# Patient Record
Sex: Male | Born: 1945
Health system: Southern US, Community
[De-identification: ages and names within clinical notes are randomized; demographics above are authoritative.]

## PROBLEM LIST (undated history)

## (undated) DIAGNOSIS — E119 Type 2 diabetes mellitus without complications: Secondary | ICD-10-CM

## (undated) DIAGNOSIS — Z86010 Personal history of colonic polyps: Principal | ICD-10-CM

## (undated) DIAGNOSIS — G8929 Other chronic pain: Secondary | ICD-10-CM

## (undated) DIAGNOSIS — M5441 Lumbago with sciatica, right side: Secondary | ICD-10-CM

## (undated) DIAGNOSIS — C4491 Basal cell carcinoma of skin, unspecified: Secondary | ICD-10-CM

## (undated) DIAGNOSIS — I10 Essential (primary) hypertension: Secondary | ICD-10-CM

## (undated) DIAGNOSIS — G459 Transient cerebral ischemic attack, unspecified: Secondary | ICD-10-CM

## (undated) DIAGNOSIS — K219 Gastro-esophageal reflux disease without esophagitis: Secondary | ICD-10-CM

## (undated) DIAGNOSIS — D049 Carcinoma in situ of skin, unspecified: Secondary | ICD-10-CM

## (undated) DIAGNOSIS — Z9889 Other specified postprocedural states: Secondary | ICD-10-CM

## (undated) DIAGNOSIS — M5442 Lumbago with sciatica, left side: Secondary | ICD-10-CM

## (undated) HISTORY — DX: Type 2 diabetes mellitus without complications: E11.9

## (undated) HISTORY — DX: Lumbago with sciatica, left side: M54.42

## (undated) HISTORY — PX: TOTAL HIP ARTHROPLASTY: SHX124

## (undated) HISTORY — PX: BASAL CELL CARCINOMA EXCISION: SHX1214

## (undated) HISTORY — PX: ROTATOR CUFF REPAIR: SHX139

## (undated) HISTORY — DX: Carcinoma in situ of skin, unspecified: D04.9

## (undated) HISTORY — DX: Other chronic pain: G89.29

## (undated) HISTORY — DX: Lumbago with sciatica, right side: M54.41

## (undated) HISTORY — PX: POLYPECTOMY: SHX149

## (undated) HISTORY — DX: Basal cell carcinoma of skin, unspecified: C44.91

## (undated) HISTORY — DX: Transient cerebral ischemic attack, unspecified: G45.9

## (undated) HISTORY — DX: Essential (primary) hypertension: I10

## (undated) HISTORY — DX: Personal history of colonic polyps: Z86.010

## (undated) HISTORY — DX: Gastro-esophageal reflux disease without esophagitis: K21.9

## (undated) HISTORY — DX: Other specified postprocedural states: Z98.890

## (undated) HISTORY — PX: CARPAL TUNNEL RELEASE: SHX101

---
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
## 2005-06-13 ENCOUNTER — Ambulatory Visit: Payer: Self-pay | Admitting: Family Medicine

## 2007-07-12 ENCOUNTER — Ambulatory Visit: Payer: Self-pay | Admitting: Orthopaedic Surgery

## 2011-09-14 ENCOUNTER — Encounter: Payer: Self-pay | Admitting: Family Medicine

## 2011-09-14 ENCOUNTER — Ambulatory Visit (INDEPENDENT_AMBULATORY_CARE_PROVIDER_SITE_OTHER): Payer: Medicare Other | Admitting: Family Medicine

## 2011-09-14 VITALS — BP 120/68 | HR 87 | Temp 98.6°F | Ht 71.0 in | Wt 211.0 lb

## 2011-09-14 DIAGNOSIS — K219 Gastro-esophageal reflux disease without esophagitis: Secondary | ICD-10-CM

## 2011-09-14 DIAGNOSIS — Z9889 Other specified postprocedural states: Secondary | ICD-10-CM

## 2011-09-14 DIAGNOSIS — D049 Carcinoma in situ of skin, unspecified: Secondary | ICD-10-CM

## 2011-09-14 NOTE — Progress Notes (Signed)
Nature conservation officer at [facility] [address] [place] [address] Phone: [phone] Fax: [phone]  Date:  09/14/2011   Name:  Xavier Perry   DOB:  July 16, 1945   MRN:  [id number] Gender: male Age: 66 y.o.  PCP:  Hannah Beat, MD    Chief Complaint: Establish Care   History of Present Illness:  Xavier Perry is a 66 y.o. pleasant patient who presents with the following:  Left shoulder, s/p RTC and RTC revision.  GERD, stable and well controlled on Omeprazole  Remote h/o SCC of the skin  Patient Active Problem List  Diagnosis  . GERD (gastroesophageal reflux disease)  . History of repair of rotator cuff  . Squamous cell carcinoma in situ of skin    Past Medical History  Diagnosis Date  . GERD (gastroesophageal reflux disease) 09/19/2011  . History of repair of rotator cuff 09/19/2011    L 2009 and 2010  . Squamous cell carcinoma in situ of skin 09/19/2011    Past Surgical History  Procedure Date  . Rotator cuff repair 2009 and 2010    left rotator cuff in 2009 right in 2010  . Tonsilectomy, adenoidectomy, bilateral myringotomy and tubes 1967    History  Substance Use Topics  . Smoking status: Former Games developer  . Smokeless tobacco: Not on file  . Alcohol Use: Yes     occasional     No family history on file.  No Known Allergies  Current Outpatient Prescriptions on File Prior to Visit  Medication Sig Dispense Refill  . omeprazole (PRILOSEC) 20 MG capsule Take 20 mg by mouth daily.         Review of Systems:  Doesn't sleep all that well. Sometimes will wake up and get up five or six ocklock, will get five and a half hours o fseep a night. In the last month or so.     Physical Examination: Filed Vitals:   09/14/11 1407  BP: 120/68  Pulse: 87  Temp: 98.6 F (37 C)   Filed Vitals:   09/14/11 1407  Height: 5\' 11"  (1.803 m)  Weight: 211 lb (95.709 kg)   Body mass index is 29.43 kg/(m^2). Ideal Body Weight: Weight in (lb) to have BMI = 25:  178.9    GEN: WDWN, NAD, Non-toxic, A & O x 3 HEENT: Atraumatic, Normocephalic. Neck supple. No masses, No LAD. Ears and Nose: No external deformity. CV: RRR, No M/G/R. No JVD. No thrill. No extra heart sounds. PULM: CTA B, no wheezes, crackles, rhonchi. No retractions. No resp. distress. No accessory muscle use. EXTR: No c/c/e NEURO Normal gait.  PSYCH: Normally interactive. Conversant. Not depressed or anxious appearing.  Calm demeanor.    Assessment and Plan:  1. GERD (gastroesophageal reflux disease) , stable  2. History of repair of rotator cuff   3. Squamous cell carcinoma in situ of skin , stable    Orders Today:  No orders of the defined types were placed in this encounter.    Updated Medication List: (Includes new medications, updates to list, dose adjustments) Outpatient Encounter Prescriptions as of 09/14/2011  Medication Sig Dispense Refill  . aspirin 325 MG tablet Take 325 mg by mouth daily.      . Multiple Vitamin (MULTIVITAMIN) tablet Take 1 tablet by mouth daily.      . naproxen sodium (ANAPROX) 220 MG tablet Take 220 mg by mouth as needed.      Marland Kitchen omeprazole (PRILOSEC) 20 MG capsule Take 20 mg  by mouth daily.        Medications Discontinued: There are no discontinued medications.   Hannah Beat, MD

## 2011-09-19 ENCOUNTER — Encounter: Payer: Self-pay | Admitting: Family Medicine

## 2011-09-19 DIAGNOSIS — D049 Carcinoma in situ of skin, unspecified: Secondary | ICD-10-CM

## 2011-09-19 DIAGNOSIS — K219 Gastro-esophageal reflux disease without esophagitis: Secondary | ICD-10-CM | POA: Insufficient documentation

## 2011-09-19 DIAGNOSIS — Z9889 Other specified postprocedural states: Secondary | ICD-10-CM | POA: Insufficient documentation

## 2011-09-19 HISTORY — DX: Gastro-esophageal reflux disease without esophagitis: K21.9

## 2011-09-19 HISTORY — DX: Other specified postprocedural states: Z98.890

## 2011-09-19 HISTORY — DX: Carcinoma in situ of skin, unspecified: D04.9

## 2012-01-12 ENCOUNTER — Ambulatory Visit (INDEPENDENT_AMBULATORY_CARE_PROVIDER_SITE_OTHER): Payer: Medicare Other | Admitting: Family Medicine

## 2012-01-12 ENCOUNTER — Encounter: Payer: Self-pay | Admitting: Family Medicine

## 2012-01-12 ENCOUNTER — Ambulatory Visit (INDEPENDENT_AMBULATORY_CARE_PROVIDER_SITE_OTHER)
Admission: RE | Admit: 2012-01-12 | Discharge: 2012-01-12 | Disposition: A | Discharge status: Home / Self Care | Payer: Medicare Other | Source: Ambulatory Visit | Attending: Family Medicine | Admitting: Family Medicine

## 2012-01-12 VITALS — BP 140/80 | HR 95 | Temp 98.0°F | Ht 71.0 in | Wt 218.2 lb

## 2012-01-12 DIAGNOSIS — M25569 Pain in unspecified knee: Secondary | ICD-10-CM

## 2012-01-12 DIAGNOSIS — M25562 Pain in left knee: Secondary | ICD-10-CM

## 2012-01-12 DIAGNOSIS — M1710 Unilateral primary osteoarthritis, unspecified knee: Secondary | ICD-10-CM

## 2012-01-12 MED ORDER — DICLOFENAC SODIUM 75 MG PO TBEC: 75.0000 mg | DELAYED_RELEASE_TABLET | Freq: Two times a day (BID) | ORAL | Status: DC

## 2012-01-12 NOTE — Patient Instructions (Signed)
F/u with me (CPX) June 2014

## 2012-01-12 NOTE — Progress Notes (Signed)
Nature conservation officer at [facility] [address] [place] [address] Phone: [phone] Fax: [phone]  Date:  01/12/2012   Name:  Xavier Perry   DOB:  March 10, 1945   MRN:  [id number] Gender: male Age: 67 y.o.  PCP:  Hannah Beat, MD  Evaluating MD: Hannah Beat, MD   Chief Complaint: Knee Pain   History of Present Illness:  Xavier Perry is a 67 y.o. pleasant patient who presents with the following:  Left knee pain, started to bother him some in the middle part of knee Right knee, had an ACL tear and had some injection that made it better  3-4 week history of left-sided knee pain. He is a very active gentleman and works out routinely and he often also plays golf several times a week. Right now he is not been able to play. He has been taking some Aleve intermittently without much success. He has a mild effusion. A symptomatic giving way. No locking up of the joint. No prior operative intervention. No prior fractures. No no significant acute trauma or injury.  Patient Active Problem List  Diagnosis  . GERD (gastroesophageal reflux disease)  . History of repair of rotator cuff  . Squamous cell carcinoma in situ of skin    Past Medical History  Diagnosis Date  . GERD (gastroesophageal reflux disease) 09/19/2011  . History of repair of rotator cuff 09/19/2011    L 2009 and 2010  . Squamous cell carcinoma in situ of skin 09/19/2011    Past Surgical History  Procedure Date  . Rotator cuff repair 2009 and 2010    left rotator cuff in 2009 right in 2010  . Tonsilectomy, adenoidectomy, bilateral myringotomy and tubes 1967    History  Substance Use Topics  . Smoking status: Former Games developer  . Smokeless tobacco: Not on file  . Alcohol Use: Yes     Comment: occasional     No family history on file.  No Known Allergies  Medication list has been reviewed and updated.  Outpatient Prescriptions Prior to Visit  Medication Sig Dispense Refill  . aspirin 325 MG tablet  Take 325 mg by mouth daily.      . Multiple Vitamin (MULTIVITAMIN) tablet Take 1 tablet by mouth daily.      . naproxen sodium (ANAPROX) 220 MG tablet Take 220 mg by mouth as needed.      Marland Kitchen omeprazole (PRILOSEC) 20 MG capsule Take 20 mg by mouth daily.       Last reviewed on 01/12/2012  3:17 PM by Consuello Masse, CMA  Review of Systems:   GEN: No fevers, chills. Nontoxic. Primarily MSK c/o today. MSK: Detailed in the HPI GI: tolerating PO intake without difficulty Neuro: No numbness, parasthesias, or tingling associated. Otherwise the pertinent positives of the ROS are noted above.    Physical Examination: BP 140/80  Pulse 95  Temp 98 F (36.7 C) (Oral)  Ht 5\' 11"  (1.803 m)  Wt 218 lb 4 oz (98.998 kg)  BMI 30.44 kg/m2  SpO2 98%  Ideal Body Weight: Weight in (lb) to have BMI = 25: 178.9    GEN: WDWN, NAD, Non-toxic, Alert & Oriented x 3 HEENT: Atraumatic, Normocephalic.  Ears and Nose: No external deformity. EXTR: No clubbing/cyanosis/edema NEURO: Normal gait.  PSYCH: Normally interactive. Conversant. Not depressed or anxious appearing.  Calm demeanor.   LEFT knee: Full extension and flexion to 125. Mild tenderness with patellar compression and with medial lateral patellar facet loading. Mild  to moderate tenderness on the medial joint line. Stable varus and valgus stress. Negative Lachman. Negative anterior and posterior drawer testing. Negative McMurray's. Flexion pinch testing is negative, but mildly positive, meaning mildly tender. Bounce home test is negative.  Dg Knee Ap/lat W/sunrise Left  01/12/2012  *RADIOLOGY REPORT*  Clinical Data:  left knee pain  DG KNEE - 3 VIEWS  Comparison: None.  Findings: Three views of the left knee submitted.  No acute fracture or subluxation.  Minimal spurring of femoral condyles.  No joint effusion.  Narrowing of patellofemoral joint space. Small area focal sclerosis in proximal tibia may be due to a bone island or prior avascular necrosis.   IMPRESSION:  No acute fracture or subluxation.  Minimal spurring of femoral condyles.  No joint effusion.  Narrowing of patellofemoral joint space. Small area focal sclerosis in proximal tibia may be due to a bone island or prior avascular necrosis.   Original Report Authenticated By: Natasha Mead, M.D.     Assessment and Plan:  1. Osteoarthritis, knee    2. Left knee pain  DG Knee AP/LAT W/Sunrise Left   X-rays: AP Bilateral Weight-bearing, Weightbearing Lateral, Sunrise views Indication: knee pain Findings:  Minimal to mild tricompartmental osteoarthritic change with some minimal degree of spurring. Overall, joint spaces are relatively well preserved. No evidence of occult fracture.  Probable OA exacerbation on the LEFT. Failure with some initial NSAIDs. We will change classes and start some Voltaren orally, ice after playing golf and inject knee now.  Knee Injection, LEFT Patient verbally consented to procedure. Risks (including potential rare risk of infection), benefits, and alternatives explained. Sterilely prepped with Chloraprep. Ethyl cholride used for anesthesia. 8 cc Lidocaine 1% mixed with 2 cc of Depo-Medrol 40 mg injected using the anterolateral approach without difficulty. No complications with procedure and tolerated well. Patient had decreased pain post-injection.   Orders Today:  Orders Placed This Encounter  Procedures  . DG Knee AP/LAT W/Sunrise Left    Standing Status: Future     Number of Occurrences: 1     Standing Expiration Date: 03/13/2013    Order Specific Question:  Reason for exam:    Answer:  left knee pain, OA?    Order Specific Question:  Preferred imaging location?    Answer:  [facility]    Updated Medication List: (Includes new medications, updates to list, dose adjustments) Meds ordered this encounter  Medications  . diclofenac (VOLTAREN) 75 MG EC tablet    Sig: Take 1 tablet (75 mg total) by mouth 2 (two) times daily.    Dispense:  60  tablet    Refill:  3    Medications Discontinued: There are no discontinued medications.   Hannah Beat, MD

## 2012-07-09 ENCOUNTER — Other Ambulatory Visit: Payer: Self-pay | Admitting: Family Medicine

## 2012-07-09 DIAGNOSIS — Z13220 Encounter for screening for lipoid disorders: Secondary | ICD-10-CM

## 2012-07-09 DIAGNOSIS — N400 Enlarged prostate without lower urinary tract symptoms: Secondary | ICD-10-CM

## 2012-07-09 DIAGNOSIS — Z79899 Other long term (current) drug therapy: Secondary | ICD-10-CM

## 2012-07-10 ENCOUNTER — Other Ambulatory Visit (INDEPENDENT_AMBULATORY_CARE_PROVIDER_SITE_OTHER): Payer: Medicare Other

## 2012-07-10 DIAGNOSIS — N400 Enlarged prostate without lower urinary tract symptoms: Secondary | ICD-10-CM

## 2012-07-10 DIAGNOSIS — Z125 Encounter for screening for malignant neoplasm of prostate: Secondary | ICD-10-CM

## 2012-07-10 DIAGNOSIS — Z79899 Other long term (current) drug therapy: Secondary | ICD-10-CM

## 2012-07-10 DIAGNOSIS — Z13220 Encounter for screening for lipoid disorders: Secondary | ICD-10-CM

## 2012-07-10 LAB — CBC WITH DIFFERENTIAL/PLATELET
Eosinophils Relative: 3.2 % (ref 0.0–5.0)
HCT: 44 % (ref 39.0–52.0)
Hemoglobin: 14.8 g/dL (ref 13.0–17.0)
Lymphs Abs: 2.3 10*3/uL (ref 0.7–4.0)
Monocytes Relative: 8.6 % (ref 3.0–12.0)
Neutro Abs: 4.1 10*3/uL (ref 1.4–7.7)
RBC: 4.83 Mil/uL (ref 4.22–5.81)
WBC: 7.4 10*3/uL (ref 4.5–10.5)

## 2012-07-10 LAB — LIPID PANEL
Cholesterol: 213 mg/dL — ABNORMAL HIGH (ref 0–200)
HDL: 56.7 mg/dL (ref >39.00)
Total CHOL/HDL Ratio: 4
Triglycerides: 182 mg/dL — ABNORMAL HIGH (ref 0.0–149.0)
VLDL: 36.4 mg/dL (ref 0.0–40.0)

## 2012-07-10 LAB — BASIC METABOLIC PANEL
CO2: 27 mEq/L (ref 19–32)
Calcium: 9.6 mg/dL (ref 8.4–10.5)
Chloride: 105 mEq/L (ref 96–112)
Creatinine, Ser: 0.9 mg/dL (ref 0.4–1.5)
Glucose, Bld: 111 mg/dL — ABNORMAL HIGH (ref 70–99)

## 2012-07-10 LAB — HEPATIC FUNCTION PANEL: Albumin: 4.1 g/dL (ref 3.5–5.2)

## 2012-07-18 ENCOUNTER — Encounter: Payer: Self-pay | Admitting: Family Medicine

## 2012-07-18 ENCOUNTER — Ambulatory Visit (INDEPENDENT_AMBULATORY_CARE_PROVIDER_SITE_OTHER): Payer: Medicare Other | Admitting: Family Medicine

## 2012-07-18 ENCOUNTER — Encounter: Payer: Self-pay | Admitting: Internal Medicine

## 2012-07-18 VITALS — BP 120/82 | HR 91 | Temp 98.2°F | Ht 71.0 in | Wt 208.0 lb

## 2012-07-18 DIAGNOSIS — Z0000 Encounter for general adult medical examination without abnormal findings: Secondary | ICD-10-CM

## 2012-07-18 DIAGNOSIS — Z1211 Encounter for screening for malignant neoplasm of colon: Secondary | ICD-10-CM

## 2012-07-18 DIAGNOSIS — Z2911 Encounter for prophylactic immunotherapy for respiratory syncytial virus (RSV): Secondary | ICD-10-CM

## 2012-07-18 DIAGNOSIS — Z23 Encounter for immunization: Secondary | ICD-10-CM

## 2012-07-18 MED ORDER — OMEPRAZOLE 20 MG PO CPDR: 20.0000 mg | DELAYED_RELEASE_CAPSULE | Freq: Every day | ORAL | Status: DC

## 2012-07-18 NOTE — Progress Notes (Signed)
Nature conservation officer at [facility] [address] [place] [address] Phone: [phone] Fax: [phone]  Date:  07/18/2012   Name:  Xavier Perry   DOB:  1945/04/13   MRN:  [id number] Gender: male Age: 67 y.o.  Primary Physician:  Hannah Beat, MD  Evaluating MD: Hannah Beat, MD   Chief Complaint: Annual Exam   History of Present Illness:  Xavier Perry is a 67 y.o. pleasant patient who presents with the following:  Medicare CPX:  Pneumovax Zostavax  Basically healthy and feeling well.  Preventative Health Maintenance Visit:  Health Maintenance Summary Reviewed and updated, unless pt declines services.  Tobacco History Reviewed. Alcohol: No concerns, no excessive use Exercise Habits: Some activity, rec at least 30 mins 5 times a week STD concerns: no risk or activity to increase risk Drug Use: None Encouraged self-testicular check  Health Maintenance  Topic Date Due  . Tetanus/tdap  08/09/1965  . Colonoscopy  08/11/2012  . Influenza Vaccine  09/03/2012  . Pneumococcal Polysaccharide Vaccine Age 17 And Over  Completed  . Zostavax  Completed    Labs reviewed with the patient.  Results for orders placed in visit on 07/10/12  LIPID PANEL      Result Value Range   Cholesterol 213 (*) 0 - 200 mg/dL   Triglycerides 010.2 (*) 0.0 - 149.0 mg/dL   HDL 72.53  >66.44 mg/dL   VLDL 03.4  0.0 - 74.2 mg/dL   Total CHOL/HDL Ratio 4    HEPATIC FUNCTION PANEL      Result Value Range   Total Bilirubin 0.7  0.3 - 1.2 mg/dL   Bilirubin, Direct 0.1  0.0 - 0.3 mg/dL   Alkaline Phosphatase 118 (*) 39 - 117 U/L   AST 29  0 - 37 U/L   ALT 35  0 - 53 U/L   Total Protein 7.1  6.0 - 8.3 g/dL   Albumin 4.1  3.5 - 5.2 g/dL  CBC WITH DIFFERENTIAL      Result Value Range   WBC 7.4  4.5 - 10.5 K/uL   RBC 4.83  4.22 - 5.81 Mil/uL   Hemoglobin 14.8  13.0 - 17.0 g/dL   HCT 59.5  63.8 - 75.6 %   MCV 91.3  78.0 - 100.0 fl   MCHC 33.6  30.0 - 36.0 g/dL   RDW 43.3  29.5 -  18.8 %   Platelets 250.0  150.0 - 400.0 K/uL   Neutrophils Relative % 55.8  43.0 - 77.0 %   Lymphocytes Relative 31.3  12.0 - 46.0 %   Monocytes Relative 8.6  3.0 - 12.0 %   Eosinophils Relative 3.2  0.0 - 5.0 %   Basophils Relative 1.1  0.0 - 3.0 %   Neutro Abs 4.1  1.4 - 7.7 K/uL   Lymphs Abs 2.3  0.7 - 4.0 K/uL   Monocytes Absolute 0.6  0.1 - 1.0 K/uL   Eosinophils Absolute 0.2  0.0 - 0.7 K/uL   Basophils Absolute 0.1  0.0 - 0.1 K/uL  BASIC METABOLIC PANEL      Result Value Range   Sodium 140  135 - 145 mEq/L   Potassium 5.2 (*) 3.5 - 5.1 mEq/L   Chloride 105  96 - 112 mEq/L   CO2 27  19 - 32 mEq/L   Glucose, Bld 111 (*) 70 - 99 mg/dL   BUN 15  6 - 23 mg/dL   Creatinine, Ser 0.9  0.4 - 1.5 mg/dL  Calcium 9.6  8.4 - 10.5 mg/dL   GFR 62.13  >08.65 mL/min  PSA, MEDICARE      Result Value Range   PSA 0.29  0.10 - 4.00 ng/ml  LDL CHOLESTEROL, DIRECT      Result Value Range   Direct LDL 150.0       Patient Active Problem List   Diagnosis Date Noted  . GERD (gastroesophageal reflux disease) 09/19/2011  . History of repair of rotator cuff 09/19/2011  . Squamous cell carcinoma in situ of skin 09/19/2011    Past Medical History  Diagnosis Date  . GERD (gastroesophageal reflux disease) 09/19/2011  . History of repair of rotator cuff 09/19/2011    L 2009 and 2010  . Squamous cell carcinoma in situ of skin 09/19/2011    Past Surgical History  Procedure Laterality Date  . Rotator cuff repair  2009 and 2010    left rotator cuff in 2009 right in 2010  . Tonsilectomy, adenoidectomy, bilateral myringotomy and tubes  1967    History   Social History  . Marital Status: Married    Spouse Name: N/A    Number of Children: N/A  . Years of Education: N/A   Occupational History  . Not on file.   Social History Main Topics  . Smoking status: Former Games developer  . Smokeless tobacco: Never Used  . Alcohol Use: Yes     Comment: occasional   . Drug Use: No  . Sexually Active: Not  on file   Other Topics Concern  . Not on file   Social History Narrative  . No narrative on file    No family history on file.  No Known Allergies  Medication list has been reviewed and updated.  Outpatient Prescriptions Prior to Visit  Medication Sig Dispense Refill  . omeprazole (PRILOSEC) 20 MG capsule Take 20 mg by mouth daily.      Marland Kitchen aspirin 325 MG tablet Take 325 mg by mouth daily.      . diclofenac (VOLTAREN) 75 MG EC tablet Take 1 tablet (75 mg total) by mouth 2 (two) times daily.  60 tablet  3  . Multiple Vitamin (MULTIVITAMIN) tablet Take 1 tablet by mouth daily.      . naproxen sodium (ANAPROX) 220 MG tablet Take 220 mg by mouth as needed.       No facility-administered medications prior to visit.    Review of Systems:   General: Denies fever, chills, sweats. No significant weight loss. Eyes: Denies blurring,significant itching ENT: Denies earache, sore throat, and hoarseness. Cardiovascular: Denies chest pains, palpitations, dyspnea on exertion Respiratory: Denies cough, dyspnea at rest,wheeezing Breast: no concerns about lumps GI: Denies nausea, vomiting, diarrhea, constipation, change in bowel habits, abdominal pain, melena, hematochezia GU: Denies penile discharge, ED, urinary flow / outflow problems. No STD concerns. Musculoskeletal: Denies back pain, joint pain Derm: Denies rash, itching Neuro: Denies  paresthesias, frequent falls, frequent headaches Psych: Denies depression, anxiety Endocrine: Denies cold intolerance, heat intolerance, polydipsia Heme: Denies enlarged lymph nodes Allergy: No hayfever  Physical Examination: BP 120/82  Pulse 91  Temp(Src) 98.2 F (36.8 C) (Oral)  Ht 5\' 11"  (1.803 m)  Wt 208 lb (94.348 kg)  BMI 29.02 kg/m2  SpO2 97%  Ideal Body Weight: Weight in (lb) to have BMI = 25: 178.9   Wt Readings from Last 3 Encounters:  07/18/12 208 lb (94.348 kg)  01/12/12 218 lb 4 oz (98.998 kg)  09/14/11 211 lb (95.709 kg)  GEN: well developed, well nourished, no acute distress Eyes: conjunctiva and lids normal, PERRLA, EOMI ENT: TM clear, nares clear, oral exam WNL Neck: supple, no lymphadenopathy, no thyromegaly, no JVD Pulm: clear to auscultation and percussion, respiratory effort normal CV: regular rate and rhythm, S1-S2, no murmur, rub or gallop, no bruits, peripheral pulses normal and symmetric, no cyanosis, clubbing, edema or varicosities Chest: no scars, masses GI: soft, non-tender; no hepatosplenomegaly, masses; active bowel sounds all quadrants GU: no hernia, testicular mass, penile discharge, or prostate enlargement Lymph: no cervical, axillary or inguinal adenopathy MSK: gait normal, muscle tone and strength WNL, no joint swelling, effusions, discoloration, crepitus  SKIN: clear, good turgor, color WNL, no rashes, lesions, or ulcerations Neuro: normal mental status, normal strength, sensation, and motion Psych: alert; oriented to person, place and time, normally interactive and not anxious or depressed in appearance.  Assessment and Plan:  Routine general medical examination at a health care facility  Special screening for malignant neoplasms, colon - Plan: Ambulatory referral to Gastroenterology  Immunization due - Plan: Varicella-zoster vaccine subcutaneous, Pneumococcal polysaccharide vaccine 23-valent greater than or equal to 2yo subcutaneous/IM  I have personally reviewed the Medicare Annual Wellness questionnaire and have noted 1. The patient's medical and social history 2. Their use of alcohol, tobacco or illicit drugs 3. Their current medications and supplements 4. The patient's functional ability including ADL's, fall risks, home safety risks and hearing or visual             impairment. 5. Diet and physical activities 6. Evidence for depression or mood disorders  The patients weight, height, BMI and visual acuity have been recorded in the chart I have made referrals,  counseling and provided education to the patient based review of the above and I have provided the pt with a written personalized care plan for preventive services.  I have provided the patient with a copy of your personalized plan for preventive services. Instructed to take the time to review along with their updated medication list.   Update vaccines Refer for colon  Orders Today:  Orders Placed This Encounter  Procedures  . Varicella-zoster vaccine subcutaneous  . Pneumococcal polysaccharide vaccine 23-valent greater than or equal to 2yo subcutaneous/IM  . Ambulatory referral to Gastroenterology    Referral Priority:  Routine    Referral Type:  Consultation    Referral Reason:  Specialty Services Required    Requested Specialty:  Gastroenterology    Number of Visits Requested:  1    Updated Medication List: (Includes new medications, updates to list, dose adjustments) Meds ordered this encounter  Medications  . omeprazole (PRILOSEC) 20 MG capsule    Sig: Take 1 capsule (20 mg total) by mouth daily.    Dispense:  90 capsule    Refill:  3    Medications Discontinued: Medications Discontinued During This Encounter  Medication Reason  . naproxen sodium (ANAPROX) 220 MG tablet Error  . Multiple Vitamin (MULTIVITAMIN) tablet Error  . diclofenac (VOLTAREN) 75 MG EC tablet Error  . aspirin 325 MG tablet Error  . omeprazole (PRILOSEC) 20 MG capsule Reorder      Signed, Karleen Hampshire T. Namari Breton, MD 07/18/2012 2:56 PM

## 2012-07-18 NOTE — Patient Instructions (Addendum)
REFERRAL: GO THE THE FRONT ROOM AT THE ENTRANCE OF OUR CLINIC, NEAR CHECK IN. ASK FOR Xavier Perry. SHE WILL HELP YOU SET UP YOUR REFERRAL. DATE: TIME:  

## 2012-08-23 ENCOUNTER — Telehealth: Payer: Self-pay | Admitting: *Deleted

## 2012-08-23 ENCOUNTER — Ambulatory Visit (AMBULATORY_SURGERY_CENTER): Payer: Medicare Other | Admitting: *Deleted

## 2012-08-23 VITALS — Ht 71.0 in | Wt 207.8 lb

## 2012-08-23 DIAGNOSIS — Z86010 Personal history of colonic polyps: Secondary | ICD-10-CM

## 2012-08-23 MED ORDER — PREPOPIK 10-3.5-12 MG-GM-GM PO PACK: 1.0000 | PACK | Freq: Once | ORAL | Status: DC

## 2012-08-23 NOTE — Telephone Encounter (Signed)
Dr. Leone Payor: pt scheduled for colonoscopy Thursday 8/28.  Last colonoscopy 08/03/2010 at [facility].  Pt brought reports  for review.  Pt had numerous polyps; adenomatous and hyperplastic.  Recall suggested 1 to 2 years.  I have placed reports on your desk for review. Is pt due now?  Thanks, Olegario Messier.

## 2012-08-23 NOTE — Progress Notes (Signed)
No allergies to eggs or soy. No problems with anesthesia.  

## 2012-08-23 NOTE — Telephone Encounter (Signed)
Colonoscopy now is appropriate

## 2012-08-24 NOTE — Telephone Encounter (Signed)
LMOM for pt to keep his colonoscopy as scheduled- ok on contact info to leave message on machine

## 2012-08-24 NOTE — Telephone Encounter (Signed)
noted 

## 2012-08-30 ENCOUNTER — Ambulatory Visit (AMBULATORY_SURGERY_CENTER): Payer: Medicare Other | Admitting: Internal Medicine

## 2012-08-30 ENCOUNTER — Encounter: Payer: Self-pay | Admitting: Internal Medicine

## 2012-08-30 VITALS — BP 111/56 | HR 56 | Temp 97.6°F | Resp 19 | Ht 71.0 in | Wt 207.0 lb

## 2012-08-30 DIAGNOSIS — D126 Benign neoplasm of colon, unspecified: Secondary | ICD-10-CM

## 2012-08-30 DIAGNOSIS — Z860100 Personal history of colon polyps, unspecified: Secondary | ICD-10-CM

## 2012-08-30 HISTORY — DX: Personal history of colon polyps, unspecified: Z86.0100

## 2012-08-30 HISTORY — PX: COLONOSCOPY: SHX174

## 2012-08-30 HISTORY — DX: Personal history of colonic polyps: Z86.010

## 2012-08-30 MED ORDER — SODIUM CHLORIDE 0.9 % IV SOLN: 500.0000 mL | INTRAVENOUS | Status: DC

## 2012-08-30 NOTE — Progress Notes (Signed)
Called to room to assist during endoscopic procedure.  Patient ID and intended procedure confirmed with present staff. Received instructions for my participation in the procedure from the performing physician.  

## 2012-08-30 NOTE — Progress Notes (Signed)
Procedure ends, to recovery, report given and VSS. 

## 2012-08-30 NOTE — Patient Instructions (Addendum)
YOU HAD AN ENDOSCOPIC PROCEDURE TODAY AT THE [facility]: Refer to the procedure report that was given to you for any specific questions about what was found during the examination.  If the procedure report does not answer your questions, please call your gastroenterologist to clarify.  If you requested that your care partner not be given the details of your procedure findings, then the procedure report has been included in a sealed envelope for you to review at your convenience later.  YOU SHOULD EXPECT: Some feelings of bloating in the abdomen. Passage of more gas than usual.  Walking can help get rid of the air that was put into your GI tract during the procedure and reduce the bloating. If you had a lower endoscopy (such as a colonoscopy or flexible sigmoidoscopy) you may notice spotting of blood in your stool or on the toilet paper. If you underwent a bowel prep for your procedure, then you may not have a normal bowel movement for a few days.  DIET: Your first meal following the procedure should be a light meal and then it is ok to progress to your normal diet.  A half-sandwich or bowl of soup is an example of a good first meal.  Heavy or fried foods are harder to digest and may make you feel nauseous or bloated.  Likewise meals heavy in dairy and vegetables can cause extra gas to form and this can also increase the bloating.  Drink plenty of fluids but you should avoid alcoholic beverages for 24 hours.  ACTIVITY: Your care partner should take you home directly after the procedure.  You should plan to take it easy, moving slowly for the rest of the day.  You can resume normal activity the day after the procedure however you should NOT DRIVE or use heavy machinery for 24 hours (because of the sedation medicines used during the test).    SYMPTOMS TO REPORT IMMEDIATELY: A gastroenterologist can be reached at any hour.  During normal business hours, 8:30 AM to 5:00 PM Monday through Friday,  call [phone].  After hours and on weekends, please call the GI answering service at [phone] who will take a message and have the physician on call contact you.   Following lower endoscopy (colonoscopy or flexible sigmoidoscopy):  Excessive amounts of blood in the stool  Significant tenderness or worsening of abdominal pains  Swelling of the abdomen that is new, acute  Fever of 100F or higher    FOLLOW UP: If any biopsies were taken you will be contacted by phone or by letter within the next 1-3 weeks.  Call your gastroenterologist if you have not heard about the biopsies in 3 weeks.  Our staff will call the home number listed on your records the next business day following your procedure to check on you and address any questions or concerns that you may have at that time regarding the information given to you following your procedure. This is a courtesy call and so if there is no answer at the home number and we have not heard from you through the emergency physician on call, we will assume that you have returned to your regular daily activities without incident.  SIGNATURES/CONFIDENTIALITY: You and/or your care partner have signed paperwork which will be entered into your electronic medical record.  These signatures attest to the fact that that the information above on your After Visit Summary has been reviewed and is understood.  Full responsibility of the confidentiality   of this discharge information lies with you and/or your care-partner.     

## 2012-08-30 NOTE — Progress Notes (Addendum)
Patient did not have preoperative order for IV antibiotic SSI prophylaxis. (G8918)  Patient did not experience any of the following events: a burn prior to discharge; a fall within the facility; wrong site/side/patient/procedure/implant event; or a hospital transfer or hospital admission upon discharge from the facility. (G8907)  

## 2012-08-30 NOTE — Op Note (Signed)
 [facility] [address] [place], [address]   COLONOSCOPY PROCEDURE REPORT  PATIENT: Xavier Perry, Xavier Perry  MR#: [id number] BIRTHDATE: Aug 31, 1945 , 67  yrs. old GENDER: Male ENDOSCOPIST: Iva Boop, MD, [facility] REFERRED XB:[id number] Ward Chatters, M.D. PROCEDURE DATE:  08/30/2012 PROCEDURE:   Colonoscopy with snare polypectomy First Screening Colonoscopy - Avg.  risk and is 50 yrs.  old or older - No.  Prior Negative Screening - Now for repeat screening. N/A  History of Adenoma - Now for follow-up colonoscopy & has been > or = to 3 yrs.  No.  It has been less than 3 yrs since last colonoscopy.  Medical reason.  Polyps Removed Today? Yes. ASA CLASS:   Class II INDICATIONS:Patient's personal history of adenomatous colon polyps. 24 polyps, > 10 were adenomas in 2012 MEDICATIONS: Propofol (Diprivan) 270 mg IV, MAC sedation, administered by CRNA, and These medications were titrated to patient response per physician's verbal order  DESCRIPTION OF PROCEDURE:   After the risks benefits and alternatives of the procedure were thoroughly explained, informed consent was obtained.  A digital rectal exam revealed no abnormalities of the rectum, A digital rectal exam revealed the prostate was not enlarged, and A digital rectal exam revealed no prostatic nodules.   The LB [id number]  endoscope was introduced through the anus and advanced to the cecum, which was identified by both the appendix and ileocecal valve. No adverse events experienced.   The quality of the prep was Prepopik good The instrument was then slowly withdrawn as the colon was fully examined.   COLON FINDINGS: A sessile polyp measuring 6 mm in size was found in the ascending colon.  A polypectomy was performed with a cold snare.  The resection was complete and the polyp tissue was completely retrieved.   Moderate diverticulosis was noted in the sigmoid colon.   The colon mucosa was otherwise normal.   A  right colon retroflexion was performed.  Retroflexed views revealed no abnormalities. The time to cecum=1 minutes 15 seconds.  Withdrawal time=7 minutes 58 seconds.  The scope was withdrawn and the procedure completed. COMPLICATIONS: There were no complications.  ENDOSCOPIC IMPRESSION: 1.   Sessile polyp measuring 6 mm in size was found in the ascending colon; polypectomy was performed with a cold snare 2.   Moderate diverticulosis was noted in the sigmoid colon 3.   The colon mucosa was otherwise normal - good prep - hx numerous >10 adenomas 2012  RECOMMENDATIONS: Timing of repeat colonoscopy will be determined by pathology findings.   eSigned:  Iva Boop, MD, [facility] 08/30/2012 2:16 PM  cc: Juleen China, MD and The Patient

## 2012-08-31 ENCOUNTER — Telehealth: Payer: Self-pay

## 2012-08-31 NOTE — Telephone Encounter (Signed)
Left message for follow up call. 

## 2012-09-07 ENCOUNTER — Encounter: Payer: Self-pay | Admitting: Internal Medicine

## 2012-09-07 NOTE — Progress Notes (Signed)
Quick Note:  6 mm sessile serrated adenoma Hx numerous polyps 2012 Repeat colonoscopy 2016 ______

## 2013-01-07 DIAGNOSIS — M5137 Other intervertebral disc degeneration, lumbosacral region: Secondary | ICD-10-CM | POA: Diagnosis not present

## 2013-01-07 DIAGNOSIS — M5430 Sciatica, unspecified side: Secondary | ICD-10-CM | POA: Diagnosis not present

## 2013-01-07 DIAGNOSIS — M999 Biomechanical lesion, unspecified: Secondary | ICD-10-CM | POA: Diagnosis not present

## 2013-01-08 DIAGNOSIS — M999 Biomechanical lesion, unspecified: Secondary | ICD-10-CM | POA: Diagnosis not present

## 2013-01-08 DIAGNOSIS — M5430 Sciatica, unspecified side: Secondary | ICD-10-CM | POA: Diagnosis not present

## 2013-01-08 DIAGNOSIS — M5137 Other intervertebral disc degeneration, lumbosacral region: Secondary | ICD-10-CM | POA: Diagnosis not present

## 2013-01-10 DIAGNOSIS — M5137 Other intervertebral disc degeneration, lumbosacral region: Secondary | ICD-10-CM | POA: Diagnosis not present

## 2013-01-10 DIAGNOSIS — M5430 Sciatica, unspecified side: Secondary | ICD-10-CM | POA: Diagnosis not present

## 2013-01-10 DIAGNOSIS — M999 Biomechanical lesion, unspecified: Secondary | ICD-10-CM | POA: Diagnosis not present

## 2013-01-15 DIAGNOSIS — M5430 Sciatica, unspecified side: Secondary | ICD-10-CM | POA: Diagnosis not present

## 2013-01-15 DIAGNOSIS — M999 Biomechanical lesion, unspecified: Secondary | ICD-10-CM | POA: Diagnosis not present

## 2013-01-15 DIAGNOSIS — M5137 Other intervertebral disc degeneration, lumbosacral region: Secondary | ICD-10-CM | POA: Diagnosis not present

## 2013-01-17 DIAGNOSIS — M5430 Sciatica, unspecified side: Secondary | ICD-10-CM | POA: Diagnosis not present

## 2013-01-17 DIAGNOSIS — M5137 Other intervertebral disc degeneration, lumbosacral region: Secondary | ICD-10-CM | POA: Diagnosis not present

## 2013-01-17 DIAGNOSIS — M999 Biomechanical lesion, unspecified: Secondary | ICD-10-CM | POA: Diagnosis not present

## 2013-01-22 DIAGNOSIS — M5137 Other intervertebral disc degeneration, lumbosacral region: Secondary | ICD-10-CM | POA: Diagnosis not present

## 2013-01-22 DIAGNOSIS — M999 Biomechanical lesion, unspecified: Secondary | ICD-10-CM | POA: Diagnosis not present

## 2013-01-22 DIAGNOSIS — M5430 Sciatica, unspecified side: Secondary | ICD-10-CM | POA: Diagnosis not present

## 2013-01-24 DIAGNOSIS — M5137 Other intervertebral disc degeneration, lumbosacral region: Secondary | ICD-10-CM | POA: Diagnosis not present

## 2013-01-24 DIAGNOSIS — M999 Biomechanical lesion, unspecified: Secondary | ICD-10-CM | POA: Diagnosis not present

## 2013-01-24 DIAGNOSIS — M5430 Sciatica, unspecified side: Secondary | ICD-10-CM | POA: Diagnosis not present

## 2013-01-29 DIAGNOSIS — M999 Biomechanical lesion, unspecified: Secondary | ICD-10-CM | POA: Diagnosis not present

## 2013-01-29 DIAGNOSIS — M5430 Sciatica, unspecified side: Secondary | ICD-10-CM | POA: Diagnosis not present

## 2013-01-29 DIAGNOSIS — M5137 Other intervertebral disc degeneration, lumbosacral region: Secondary | ICD-10-CM | POA: Diagnosis not present

## 2013-01-31 DIAGNOSIS — M5137 Other intervertebral disc degeneration, lumbosacral region: Secondary | ICD-10-CM | POA: Diagnosis not present

## 2013-01-31 DIAGNOSIS — M5430 Sciatica, unspecified side: Secondary | ICD-10-CM | POA: Diagnosis not present

## 2013-01-31 DIAGNOSIS — M999 Biomechanical lesion, unspecified: Secondary | ICD-10-CM | POA: Diagnosis not present

## 2013-02-05 DIAGNOSIS — M5430 Sciatica, unspecified side: Secondary | ICD-10-CM | POA: Diagnosis not present

## 2013-02-05 DIAGNOSIS — M999 Biomechanical lesion, unspecified: Secondary | ICD-10-CM | POA: Diagnosis not present

## 2013-02-05 DIAGNOSIS — M5137 Other intervertebral disc degeneration, lumbosacral region: Secondary | ICD-10-CM | POA: Diagnosis not present

## 2013-02-07 DIAGNOSIS — M5430 Sciatica, unspecified side: Secondary | ICD-10-CM | POA: Diagnosis not present

## 2013-02-07 DIAGNOSIS — M999 Biomechanical lesion, unspecified: Secondary | ICD-10-CM | POA: Diagnosis not present

## 2013-02-07 DIAGNOSIS — M5137 Other intervertebral disc degeneration, lumbosacral region: Secondary | ICD-10-CM | POA: Diagnosis not present

## 2013-02-14 DIAGNOSIS — J019 Acute sinusitis, unspecified: Secondary | ICD-10-CM | POA: Diagnosis not present

## 2013-03-01 DIAGNOSIS — J019 Acute sinusitis, unspecified: Secondary | ICD-10-CM | POA: Diagnosis not present

## 2013-06-18 DIAGNOSIS — L570 Actinic keratosis: Secondary | ICD-10-CM | POA: Diagnosis not present

## 2013-06-18 DIAGNOSIS — Z1283 Encounter for screening for malignant neoplasm of skin: Secondary | ICD-10-CM | POA: Diagnosis not present

## 2013-06-18 DIAGNOSIS — Z85828 Personal history of other malignant neoplasm of skin: Secondary | ICD-10-CM | POA: Diagnosis not present

## 2013-07-04 ENCOUNTER — Other Ambulatory Visit: Payer: Self-pay | Admitting: Family Medicine

## 2013-07-04 DIAGNOSIS — Z79899 Other long term (current) drug therapy: Secondary | ICD-10-CM

## 2013-07-04 DIAGNOSIS — Z125 Encounter for screening for malignant neoplasm of prostate: Secondary | ICD-10-CM

## 2013-07-04 DIAGNOSIS — E7800 Pure hypercholesterolemia, unspecified: Secondary | ICD-10-CM

## 2013-07-04 DIAGNOSIS — Z13220 Encounter for screening for lipoid disorders: Secondary | ICD-10-CM

## 2013-07-11 ENCOUNTER — Ambulatory Visit: Payer: Medicare Other

## 2013-07-11 ENCOUNTER — Other Ambulatory Visit (INDEPENDENT_AMBULATORY_CARE_PROVIDER_SITE_OTHER): Payer: Medicare Other

## 2013-07-11 DIAGNOSIS — Z125 Encounter for screening for malignant neoplasm of prostate: Secondary | ICD-10-CM | POA: Diagnosis not present

## 2013-07-11 DIAGNOSIS — E7800 Pure hypercholesterolemia, unspecified: Secondary | ICD-10-CM

## 2013-07-11 DIAGNOSIS — Z79899 Other long term (current) drug therapy: Secondary | ICD-10-CM

## 2013-07-11 DIAGNOSIS — Z13220 Encounter for screening for lipoid disorders: Secondary | ICD-10-CM

## 2013-07-11 DIAGNOSIS — R7309 Other abnormal glucose: Secondary | ICD-10-CM

## 2013-07-11 LAB — CBC WITH DIFFERENTIAL/PLATELET
BASOS ABS: 0.1 10*3/uL (ref 0.0–0.1)
Basophils Relative: 0.6 % (ref 0.0–3.0)
Eosinophils Absolute: 0.2 10*3/uL (ref 0.0–0.7)
Eosinophils Relative: 2.5 % (ref 0.0–5.0)
HEMATOCRIT: 44.6 % (ref 39.0–52.0)
HEMOGLOBIN: 15 g/dL (ref 13.0–17.0)
LYMPHS ABS: 2.4 10*3/uL (ref 0.7–4.0)
Lymphocytes Relative: 27.2 % (ref 12.0–46.0)
MCHC: 33.7 g/dL (ref 30.0–36.0)
MCV: 90.2 fl (ref 78.0–100.0)
MONO ABS: 0.6 10*3/uL (ref 0.1–1.0)
Monocytes Relative: 6.8 % (ref 3.0–12.0)
NEUTROS ABS: 5.6 10*3/uL (ref 1.4–7.7)
Neutrophils Relative %: 62.9 % (ref 43.0–77.0)
PLATELETS: 249 10*3/uL (ref 150.0–400.0)
RBC: 4.94 Mil/uL (ref 4.22–5.81)
RDW: 13.3 % (ref 11.5–15.5)
WBC: 8.9 10*3/uL (ref 4.0–10.5)

## 2013-07-11 LAB — LIPID PANEL
Cholesterol: 211 mg/dL — ABNORMAL HIGH (ref 0–200)
HDL: 64.6 mg/dL (ref >39.00)
LDL Cholesterol: 128 mg/dL — ABNORMAL HIGH (ref 0–99)
NONHDL: 146.4
Total CHOL/HDL Ratio: 3
Triglycerides: 91 mg/dL (ref 0.0–149.0)
VLDL: 18.2 mg/dL (ref 0.0–40.0)

## 2013-07-11 LAB — BASIC METABOLIC PANEL
BUN: 13 mg/dL (ref 6–23)
CO2: 23 mEq/L (ref 19–32)
CREATININE: 0.9 mg/dL (ref 0.4–1.5)
Calcium: 9.6 mg/dL (ref 8.4–10.5)
Chloride: 103 mEq/L (ref 96–112)
GFR: 94.3 mL/min (ref >60.00)
Glucose, Bld: 128 mg/dL — ABNORMAL HIGH (ref 70–99)
Potassium: 4.9 mEq/L (ref 3.5–5.1)
SODIUM: 137 meq/L (ref 135–145)

## 2013-07-11 LAB — HEPATIC FUNCTION PANEL
ALK PHOS: 96 U/L (ref 39–117)
ALT: 25 U/L (ref 0–53)
AST: 26 U/L (ref 0–37)
Albumin: 4.2 g/dL (ref 3.5–5.2)
BILIRUBIN TOTAL: 0.8 mg/dL (ref 0.2–1.2)
Bilirubin, Direct: 0.2 mg/dL (ref 0.0–0.3)
Total Protein: 7.2 g/dL (ref 6.0–8.3)

## 2013-07-11 LAB — PSA, MEDICARE: PSA: 0.29 ng/ml (ref 0.10–4.00)

## 2013-07-11 LAB — HEMOGLOBIN A1C: Hgb A1c MFr Bld: 6.3 % (ref 4.6–6.5)

## 2013-07-18 ENCOUNTER — Other Ambulatory Visit: Payer: Medicare Other

## 2013-07-25 ENCOUNTER — Ambulatory Visit (INDEPENDENT_AMBULATORY_CARE_PROVIDER_SITE_OTHER): Payer: Medicare Other | Admitting: Family Medicine

## 2013-07-25 ENCOUNTER — Encounter: Payer: Self-pay | Admitting: Family Medicine

## 2013-07-25 VITALS — BP 120/70 | HR 87 | Temp 98.2°F | Ht 69.0 in | Wt 203.8 lb

## 2013-07-25 DIAGNOSIS — Z23 Encounter for immunization: Secondary | ICD-10-CM

## 2013-07-25 DIAGNOSIS — Z0000 Encounter for general adult medical examination without abnormal findings: Secondary | ICD-10-CM | POA: Diagnosis not present

## 2013-07-25 DIAGNOSIS — I83893 Varicose veins of bilateral lower extremities with other complications: Secondary | ICD-10-CM

## 2013-07-25 MED ORDER — OMEPRAZOLE 20 MG PO CPDR: 20.0000 mg | DELAYED_RELEASE_CAPSULE | Freq: Every day | ORAL | Status: DC

## 2013-07-25 NOTE — Progress Notes (Signed)
Pre visit review using our clinic review tool, if applicable. No additional management support is needed unless otherwise documented below in the visit note. 

## 2013-07-25 NOTE — Progress Notes (Signed)
Xavier Perry [address] Phone: [phone] Fax: [phone]  Patient ID: Xavier Perry MRN: [id number], DOB: 15-Aug-1945, 68 y.o. Date of Encounter: 07/25/2013  Primary Physician:  Xavier Loffler, MD   Chief Complaint: Annual Exam   Subjective:   History of Present Illness:  Xavier Perry is a 68 y.o. pleasant patient who presents with the following:  Preventative Health Maintenance Visit, Medicare wellness.   He is generally doing very well.   Health Maintenance Summary Reviewed and updated, unless pt declines services.  Tobacco History Reviewed. Alcohol: No concerns, no excessive use Exercise Habits: Routine, regular exercise STD concerns: no risk or activity to increase risk Drug Use: None Encouraged self-testicular check  Health Maintenance  Topic Date Due  . Tetanus/tdap  08/09/1965  . Influenza Vaccine  08/03/2013  . Colonoscopy  08/31/2014  . Pneumococcal Polysaccharide Vaccine Age 16 And Over  Completed  . Zostavax  Completed    Immunization History  Administered Date(s) Administered  . Pneumococcal Conjugate-13 07/25/2013  . Pneumococcal Polysaccharide-23 07/18/2012  . Zoster 07/18/2012   Just drove down to [place], went to [facility].   Golf trip, then went to the mountains.  Back problems in April. Went to the chiropractor.   Varicose veins.  Vascular referral to see Xavier Perry. Varicosities.   [id number]   Patient Active Problem List   Diagnosis Date Noted  . Personal history of colonic adenomas 08/30/2012  . GERD (gastroesophageal reflux disease) 09/19/2011  . History of repair of rotator cuff 09/19/2011  . Squamous cell carcinoma in situ of skin 09/19/2011   Past Medical History  Diagnosis Date  . GERD (gastroesophageal reflux disease) 09/19/2011  . History of repair of rotator cuff 09/19/2011    L 2009 and 2010  . Squamous cell carcinoma in situ of skin 09/19/2011  . Personal history of colonic adenomas 08/30/2012   Past  Surgical History  Procedure Laterality Date  . Rotator cuff repair Left 2009 and 2010  . Tonsilectomy, adenoidectomy, bilateral myringotomy and tubes  1967  . Colonoscopy     History   Social History  . Marital Status: Married    Spouse Name: N/A    Number of Children: N/A  . Years of Education: N/A   Occupational History  . Not on file.   Social History Main Topics  . Smoking status: Current Every Day Smoker -- 0.50 packs/day    Types: Cigarettes  . Smokeless tobacco: Never Used  . Alcohol Use: 3.3 oz/week    3 Glasses of wine, 3 Drinks containing 0.5 oz of alcohol per week     Comment: occasional   . Drug Use: No  . Sexual Activity: Not on file   Other Topics Concern  . Not on file   Social History Narrative  . No narrative on file   Family History  Problem Relation Age of Onset  . Colon cancer Neg Hx   . Esophageal cancer Neg Hx   . Rectal cancer Neg Hx   . Stomach cancer Neg Hx    No Known Allergies  Medication list has been reviewed and updated.  Review of Systems:  General: Denies fever, chills, sweats. No significant weight loss. Eyes: Denies blurring,significant itching ENT: Denies earache, sore throat, and hoarseness. Cardiovascular: Denies chest pains, palpitations, dyspnea on exertion, VARICOSITIES AS NOTED Respiratory: Denies cough, dyspnea at rest,wheeezing Breast: no concerns about lumps GI: Denies nausea, vomiting, diarrhea, constipation, change in bowel habits, abdominal pain, melena, hematochezia GU: Denies penile  discharge, ED, urinary flow / outflow problems. No STD concerns. Musculoskeletal: Denies back pain, joint pain Derm: Denies rash, itching Neuro: Denies  paresthesias, frequent falls, frequent headaches Psych: Denies depression, anxiety Endocrine: Denies cold intolerance, heat intolerance, polydipsia Heme: Denies enlarged lymph nodes Allergy: No hayfever  Objective:   Physical Examination: BP 120/70  Pulse 87  Temp(Src) 98.2  F (36.8 C) (Oral)  Ht _0  (1.753 m)  Wt 203 lb 12 oz (92.42 kg)  BMI 30.07 kg/m2 Ideal Body Weight: Weight in (lb) to have BMI = 25: 168.9  Hearing Screening Comments: Has Bilateral Hearing Aides Vision Screening Comments: Field seismologist at [facility] 03/23/2013   GEN: well developed, well nourished, no acute distress Eyes: conjunctiva and lids normal, PERRLA, EOMI ENT: TM clear, nares clear, oral exam WNL Neck: supple, no lymphadenopathy, no thyromegaly, no JVD Pulm: clear to auscultation and percussion, respiratory effort normal CV: regular rate and rhythm, S1-S2, no murmur, rub or gallop, no bruits, peripheral pulses normal and symmetric, no cyanosis, clubbing, edema. EXTENSIVE VARICOSITIES R > L LEG GI: soft, non-tender; no hepatosplenomegaly, masses; active bowel sounds all quadrants GU: no hernia, testicular mass, penile discharge Lymph: no cervical, axillary or inguinal adenopathy MSK: gait normal, muscle tone and strength WNL, no joint swelling, effusions, discoloration, crepitus  SKIN: clear, good turgor, color WNL, no rashes, lesions, or ulcerations Neuro: normal mental status, normal strength, sensation, and motion Psych: alert; oriented to person, place and time, normally interactive and not anxious or depressed in appearance.  All labs reviewed with patient.  Lipids:    Component Value Date/Time   CHOL 211* 07/11/2013 0840   TRIG 91.0 07/11/2013 0840   HDL 64.60 07/11/2013 0840   LDLDIRECT 150.0 07/10/2012 0847   VLDL 18.2 07/11/2013 0840   CHOLHDL 3 07/11/2013 0840   CBC: CBC Latest Ref Rng 07/11/2013 07/10/2012  WBC 4.0 - 10.5 K/uL 8.9 7.4  Hemoglobin 13.0 - 17.0 g/dL 15.0 14.8  Hematocrit 39.0 - 52.0 % 44.6 44.0  Platelets 150.0 - 400.0 K/uL 249.0 366.4    Basic Metabolic Panel:    Component Value Date/Time   NA 137 07/11/2013 0840   K 4.9 07/11/2013 0840   CL 103 07/11/2013 0840   CO2 23 07/11/2013 0840   BUN 13 07/11/2013 0840   CREATININE 0.9 07/11/2013  0840   GLUCOSE 128* 07/11/2013 0840   CALCIUM 9.6 07/11/2013 0840   Lab Results  Component Value Date   HGBA1C 6.3 07/11/2013     Hepatic Function Latest Ref Rng 07/11/2013 07/10/2012  Total Protein 6.0 - 8.3 g/dL 7.2 7.1  Albumin 3.5 - 5.2 g/dL 4.2 4.1  AST 0 - 37 U/L 26 29  ALT 0 - 53 U/L 25 35  Alk Phosphatase 39 - 117 U/L 96 118(H)  Total Bilirubin 0.2 - 1.2 mg/dL 0.8 0.7  Bilirubin, Direct 0.0 - 0.3 mg/dL 0.2 0.1    No results found for this basename: TSH   Lab Results  Component Value Date   PSA 0.29 07/11/2013   PSA 0.29 07/10/2012    Assessment & Plan:   Routine general medical examination at a health care facility  Varicose veins of lower extremities with other complications - Plan: Ambulatory referral to Vascular Surgery  Need for prophylactic vaccination against Streptococcus pneumoniae (pneumococcus) - Plan: Pneumococcal conjugate vaccine 13-valent  Health Maintenance Exam: The patient's preventative maintenance and recommended screening tests for an annual wellness exam were reviewed in full today. Brought up to date unless  services declined.  Counselled on the importance of diet, exercise, and its role in overall health and mortality. The patient's FH and SH was reviewed, including their home life, tobacco status, and drug and alcohol status.  I have personally reviewed the Medicare Annual Wellness questionnaire and have noted 1. The patient's medical and social history 2. Their use of alcohol, tobacco or illicit drugs 3. Their current medications and supplements 4. The patient's functional ability including ADL's, fall risks, home safety risks and hearing or visual             impairment. 5. Diet and physical activities 6. Evidence for depression or mood disorders  The patients weight, height, BMI and visual acuity have been recorded in the chart I have made referrals, counseling and provided education to the patient based review of the above and I have provided  the pt with a written personalized care plan for preventive services.  I have provided the patient with a copy of your personalized plan for preventive services. Instructed to take the time to review along with their updated medication list.   Overall, he is doing fine.  Varicose veins are painful almost every day, so we talked about options, and I am going to have him see VVS.  Follow-up: No Follow-up on file. Unless noted, follow-up in 1 year for Health Maintenance Exam.  New Prescriptions   No medications on file   Modified Medications   Modified Medication Previous Medication   OMEPRAZOLE (PRILOSEC) 20 MG CAPSULE omeprazole (PRILOSEC) 20 MG capsule      Take 1 capsule (20 mg total) by mouth daily.    Take 1 capsule (20 mg total) by mouth daily.   Orders Placed This Encounter  Procedures  . Pneumococcal conjugate vaccine 13-valent  . Ambulatory referral to Vascular Surgery    Signed,  Maud Deed. Nikeya Maxim, MD, CAQ Sports Medicine   Discontinued Medications   NAPROXEN SODIUM (ANAPROX) 220 MG TABLET    Take 220 mg by mouth 2 (two) times daily with a meal.   Current Medications at Discharge:   Medication List       This list is accurate as of: 07/25/13 11:59 PM.  Always use your most recent med list.               ALEVE 220 MG tablet  Generic drug:  naproxen sodium  Take 220 mg by mouth 2 (two) times daily as needed.     omeprazole 20 MG capsule  Commonly known as:  PRILOSEC  Take 1 capsule (20 mg total) by mouth daily.

## 2013-07-25 NOTE — Patient Instructions (Signed)
[facility] Low Glycemic Diet (Source: [facility], 2006)  Low Glycemic Foods (20-49) (Decrease risk of developing heart disease)  Best for Diabetes: Eat Mostly these  Breakfast Cereals: All-Bran All-Bran Fruit 'n Oats Fiber One Oatmeal (not instant) Oat bran  Fruits and fruit juices: (Limit to 1-2 servings per day) Apples Apricots (fresh & dried) Blackberries Blueberries Cherries Cranberries Peaches Pears Plums Prunes Grapefruit Raspberries Strawberries Tangerine  Juices: Apple juice Grapefruit juice Tomato juice  Beans and legumes (fresh-cooked): Black-eyed peas Butter beans Chick peas Lentils  Corpuz beans Lima beans Kidney beans Navy beans Pinto beans Snow peas  Non-starchy vegetables: Asparagus, avocado, broccoli, cabbage, cauliflower, celery, cucumber, greens, lettuce, mushrooms, peppers, tomatoes, okra, onions, spinach, summer squash  Grains: Barley Bulgur Rye Wild rice  Nuts and oils : Almonds Peanuts Sunflower seeds Hazelnuts Pecans Walnuts Oils that are liquid at room temperature  Dairy, fish, meat, soy, and eggs: Milk, skim Lowfat cheese Yogurt, lowfat, fruit sugar sweetened Lean red meat Fish  Skinless chicken & turkey Shellfish Egg whites (up to 3 daily) Soy products  Egg yolks (up to 7 or _____ per week) Moderate Glycemic Foods (50-69)  OK sometimes with diabetes  Breakfast Cereals: Bran Buds Bran Chex Just Right Mini-Wheats  Special K Swiss muesli  Fruits: Banana (under-ripe) Dates Figs Grapes Kiwi Mango Oranges Raisins  Fruit Juices: Cranberry juice Orange juice  Beans and legumes: Boston-type baked beans Canned pinto, kidney, or navy beans Nyce peas  Vegetables: Beets Carrots  Sweet potato Yam Corn on the cob  Breads: Pita (pocket) bread Oat bran bread Pumpernickel bread Rye bread Wheat bread, high fiber   Grains: Cornmeal Rice, brown Rice, white Couscous  Pasta: Macaroni Pizza, cheese  Ravioli, meat filled Spaghetti, white   Nuts: Cashews Macadamia  Snacks: Chocolate Ice cream, lowfat Muffin Popcorn High Glycemic Foods (70-100)  Rare: Eat occaisionally with diabetes  THESE ARE THE WORST KIND OF FOODS FOR YOUR DIABETES  Breakfast Cereals: Cheerios Corn Chex Corn Flakes Cream of Wheat Grape Nuts Grape Nut Flakes Grits Nutri-Grain Puffed Rice Puffed Wheat Rice Chex Rice Krispies Shredded Wheat Team Total  Fruits: Pineapple Watermelon Banana (over-ripe) Beverages: Sodas, sweet tea, pineapple juice  Vegetables: Potato, baked, boiled, fried, mashed French fries Canned or frozen corn Parsnips Winter squash  Breads: Most breads (white and whole grain) Bagels Bread sticks Bread stuffing Kaiser roll Dinner rolls  Grains: Rice, instant Tapioca, with milk Candy and most cookies  Snacks: Donuts Corn chips Jelly beans Pretzels Pastries     

## 2013-07-31 ENCOUNTER — Other Ambulatory Visit: Payer: Self-pay | Admitting: *Deleted

## 2013-07-31 DIAGNOSIS — I83893 Varicose veins of bilateral lower extremities with other complications: Secondary | ICD-10-CM

## 2013-08-22 DIAGNOSIS — M5137 Other intervertebral disc degeneration, lumbosacral region: Secondary | ICD-10-CM | POA: Diagnosis not present

## 2013-08-22 DIAGNOSIS — M5430 Sciatica, unspecified side: Secondary | ICD-10-CM | POA: Diagnosis not present

## 2013-08-22 DIAGNOSIS — M999 Biomechanical lesion, unspecified: Secondary | ICD-10-CM | POA: Diagnosis not present

## 2013-08-27 DIAGNOSIS — M5137 Other intervertebral disc degeneration, lumbosacral region: Secondary | ICD-10-CM | POA: Diagnosis not present

## 2013-08-27 DIAGNOSIS — M5430 Sciatica, unspecified side: Secondary | ICD-10-CM | POA: Diagnosis not present

## 2013-08-27 DIAGNOSIS — M999 Biomechanical lesion, unspecified: Secondary | ICD-10-CM | POA: Diagnosis not present

## 2013-08-29 DIAGNOSIS — M999 Biomechanical lesion, unspecified: Secondary | ICD-10-CM | POA: Diagnosis not present

## 2013-08-29 DIAGNOSIS — M5430 Sciatica, unspecified side: Secondary | ICD-10-CM | POA: Diagnosis not present

## 2013-08-29 DIAGNOSIS — M5137 Other intervertebral disc degeneration, lumbosacral region: Secondary | ICD-10-CM | POA: Diagnosis not present

## 2013-08-31 DIAGNOSIS — M999 Biomechanical lesion, unspecified: Secondary | ICD-10-CM | POA: Diagnosis not present

## 2013-08-31 DIAGNOSIS — M5137 Other intervertebral disc degeneration, lumbosacral region: Secondary | ICD-10-CM | POA: Diagnosis not present

## 2013-08-31 DIAGNOSIS — M5430 Sciatica, unspecified side: Secondary | ICD-10-CM | POA: Diagnosis not present

## 2013-09-02 DIAGNOSIS — M5137 Other intervertebral disc degeneration, lumbosacral region: Secondary | ICD-10-CM | POA: Diagnosis not present

## 2013-09-02 DIAGNOSIS — M999 Biomechanical lesion, unspecified: Secondary | ICD-10-CM | POA: Diagnosis not present

## 2013-09-02 DIAGNOSIS — M5430 Sciatica, unspecified side: Secondary | ICD-10-CM | POA: Diagnosis not present

## 2013-09-04 DIAGNOSIS — M999 Biomechanical lesion, unspecified: Secondary | ICD-10-CM | POA: Diagnosis not present

## 2013-09-04 DIAGNOSIS — M5137 Other intervertebral disc degeneration, lumbosacral region: Secondary | ICD-10-CM | POA: Diagnosis not present

## 2013-09-04 DIAGNOSIS — M5430 Sciatica, unspecified side: Secondary | ICD-10-CM | POA: Diagnosis not present

## 2013-09-05 ENCOUNTER — Encounter: Payer: Medicare Other | Admitting: Vascular Surgery

## 2013-09-05 ENCOUNTER — Encounter (HOSPITAL_COMMUNITY): Payer: Medicare Other

## 2013-09-11 DIAGNOSIS — M999 Biomechanical lesion, unspecified: Secondary | ICD-10-CM | POA: Diagnosis not present

## 2013-09-11 DIAGNOSIS — M5137 Other intervertebral disc degeneration, lumbosacral region: Secondary | ICD-10-CM | POA: Diagnosis not present

## 2013-09-11 DIAGNOSIS — M5430 Sciatica, unspecified side: Secondary | ICD-10-CM | POA: Diagnosis not present

## 2013-09-18 ENCOUNTER — Encounter: Payer: Self-pay | Admitting: Vascular Surgery

## 2013-09-19 ENCOUNTER — Ambulatory Visit (INDEPENDENT_AMBULATORY_CARE_PROVIDER_SITE_OTHER): Payer: Medicare Other | Admitting: Vascular Surgery

## 2013-09-19 ENCOUNTER — Encounter: Payer: Self-pay | Admitting: Vascular Surgery

## 2013-09-19 ENCOUNTER — Ambulatory Visit (HOSPITAL_COMMUNITY)
Admission: RE | Admit: 2013-09-19 | Discharge: 2013-09-19 | Disposition: A | Discharge status: Home / Self Care | Payer: Medicare Other | Source: Ambulatory Visit | Attending: Vascular Surgery | Admitting: Vascular Surgery

## 2013-09-19 VITALS — BP 134/74 | HR 68 | Ht 69.0 in | Wt 207.0 lb

## 2013-09-19 DIAGNOSIS — I83893 Varicose veins of bilateral lower extremities with other complications: Secondary | ICD-10-CM

## 2013-09-19 DIAGNOSIS — I714 Abdominal aortic aneurysm, without rupture, unspecified: Secondary | ICD-10-CM | POA: Insufficient documentation

## 2013-09-19 NOTE — Progress Notes (Signed)
Referred by:  Owens Loffler, MD [place] [address] [place], [place] [address]  Reason for referral: Painful R leg varicosities  History of Present Illness  Xavier Perry is a 68 y.o. (April 30, 1945) male who presents with chief complaint: Painful R leg varicosities.  Patient notes, onset of varicose vein > 8 years ago, associated with no obvious trigger.  The patient's symptoms include: R leg aching and mild swelling.  The patient has had no history of DVT, known history of varicose vein, no history of venous stasis ulcers, no history of  Lymphedema and no history of skin changes in lower legs.  There is no family history of venous disorders.  The patient has never used compression stockings in the past.  Past Medical History  Diagnosis Date  . GERD (gastroesophageal reflux disease) 09/19/2011  . History of repair of rotator cuff 09/19/2011    L 2009 and 2010  . Squamous cell carcinoma in situ of skin 09/19/2011  . Personal history of colonic adenomas 08/30/2012  . Cancer     basal cell    Past Surgical History  Procedure Laterality Date  . Rotator cuff repair Left 2009 and 2010  . Tonsilectomy, adenoidectomy, bilateral myringotomy and tubes  1967  . Colonoscopy      History   Social History  . Marital Status: Married    Spouse Name: N/A    Number of Children: N/A  . Years of Education: N/A   Occupational History  . Not on file.   Social History Main Topics  . Smoking status: Current Every Day Smoker -- 0.50 packs/day    Types: Cigarettes  . Smokeless tobacco: Never Used  . Alcohol Use: 3.3 oz/week    3 Glasses of wine, 3 Drinks containing 0.5 oz of alcohol per week     Comment: occasional   . Drug Use: No  . Sexual Activity: Not on file   Other Topics Concern  . Not on file   Social History Narrative  . No narrative on file    Family History  Problem Relation Age of Onset  . Colon cancer Neg Hx   . Esophageal cancer Neg Hx   . Rectal  cancer Neg Hx   . Stomach cancer Neg Hx   . Varicose Veins Mother   . Cancer Father     Current Outpatient Prescriptions on File Prior to Visit  Medication Sig Dispense Refill  . naproxen sodium (ALEVE) 220 MG tablet Take 220 mg by mouth 2 (two) times daily as needed.      Marland Kitchen omeprazole (PRILOSEC) 20 MG capsule Take 1 capsule (20 mg total) by mouth daily.  90 capsule  3   No current facility-administered medications on file prior to visit.    No Known Allergies  REVIEW OF SYSTEMS:  (Positives checked otherwise negative)  CARDIOVASCULAR:  []  chest pain, []  chest pressure, []  palpitations, []  shortness of breath when laying flat, []  shortness of breath with exertion,  [x]  pain in feet when walking, []  pain in feet when laying flat, []  history of blood clot in veins (DVT), []  history of phlebitis, []  swelling in legs, [x]  varicose veins  PULMONARY:  []  productive cough, []  asthma, []  wheezing  NEUROLOGIC:  []  weakness in arms or legs, []  numbness in arms or legs, []  difficulty speaking or slurred speech, []  temporary loss of vision in one eye, []  dizziness  HEMATOLOGIC:  []  bleeding problems, []  problems with blood clotting  too easily  MUSCULOSKEL:  []  joint pain, []  joint swelling  GASTROINTEST:  []  vomiting blood, []  blood in stool     GENITOURINARY:  []  burning with urination, []  blood in urine  PSYCHIATRIC:  []  history of major depression  INTEGUMENTARY:  []  rashes, []  ulcers  CONSTITUTIONAL:  []  fever, []  chills   Physical Examination Filed Vitals:   09/19/13 1517  BP: 134/74  Pulse: 68  Height: 5\' 9"  (1.753 m)  Weight: 207 lb (93.895 kg)  SpO2: 98%   Body mass index is 30.55 kg/(m^2).  General: A&O x 3, WDWN  Head: [place]/AT  Ear/Nose/Throat: Hearing grossly intact, nares w/o erythema or drainage, oropharynx w/o Erythema/Exudate  Eyes: PERRLA, EOMI  Neck: Supple, no nuchal rigidity, no palpable LAD  Pulmonary: Sym exp, good air movt, CTAB, no rales, rhonchi,  & wheezing  Cardiac: RRR, Nl S1, S2, no Murmurs, rubs or gallops  Vascular: Vessel Right Left  Radial Palpable Palpable  Brachial Palpable Palpable  Carotid Palpable, without bruit Palpable, without bruit  Aorta Not palpable N/A  Femoral Palpable Palpable  Popliteal Not palpable Not palpable  PT Palpable Palpable  DP Palpable Palpable   Gastrointestinal: soft, NTND, -G/R, - HSM, - masses, - CVAT B  Musculoskeletal: M/S 5/5 throughout , Extremities without ischemic changes   Neurologic: CN 2-12 intact , Pain and light touch intact in extremities , Motor exam as listed above  Psychiatric: Judgment intact, Mood & affect appropriate for pt's clinical situation  Dermatologic: See M/S exam for extremity exam, no rashes otherwise noted  Lymph : No Cervical, Axillary, or Inguinal lymphadenopathy   Non-Invasive Vascular Imaging   RLE Venous Insufficiency Duplex (Date: 09/19/2013):   noD VT and SVT,   + GSV reflux,   + deep venous reflux: CFV  Outside Studies/Documentation 5 pages of outside documents were reviewed including: outpatient clinic.  Medical Decision Making  Xavier Perry is a 68 y.o. male who presents with: RLE chronic venous insufficiency (C2), sx varicosities   Based on the patient's history and examination, I recommend: compressive therapy  I discussed with the patient the use of her 20-30 mm thigh high compression stockings and need for 3 month trial of such.  The patient will follow up in 3 months with my partners in the [facility] for evaluation for: EVLA R GSV.  Thank you for allowing [place] to participate in this patient's care.  Adele Barthel, MD [facility] Office: [phone] Pager: [phone]  09/19/2013, 3:39 PM

## 2013-09-26 DIAGNOSIS — M999 Biomechanical lesion, unspecified: Secondary | ICD-10-CM | POA: Diagnosis not present

## 2013-09-26 DIAGNOSIS — M5430 Sciatica, unspecified side: Secondary | ICD-10-CM | POA: Diagnosis not present

## 2013-09-26 DIAGNOSIS — M5137 Other intervertebral disc degeneration, lumbosacral region: Secondary | ICD-10-CM | POA: Diagnosis not present

## 2013-11-25 DIAGNOSIS — M9903 Segmental and somatic dysfunction of lumbar region: Secondary | ICD-10-CM | POA: Diagnosis not present

## 2013-11-25 DIAGNOSIS — M5431 Sciatica, right side: Secondary | ICD-10-CM | POA: Diagnosis not present

## 2013-11-25 DIAGNOSIS — M62830 Muscle spasm of back: Secondary | ICD-10-CM | POA: Diagnosis not present

## 2013-11-25 DIAGNOSIS — M955 Acquired deformity of pelvis: Secondary | ICD-10-CM | POA: Diagnosis not present

## 2013-11-25 DIAGNOSIS — M9904 Segmental and somatic dysfunction of sacral region: Secondary | ICD-10-CM | POA: Diagnosis not present

## 2013-11-25 DIAGNOSIS — M9905 Segmental and somatic dysfunction of pelvic region: Secondary | ICD-10-CM | POA: Diagnosis not present

## 2013-11-26 DIAGNOSIS — M5431 Sciatica, right side: Secondary | ICD-10-CM | POA: Diagnosis not present

## 2013-11-26 DIAGNOSIS — M62830 Muscle spasm of back: Secondary | ICD-10-CM | POA: Diagnosis not present

## 2013-11-26 DIAGNOSIS — M955 Acquired deformity of pelvis: Secondary | ICD-10-CM | POA: Diagnosis not present

## 2013-11-26 DIAGNOSIS — M9905 Segmental and somatic dysfunction of pelvic region: Secondary | ICD-10-CM | POA: Diagnosis not present

## 2013-11-26 DIAGNOSIS — M9903 Segmental and somatic dysfunction of lumbar region: Secondary | ICD-10-CM | POA: Diagnosis not present

## 2013-11-26 DIAGNOSIS — M9904 Segmental and somatic dysfunction of sacral region: Secondary | ICD-10-CM | POA: Diagnosis not present

## 2013-11-27 DIAGNOSIS — M9904 Segmental and somatic dysfunction of sacral region: Secondary | ICD-10-CM | POA: Diagnosis not present

## 2013-11-27 DIAGNOSIS — M5431 Sciatica, right side: Secondary | ICD-10-CM | POA: Diagnosis not present

## 2013-11-27 DIAGNOSIS — M955 Acquired deformity of pelvis: Secondary | ICD-10-CM | POA: Diagnosis not present

## 2013-11-27 DIAGNOSIS — M9905 Segmental and somatic dysfunction of pelvic region: Secondary | ICD-10-CM | POA: Diagnosis not present

## 2013-11-27 DIAGNOSIS — M9903 Segmental and somatic dysfunction of lumbar region: Secondary | ICD-10-CM | POA: Diagnosis not present

## 2013-11-27 DIAGNOSIS — M62830 Muscle spasm of back: Secondary | ICD-10-CM | POA: Diagnosis not present

## 2013-12-02 DIAGNOSIS — M5431 Sciatica, right side: Secondary | ICD-10-CM | POA: Diagnosis not present

## 2013-12-02 DIAGNOSIS — M955 Acquired deformity of pelvis: Secondary | ICD-10-CM | POA: Diagnosis not present

## 2013-12-02 DIAGNOSIS — M62830 Muscle spasm of back: Secondary | ICD-10-CM | POA: Diagnosis not present

## 2013-12-02 DIAGNOSIS — M9903 Segmental and somatic dysfunction of lumbar region: Secondary | ICD-10-CM | POA: Diagnosis not present

## 2013-12-02 DIAGNOSIS — M9905 Segmental and somatic dysfunction of pelvic region: Secondary | ICD-10-CM | POA: Diagnosis not present

## 2013-12-02 DIAGNOSIS — M9904 Segmental and somatic dysfunction of sacral region: Secondary | ICD-10-CM | POA: Diagnosis not present

## 2013-12-04 DIAGNOSIS — M9904 Segmental and somatic dysfunction of sacral region: Secondary | ICD-10-CM | POA: Diagnosis not present

## 2013-12-04 DIAGNOSIS — M955 Acquired deformity of pelvis: Secondary | ICD-10-CM | POA: Diagnosis not present

## 2013-12-04 DIAGNOSIS — M5431 Sciatica, right side: Secondary | ICD-10-CM | POA: Diagnosis not present

## 2013-12-04 DIAGNOSIS — M9905 Segmental and somatic dysfunction of pelvic region: Secondary | ICD-10-CM | POA: Diagnosis not present

## 2013-12-04 DIAGNOSIS — M62830 Muscle spasm of back: Secondary | ICD-10-CM | POA: Diagnosis not present

## 2013-12-04 DIAGNOSIS — M9903 Segmental and somatic dysfunction of lumbar region: Secondary | ICD-10-CM | POA: Diagnosis not present

## 2013-12-06 DIAGNOSIS — M9904 Segmental and somatic dysfunction of sacral region: Secondary | ICD-10-CM | POA: Diagnosis not present

## 2013-12-06 DIAGNOSIS — M9903 Segmental and somatic dysfunction of lumbar region: Secondary | ICD-10-CM | POA: Diagnosis not present

## 2013-12-06 DIAGNOSIS — M9905 Segmental and somatic dysfunction of pelvic region: Secondary | ICD-10-CM | POA: Diagnosis not present

## 2013-12-06 DIAGNOSIS — M955 Acquired deformity of pelvis: Secondary | ICD-10-CM | POA: Diagnosis not present

## 2013-12-06 DIAGNOSIS — M5431 Sciatica, right side: Secondary | ICD-10-CM | POA: Diagnosis not present

## 2013-12-06 DIAGNOSIS — M62830 Muscle spasm of back: Secondary | ICD-10-CM | POA: Diagnosis not present

## 2013-12-09 DIAGNOSIS — M9905 Segmental and somatic dysfunction of pelvic region: Secondary | ICD-10-CM | POA: Diagnosis not present

## 2013-12-09 DIAGNOSIS — M9903 Segmental and somatic dysfunction of lumbar region: Secondary | ICD-10-CM | POA: Diagnosis not present

## 2013-12-09 DIAGNOSIS — M62830 Muscle spasm of back: Secondary | ICD-10-CM | POA: Diagnosis not present

## 2013-12-09 DIAGNOSIS — M955 Acquired deformity of pelvis: Secondary | ICD-10-CM | POA: Diagnosis not present

## 2013-12-16 ENCOUNTER — Encounter: Payer: Self-pay | Admitting: Vascular Surgery

## 2013-12-16 DIAGNOSIS — M955 Acquired deformity of pelvis: Secondary | ICD-10-CM | POA: Diagnosis not present

## 2013-12-16 DIAGNOSIS — M62830 Muscle spasm of back: Secondary | ICD-10-CM | POA: Diagnosis not present

## 2013-12-16 DIAGNOSIS — M9903 Segmental and somatic dysfunction of lumbar region: Secondary | ICD-10-CM | POA: Diagnosis not present

## 2013-12-16 DIAGNOSIS — M9905 Segmental and somatic dysfunction of pelvic region: Secondary | ICD-10-CM | POA: Diagnosis not present

## 2013-12-17 ENCOUNTER — Encounter: Payer: Self-pay | Admitting: Vascular Surgery

## 2013-12-17 ENCOUNTER — Other Ambulatory Visit: Payer: Self-pay | Admitting: *Deleted

## 2013-12-17 ENCOUNTER — Ambulatory Visit (INDEPENDENT_AMBULATORY_CARE_PROVIDER_SITE_OTHER): Payer: Medicare Other | Admitting: Vascular Surgery

## 2013-12-17 VITALS — BP 145/83 | HR 71 | Ht 69.0 in | Wt 215.8 lb

## 2013-12-17 DIAGNOSIS — I83891 Varicose veins of right lower extremities with other complications: Secondary | ICD-10-CM

## 2013-12-17 NOTE — Progress Notes (Signed)
Subjective:     Patient ID: Xavier Perry, male   DOB: 1945-05-24, 68 y.o.   MRN: [id number]  HPI this 68 year old male returns for continued follow-up regarding his painful varicosities in the right leg. These have been present for 8-9 years and are causing aching throbbing and burning discomfort in the thigh and calf. He has tried long-leg elastic compression stockings elevation and ibuprofen with no success. The symptoms are affecting his daily living. He has no history of DVT.  Past Medical History  Diagnosis Date  . GERD (gastroesophageal reflux disease) 09/19/2011  . History of repair of rotator cuff 09/19/2011    L 2009 and 2010  . Squamous cell carcinoma in situ of skin 09/19/2011  . Personal history of colonic adenomas 08/30/2012  . Cancer     basal cell    History  Substance Use Topics  . Smoking status: Current Every Day Smoker -- 0.50 packs/day    Types: Cigarettes  . Smokeless tobacco: Never Used  . Alcohol Use: 3.6 oz/week    3 Glasses of wine, 3 Not specified per week     Comment: occasional     Family History  Problem Relation Age of Onset  . Colon cancer Neg Hx   . Esophageal cancer Neg Hx   . Rectal cancer Neg Hx   . Stomach cancer Neg Hx   . Varicose Veins Mother   . Cancer Father     No Known Allergies  Current outpatient prescriptions: naproxen sodium (ALEVE) 220 MG tablet, Take 220 mg by mouth 2 (two) times daily as needed., Disp: , Rfl: ;  omeprazole (PRILOSEC) 20 MG capsule, Take 1 capsule (20 mg total) by mouth daily., Disp: 90 capsule, Rfl: 3  BP 145/83 mmHg  Pulse 71  Ht 5\' 9"  (1.753 m)  Wt 215 lb 12.8 oz (97.886 kg)  BMI 31.85 kg/m2  SpO2 95%  Body mass index is 31.85 kg/(m^2).           Review of Systems denies chest pain, dyspnea on exertion, PND, orthopnea. Does have leg pain with walking.     Objective:   Physical Exam BP 145/83 mmHg  Pulse 71  Ht 5\' 9"  (1.753 m)  Wt 215 lb 12.8 oz (97.886 kg)  BMI 31.85 kg/m2  SpO2 95%  Gen.  well-developed well-nourished male no apparent stress alert and oriented 3 Lungs no rhonchi or wheezing Right leg with large bulging varicosities in the medial distal thigh extending into the medial calf anteriorly and posteriorly with 1+ edema distally but no ulceration or hyperpigmentation noted.  Formal duplex exam at last visit revealed gross reflux throughout right great saphenous system from mid calf to near saphenofemoral junction with no DVT     Assessment:     Gross reflux right great saphenous vein supplying large painful varicosities right leg which are affecting patient's daily living and resistant to conservative measures    Plan:     Patient needs #1 laser ablation right great saphenous vein. He should then return in 3 months to evaluate for possible stab phlebectomy of residual painful varicosities. We will proceed with precertification to perform this in the near future

## 2013-12-30 ENCOUNTER — Other Ambulatory Visit: Payer: Medicare Other | Admitting: Vascular Surgery

## 2013-12-30 DIAGNOSIS — M9903 Segmental and somatic dysfunction of lumbar region: Secondary | ICD-10-CM | POA: Diagnosis not present

## 2013-12-30 DIAGNOSIS — M62830 Muscle spasm of back: Secondary | ICD-10-CM | POA: Diagnosis not present

## 2013-12-30 DIAGNOSIS — M955 Acquired deformity of pelvis: Secondary | ICD-10-CM | POA: Diagnosis not present

## 2013-12-30 DIAGNOSIS — M9905 Segmental and somatic dysfunction of pelvic region: Secondary | ICD-10-CM | POA: Diagnosis not present

## 2013-12-31 ENCOUNTER — Encounter: Payer: Self-pay | Admitting: Vascular Surgery

## 2014-01-06 ENCOUNTER — Encounter: Payer: Self-pay | Admitting: Vascular Surgery

## 2014-01-06 ENCOUNTER — Ambulatory Visit (INDEPENDENT_AMBULATORY_CARE_PROVIDER_SITE_OTHER): Payer: Medicare Other | Admitting: Vascular Surgery

## 2014-01-06 VITALS — BP 147/89 | HR 80 | Resp 16 | Ht 70.0 in | Wt 208.0 lb

## 2014-01-06 DIAGNOSIS — I83891 Varicose veins of right lower extremities with other complications: Secondary | ICD-10-CM

## 2014-01-06 DIAGNOSIS — I83899 Varicose veins of unspecified lower extremities with other complications: Secondary | ICD-10-CM | POA: Insufficient documentation

## 2014-01-06 NOTE — Progress Notes (Signed)
Subjective:     Patient ID: Xavier Perry, male   DOB: 08-18-1945, 69 y.o.   MRN: [id number]  HPI this 69 year old male had laser ablation of the right great saphenous vein from the proximal calf to near the saphenofemoral junction performed under local tumescent anesthesia for gross reflux with painful varicosities. He tolerated the procedure well. A total of 2100 J of energy was utilized. Review of Systems     Objective:   Physical Exam BP 147/89 mmHg  Pulse 80  Resp 16  Ht 5\' 10"  (1.778 m)  Wt 208 lb (94.348 kg)  BMI 29.84 kg/m2       Assessment:     Well-tolerated laser ablation right great saphenous vein performed under local tumescent anesthesia    Plan:     Return in one week for venous duplex exam to confirm closure right great saphenous vein Patient will then return in 3 months to evaluate for possible stab phlebectomy of painful varicosities

## 2014-01-06 NOTE — Progress Notes (Signed)
     Laser Ablation Procedure    Date: 01/06/2014   Xavier Perry DOB:23-Sep-1945  Consent signed: Yes    Surgeon:  Dr. Sherren Mocha Early  Procedure: Laser Ablation: right Greater Saphenous Vein  BP 147/89 mmHg  Pulse 80  Resp 16  Ht 5\' 10"  (1.778 m)  Wt 208 lb (94.348 kg)  BMI 29.84 kg/m2   Start: 1:05  Stop: 1:45  Tumescent Anesthesia: 450 cc 0.9% NaCl with 50 cc Lidocaine HCL with 1% Epi and 15 cc 8.4% NaHCO3  Local Anesthesia: 6 cc Lidocaine HCL and NaHCO3 (ratio 2:1)  Pulsed mode: 15 watts, 546ms delay  1.0 durqation Total energy: 2110, total pulses: 141, total time: 2:21      Patient tolerated procedure well  Notes:   Description of Procedure:  After marking the course of the secondary varicosities, the patient was placed on the operating table in the supine position, and the right leg was prepped and draped in sterile fashion.   Local anesthetic was administered and under ultrasound guidance the saphenous vein was accessed with a micro needle and guide wire; then the mirco puncture sheath was placed.  A guide wire was inserted saphenofemoral junction , followed by a 5 french sheath.  The position of the sheath and then the laser fiber below the junction was confirmed using the ultrasound.  Tumescent anesthesia was administered along the course of the saphenous vein using ultrasound guidance. The patient was placed in Trendelenburg position and protective laser glasses were placed on patient and staff, and the laser was fired at 15 watts continuous mode advancing 1-30mm/second for a total of 2110 joules.     Steri strips were applied to the stab wounds and ABD pads and thigh high compression stockings were applied.  Ace wrap bandages were applied over the phlebectomy sites and at the top of the saphenofemoral junction. Blood loss was less than 15 cc.  The patient ambulated out of the operating room having tolerated the procedure well.

## 2014-01-07 ENCOUNTER — Telehealth: Payer: Self-pay | Admitting: *Deleted

## 2014-01-07 NOTE — Telephone Encounter (Signed)
Pt doing well. Following all instructions. No pian. Will see him next Monday for his fu appt.

## 2014-01-08 ENCOUNTER — Encounter: Payer: Self-pay | Admitting: Vascular Surgery

## 2014-01-10 ENCOUNTER — Encounter: Payer: Self-pay | Admitting: Vascular Surgery

## 2014-01-13 ENCOUNTER — Ambulatory Visit (INDEPENDENT_AMBULATORY_CARE_PROVIDER_SITE_OTHER): Payer: Medicare Other | Admitting: Vascular Surgery

## 2014-01-13 ENCOUNTER — Encounter: Payer: Self-pay | Admitting: Vascular Surgery

## 2014-01-13 ENCOUNTER — Ambulatory Visit (HOSPITAL_COMMUNITY)
Admission: RE | Admit: 2014-01-13 | Discharge: 2014-01-13 | Disposition: A | Discharge status: Home / Self Care | Payer: Medicare Other | Source: Ambulatory Visit | Attending: Vascular Surgery | Admitting: Vascular Surgery

## 2014-01-13 VITALS — BP 139/89 | HR 67 | Resp 16 | Ht 70.0 in | Wt 205.0 lb

## 2014-01-13 DIAGNOSIS — I83891 Varicose veins of right lower extremities with other complications: Secondary | ICD-10-CM

## 2014-01-13 NOTE — Progress Notes (Signed)
Subjective:     Patient ID: Xavier Perry, male   DOB: December 09, 1945, 69 y.o.   MRN: [id number]  HPI this 69 year old male returns 1 week post laser ablation right great saphenous vein for gross reflux with painful varicosities. He has had some mild to moderate discomfort along the course of the great saphenous vein. He has worn his long-leg elastic compression stockings and taken ibuprofen as instructed. He has had no change in distal edema. He denies any chest pain, dyspnea on exertion, PND, orthopnea, hemoptysis, or claudication.  Past Medical History  Diagnosis Date  . GERD (gastroesophageal reflux disease) 09/19/2011  . History of repair of rotator cuff 09/19/2011    L 2009 and 2010  . Squamous cell carcinoma in situ of skin 09/19/2011  . Personal history of colonic adenomas 08/30/2012  . Cancer     basal cell    History  Substance Use Topics  . Smoking status: Current Every Day Smoker -- 0.50 packs/day    Types: Cigarettes  . Smokeless tobacco: Never Used  . Alcohol Use: 3.6 oz/week    3 Glasses of wine, 3 Not specified per week     Comment: occasional     Family History  Problem Relation Age of Onset  . Colon cancer Neg Hx   . Esophageal cancer Neg Hx   . Rectal cancer Neg Hx   . Stomach cancer Neg Hx   . Varicose Veins Mother   . Cancer Father     No Known Allergies  Current outpatient prescriptions: naproxen sodium (ALEVE) 220 MG tablet, Take 220 mg by mouth 2 (two) times daily as needed., Disp: , Rfl: ;  omeprazole (PRILOSEC) 20 MG capsule, Take 1 capsule (20 mg total) by mouth daily., Disp: 90 capsule, Rfl: 3  There were no vitals taken for this visit.  There is no weight on file to calculate BMI.           Review of Systems see history of present illness     Objective:   Physical Exam BP 139/89 mmHg  Pulse 67  Resp 16  Ht 5\' 10"  (1.778 m)  Wt 205 lb (92.987 kg)  BMI 29.41 kg/m2  Gen. well-developed well-nourished male no apparent stress alert and  oriented 3 Lungs-no rhonchi or wheezing Cardiovascular-regular rhythm no murmurs Right leg with mild tenderness along course of great saphenous vein in mid to proximal thigh. Moderate ecchymosis noted. No distal edema noted. Bulging varicosities are less tense.  Today I ordered a venous duplex exam of the right leg which I reviewed and interpreted. There is no DVT. The great saphenous vein on the right is totally closed from the distal knee to near the saphenofemoral junction.     Assessment:     Successful laser ablation right great saphenous vein for gross reflux with painful varicosities    Plan:     Return in 3 months to evaluate for need for potential stab phlebectomy of residual painful varicosities.

## 2014-01-28 DIAGNOSIS — M9903 Segmental and somatic dysfunction of lumbar region: Secondary | ICD-10-CM | POA: Diagnosis not present

## 2014-01-28 DIAGNOSIS — M62830 Muscle spasm of back: Secondary | ICD-10-CM | POA: Diagnosis not present

## 2014-01-28 DIAGNOSIS — M9905 Segmental and somatic dysfunction of pelvic region: Secondary | ICD-10-CM | POA: Diagnosis not present

## 2014-01-28 DIAGNOSIS — M955 Acquired deformity of pelvis: Secondary | ICD-10-CM | POA: Diagnosis not present

## 2014-02-25 DIAGNOSIS — M9903 Segmental and somatic dysfunction of lumbar region: Secondary | ICD-10-CM | POA: Diagnosis not present

## 2014-02-25 DIAGNOSIS — M955 Acquired deformity of pelvis: Secondary | ICD-10-CM | POA: Diagnosis not present

## 2014-02-25 DIAGNOSIS — M62830 Muscle spasm of back: Secondary | ICD-10-CM | POA: Diagnosis not present

## 2014-02-25 DIAGNOSIS — M9905 Segmental and somatic dysfunction of pelvic region: Secondary | ICD-10-CM | POA: Diagnosis not present

## 2014-03-25 DIAGNOSIS — M955 Acquired deformity of pelvis: Secondary | ICD-10-CM | POA: Diagnosis not present

## 2014-03-25 DIAGNOSIS — M62830 Muscle spasm of back: Secondary | ICD-10-CM | POA: Diagnosis not present

## 2014-03-25 DIAGNOSIS — M9905 Segmental and somatic dysfunction of pelvic region: Secondary | ICD-10-CM | POA: Diagnosis not present

## 2014-03-25 DIAGNOSIS — M9903 Segmental and somatic dysfunction of lumbar region: Secondary | ICD-10-CM | POA: Diagnosis not present

## 2014-04-04 DIAGNOSIS — J00 Acute nasopharyngitis [common cold]: Secondary | ICD-10-CM | POA: Diagnosis not present

## 2014-04-21 ENCOUNTER — Encounter: Payer: Self-pay | Admitting: Vascular Surgery

## 2014-04-22 ENCOUNTER — Encounter: Payer: Self-pay | Admitting: Vascular Surgery

## 2014-04-22 ENCOUNTER — Ambulatory Visit (INDEPENDENT_AMBULATORY_CARE_PROVIDER_SITE_OTHER): Payer: Medicare Other | Admitting: Vascular Surgery

## 2014-04-22 VITALS — BP 135/84 | HR 66 | Resp 16 | Ht 70.0 in | Wt 202.0 lb

## 2014-04-22 DIAGNOSIS — I83891 Varicose veins of right lower extremities with other complications: Secondary | ICD-10-CM | POA: Diagnosis not present

## 2014-04-22 NOTE — Progress Notes (Signed)
Subjective:     Patient ID: Xavier Perry, male   DOB: August 04, 1945, 69 y.o.   MRN: [id number]  HPI this 69 year old male had laser ablation of the right great saphenous vein for gross reflux with painful varicosities in the lower thigh and calf performed 3 months ago. He has had significant decompression of the prominent varicosities in the lower thigh but continues to notice varicosities from the knee to the ankle which are symptomatic as the day progresses. He has no distal edema. He does not wear a long-leg elastic compression stocking any longer. He has no symptoms in the contralateral left leg. He has no history of DVT or thrombophlebitis.  Past Medical History  Diagnosis Date  . GERD (gastroesophageal reflux disease) 09/19/2011  . History of repair of rotator cuff 09/19/2011    L 2009 and 2010  . Squamous cell carcinoma in situ of skin 09/19/2011  . Personal history of colonic adenomas 08/30/2012  . Cancer     basal cell    History  Substance Use Topics  . Smoking status: Current Every Day Smoker -- 0.50 packs/day    Types: Cigarettes  . Smokeless tobacco: Never Used  . Alcohol Use: 3.6 oz/week    3 Glasses of wine, 3 Standard drinks or equivalent per week     Comment: occasional     Family History  Problem Relation Age of Onset  . Colon cancer Neg Hx   . Esophageal cancer Neg Hx   . Rectal cancer Neg Hx   . Stomach cancer Neg Hx   . Varicose Veins Mother   . Cancer Father     No Known Allergies   Current outpatient prescriptions:  .  naproxen sodium (ALEVE) 220 MG tablet, Take 220 mg by mouth 2 (two) times daily as needed., Disp: , Rfl:  .  omeprazole (PRILOSEC) 20 MG capsule, Take 1 capsule (20 mg total) by mouth daily., Disp: 90 capsule, Rfl: 3  Filed Vitals:   04/22/14 1025  BP: 135/84  Pulse: 66  Resp: 16  Height: 5\' 10"  (1.778 m)  Weight: 202 lb (91.627 kg)    Body mass index is 28.98 kg/(m^2).         Review of Systems denies chest pain, dyspnea on  exertion, PND, orthopnea, hemoptysis, claudication    Objective:   Physical Exam BP 135/84 mmHg  Pulse 66  Resp 16  Ht 5\' 10"  (1.778 m)  Wt 202 lb (91.627 kg)  BMI 28.98 kg/m2  . Gen. well-developed well-nourished male no apparent stress alert and oriented 3 Lungs no rhonchi or wheezing Cardiovascular regular rhythm no murmurs Right leg with bulging varicosities over great saphenous system from knee to medial malleolus with a few varicosities laterally. Thigh varicosities no longer noted. No distal edema noted. 3+ to Salas pedis pulse palpable.     Assessment:     Bulging varicosities right calf status post laser ablation right great saphenous vein 3 months ago-remain symptomatic Resolution of bulging varicosities in right distal thigh    Plan:     Patient will decide regarding stab phlebectomy of these residual varicosities be performed in the near future if he wants to proceed

## 2014-06-06 ENCOUNTER — Encounter: Payer: Self-pay | Admitting: Vascular Surgery

## 2014-06-09 ENCOUNTER — Encounter: Payer: Self-pay | Admitting: Vascular Surgery

## 2014-06-09 ENCOUNTER — Ambulatory Visit (INDEPENDENT_AMBULATORY_CARE_PROVIDER_SITE_OTHER): Payer: Medicare Other | Admitting: Vascular Surgery

## 2014-06-09 VITALS — BP 129/79 | HR 54 | Resp 16 | Ht 70.0 in | Wt 198.0 lb

## 2014-06-09 DIAGNOSIS — I83891 Varicose veins of right lower extremities with other complications: Secondary | ICD-10-CM | POA: Diagnosis not present

## 2014-06-09 NOTE — Progress Notes (Signed)
    Stab Phlebectomy Procedure  Xavier Perry DOB:[birth year]  06/09/2014  Consent signed: Yes  Surgeon:J.D. Kellie Simmering  Procedure: stab phlebectomy: right leg  BP 129/79 mmHg  Pulse 54  Resp 16  Ht 5\' 10"  (1.778 m)  Wt 198 lb (89.812 kg)  BMI 28.41 kg/m2  Start time: 9   End time: 9:40   Tumescent Anesthesia: 80 cc 0.9% NaCl with 50 cc Lidocaine HCL with 1% Epi and 15 cc 8.4% NaHCO3  Local Anesthesia: 3 cc Lidocaine HCL and NaHCO3 (ratio 2:1)    Stab Phlebectomy: 10-20 Sites: Calf  Patient tolerated procedure well: Yes  Notes:   Description of Procedure:  After marking the course of the secondary varicosities, the patient was placed on the operating table in the supine position, and the right leg was prepped and draped in sterile fashion.    The patient was then put into Trendelenburg position.  Local anesthetic was administered at the previously marked varicosities, and tumescent anesthesia was administered around the vessels.  Ten to 20 stab wounds were made using the tip of an 11 blade. And using the vein hook, the phlebectomies were performed using a hemostat to avulse the varicosities.  Adequate hemostasis was achieved, and steri strips were applied to the stab wound.      ABD pads and thigh high compression stockings were applied as well ace wraps where needed. Blood loss was less than 15 cc.  The patient ambulated out of the operating room having tolerated the procedure well.

## 2014-06-09 NOTE — Progress Notes (Signed)
Subjective:     Patient ID: Xavier Perry, male   DOB: 02-25-1945, 69 y.o.   MRN: [id number]  HPI this 69 year old male had multiple stab phlebectomy of painful varicosities in the right leg performed under local tumescent anesthesia. He tolerated the procedure well. Review of Systems     Objective:   Physical Exam BP 129/79 mmHg  Pulse 54  Resp 16  Ht 5\' 10"  (1.778 m)  Wt 198 lb (89.812 kg)  BMI 28.41 kg/m2       Assessment:     Well tolerated multiple stab phlebectomy (10-20) of painful varicosities performed under local tumescent anesthesia    Plan:     Return in 3 months for final follow-up

## 2014-06-10 ENCOUNTER — Telehealth: Payer: Self-pay | Admitting: *Deleted

## 2014-06-10 NOTE — Telephone Encounter (Signed)
Pt doing well. No bleeding from the phlebectomy sites. Following all instructions. Will see him at his 3 mo fu appt.

## 2014-06-11 ENCOUNTER — Encounter: Payer: Self-pay | Admitting: Vascular Surgery

## 2014-09-11 DIAGNOSIS — M9903 Segmental and somatic dysfunction of lumbar region: Secondary | ICD-10-CM | POA: Diagnosis not present

## 2014-09-11 DIAGNOSIS — M62830 Muscle spasm of back: Secondary | ICD-10-CM | POA: Diagnosis not present

## 2014-09-11 DIAGNOSIS — M955 Acquired deformity of pelvis: Secondary | ICD-10-CM | POA: Diagnosis not present

## 2014-09-11 DIAGNOSIS — M9905 Segmental and somatic dysfunction of pelvic region: Secondary | ICD-10-CM | POA: Diagnosis not present

## 2014-09-13 DIAGNOSIS — M62830 Muscle spasm of back: Secondary | ICD-10-CM | POA: Diagnosis not present

## 2014-09-13 DIAGNOSIS — M9905 Segmental and somatic dysfunction of pelvic region: Secondary | ICD-10-CM | POA: Diagnosis not present

## 2014-09-13 DIAGNOSIS — M9903 Segmental and somatic dysfunction of lumbar region: Secondary | ICD-10-CM | POA: Diagnosis not present

## 2014-09-13 DIAGNOSIS — M955 Acquired deformity of pelvis: Secondary | ICD-10-CM | POA: Diagnosis not present

## 2014-09-15 DIAGNOSIS — M9905 Segmental and somatic dysfunction of pelvic region: Secondary | ICD-10-CM | POA: Diagnosis not present

## 2014-09-15 DIAGNOSIS — M955 Acquired deformity of pelvis: Secondary | ICD-10-CM | POA: Diagnosis not present

## 2014-09-15 DIAGNOSIS — M62830 Muscle spasm of back: Secondary | ICD-10-CM | POA: Diagnosis not present

## 2014-09-15 DIAGNOSIS — M9903 Segmental and somatic dysfunction of lumbar region: Secondary | ICD-10-CM | POA: Diagnosis not present

## 2014-09-16 DIAGNOSIS — M62830 Muscle spasm of back: Secondary | ICD-10-CM | POA: Diagnosis not present

## 2014-09-16 DIAGNOSIS — M955 Acquired deformity of pelvis: Secondary | ICD-10-CM | POA: Diagnosis not present

## 2014-09-16 DIAGNOSIS — M9903 Segmental and somatic dysfunction of lumbar region: Secondary | ICD-10-CM | POA: Diagnosis not present

## 2014-09-16 DIAGNOSIS — M9905 Segmental and somatic dysfunction of pelvic region: Secondary | ICD-10-CM | POA: Diagnosis not present

## 2014-09-18 ENCOUNTER — Encounter: Payer: Self-pay | Admitting: Internal Medicine

## 2014-09-18 DIAGNOSIS — M9903 Segmental and somatic dysfunction of lumbar region: Secondary | ICD-10-CM | POA: Diagnosis not present

## 2014-09-18 DIAGNOSIS — M62830 Muscle spasm of back: Secondary | ICD-10-CM | POA: Diagnosis not present

## 2014-09-18 DIAGNOSIS — M955 Acquired deformity of pelvis: Secondary | ICD-10-CM | POA: Diagnosis not present

## 2014-09-18 DIAGNOSIS — M9905 Segmental and somatic dysfunction of pelvic region: Secondary | ICD-10-CM | POA: Diagnosis not present

## 2014-09-23 ENCOUNTER — Ambulatory Visit: Payer: Medicare Other | Admitting: Vascular Surgery

## 2014-09-27 DIAGNOSIS — M9905 Segmental and somatic dysfunction of pelvic region: Secondary | ICD-10-CM | POA: Diagnosis not present

## 2014-09-27 DIAGNOSIS — M9903 Segmental and somatic dysfunction of lumbar region: Secondary | ICD-10-CM | POA: Diagnosis not present

## 2014-09-27 DIAGNOSIS — M62830 Muscle spasm of back: Secondary | ICD-10-CM | POA: Diagnosis not present

## 2014-09-27 DIAGNOSIS — M955 Acquired deformity of pelvis: Secondary | ICD-10-CM | POA: Diagnosis not present

## 2014-09-30 DIAGNOSIS — M955 Acquired deformity of pelvis: Secondary | ICD-10-CM | POA: Diagnosis not present

## 2014-09-30 DIAGNOSIS — M9905 Segmental and somatic dysfunction of pelvic region: Secondary | ICD-10-CM | POA: Diagnosis not present

## 2014-09-30 DIAGNOSIS — M9903 Segmental and somatic dysfunction of lumbar region: Secondary | ICD-10-CM | POA: Diagnosis not present

## 2014-09-30 DIAGNOSIS — M62830 Muscle spasm of back: Secondary | ICD-10-CM | POA: Diagnosis not present

## 2014-10-02 DIAGNOSIS — M955 Acquired deformity of pelvis: Secondary | ICD-10-CM | POA: Diagnosis not present

## 2014-10-02 DIAGNOSIS — M9903 Segmental and somatic dysfunction of lumbar region: Secondary | ICD-10-CM | POA: Diagnosis not present

## 2014-10-02 DIAGNOSIS — M62830 Muscle spasm of back: Secondary | ICD-10-CM | POA: Diagnosis not present

## 2014-10-02 DIAGNOSIS — M9905 Segmental and somatic dysfunction of pelvic region: Secondary | ICD-10-CM | POA: Diagnosis not present

## 2014-10-03 ENCOUNTER — Encounter: Payer: Self-pay | Admitting: Vascular Surgery

## 2014-10-07 ENCOUNTER — Ambulatory Visit (INDEPENDENT_AMBULATORY_CARE_PROVIDER_SITE_OTHER): Payer: Medicare Other | Admitting: Vascular Surgery

## 2014-10-07 ENCOUNTER — Encounter: Payer: Self-pay | Admitting: Vascular Surgery

## 2014-10-07 VITALS — BP 136/86 | HR 86 | Temp 98.0°F | Resp 16 | Ht 70.0 in | Wt 208.0 lb

## 2014-10-07 DIAGNOSIS — M955 Acquired deformity of pelvis: Secondary | ICD-10-CM | POA: Diagnosis not present

## 2014-10-07 DIAGNOSIS — I83891 Varicose veins of right lower extremities with other complications: Secondary | ICD-10-CM | POA: Diagnosis not present

## 2014-10-07 DIAGNOSIS — M62830 Muscle spasm of back: Secondary | ICD-10-CM | POA: Diagnosis not present

## 2014-10-07 DIAGNOSIS — M9905 Segmental and somatic dysfunction of pelvic region: Secondary | ICD-10-CM | POA: Diagnosis not present

## 2014-10-07 DIAGNOSIS — M9903 Segmental and somatic dysfunction of lumbar region: Secondary | ICD-10-CM | POA: Diagnosis not present

## 2014-10-07 NOTE — Progress Notes (Signed)
Filed Vitals:   10/07/14 0918 10/07/14 0922  BP: 155/86 136/86  Pulse: 62 86  Temp: 98 F (36.7 C)   TempSrc: Oral   Resp: 16   Height: 5\' 10"  (1.778 m)   Weight: 208 lb (94.348 kg)   SpO2: 97%

## 2014-10-07 NOTE — Progress Notes (Signed)
Subjective:     Patient ID: Xavier Perry, male   DOB: November 11, 1945, 69 y.o.   MRN: [id number]  HPI this 69 year old male returns 3 months post laser ablation right great saphenous vein followed by a three-month waiting period and then stab phlebectomy of painful varicosities in the right leg. He has had an excellent result with the exception of one recurrent painful varix in the right mid calf area. Discuss his itching burning and aching discomfort throughout the day. He has no distal edema. He is no longer having any symptoms and the remainder of the leg. He is very pleased with his result otherwise.  Past Medical History  Diagnosis Date  . GERD (gastroesophageal reflux disease) 09/19/2011  . History of repair of rotator cuff 09/19/2011    L 2009 and 2010  . Squamous cell carcinoma in situ of skin 09/19/2011  . Personal history of colonic adenomas 08/30/2012  . Cancer ([place])     basal cell    Social History  Substance Use Topics  . Smoking status: Current Every Day Smoker -- 0.50 packs/day    Types: Cigarettes  . Smokeless tobacco: Never Used  . Alcohol Use: 3.6 oz/week    3 Glasses of wine, 3 Standard drinks or equivalent per week     Comment: occasional     Family History  Problem Relation Age of Onset  . Colon cancer Neg Hx   . Esophageal cancer Neg Hx   . Rectal cancer Neg Hx   . Stomach cancer Neg Hx   . Varicose Veins Mother   . Cancer Father     No Known Allergies   Current outpatient prescriptions:  .  naproxen sodium (ALEVE) 220 MG tablet, Take 220 mg by mouth 2 (two) times daily as needed., Disp: , Rfl:  .  omeprazole (PRILOSEC) 20 MG capsule, Take 1 capsule (20 mg total) by mouth daily., Disp: 90 capsule, Rfl: 3  Filed Vitals:   10/07/14 0918 10/07/14 0922  BP: 155/86 136/86  Pulse: 62 86  Temp: 98 F (36.7 C)   TempSrc: Oral   Resp: 16   Height: 5\' 10"  (1.778 m)   Weight: 208 lb (94.348 kg)   SpO2: 97%     Body mass index is 29.84  kg/(m^2).           Review of Systems denies chest pain, dyspnea on exertion, PND, orthopnea, hemoptysis     Objective:   Physical Exam BP 136/86 mmHg  Pulse 86  Temp(Src) 98 F (36.7 C) (Oral)  Resp 16  Ht 5\' 10"  (1.778 m)  Wt 208 lb (94.348 kg)  BMI 29.84 kg/m2  SpO2 97%  Gen. well-developed well-nourished male in no apparent distress alert and oriented 3 Lungs no rhonchi or wheezing Right leg is free of any bulging varicosities or distal edema with the exception of one prominent varix in the mid medial calf over the great saphenous vein which measures about 2 cm in diameter. There is some filling of this from a reticular network. There is no ulceration.     Assessment:     Successful laser ablation right great saphenous vein with multiple stab phlebectomy with a symptomatic patient with the exception of one varix in the mid calf which is continuing to cause him symptoms.    Plan:     Patient needs one course of foam sclerotherapy to complete his treatment regimen.

## 2014-10-09 DIAGNOSIS — M9903 Segmental and somatic dysfunction of lumbar region: Secondary | ICD-10-CM | POA: Diagnosis not present

## 2014-10-09 DIAGNOSIS — M9905 Segmental and somatic dysfunction of pelvic region: Secondary | ICD-10-CM | POA: Diagnosis not present

## 2014-10-09 DIAGNOSIS — M62830 Muscle spasm of back: Secondary | ICD-10-CM | POA: Diagnosis not present

## 2014-10-09 DIAGNOSIS — M955 Acquired deformity of pelvis: Secondary | ICD-10-CM | POA: Diagnosis not present

## 2014-10-16 DIAGNOSIS — M9905 Segmental and somatic dysfunction of pelvic region: Secondary | ICD-10-CM | POA: Diagnosis not present

## 2014-10-16 DIAGNOSIS — M9903 Segmental and somatic dysfunction of lumbar region: Secondary | ICD-10-CM | POA: Diagnosis not present

## 2014-10-16 DIAGNOSIS — M955 Acquired deformity of pelvis: Secondary | ICD-10-CM | POA: Diagnosis not present

## 2014-10-16 DIAGNOSIS — M62830 Muscle spasm of back: Secondary | ICD-10-CM | POA: Diagnosis not present

## 2014-10-23 ENCOUNTER — Ambulatory Visit (AMBULATORY_SURGERY_CENTER): Payer: Self-pay | Admitting: *Deleted

## 2014-10-23 VITALS — Ht 70.0 in | Wt 208.0 lb

## 2014-10-23 DIAGNOSIS — Z86010 Personal history of colonic polyps: Secondary | ICD-10-CM

## 2014-10-23 NOTE — Progress Notes (Signed)
No egg or soy allergy No issues with past sedation- with first colon had low heart rate before the procedure so did not get a lot of sedation but never had any issues with this  No diet pills No home 02 use  emmi declined

## 2014-11-06 ENCOUNTER — Encounter: Payer: Self-pay | Admitting: Internal Medicine

## 2014-11-06 ENCOUNTER — Ambulatory Visit (AMBULATORY_SURGERY_CENTER): Payer: Medicare Other | Admitting: Internal Medicine

## 2014-11-06 VITALS — BP 146/86 | HR 61 | Temp 96.9°F | Resp 22 | Ht 70.0 in | Wt 208.0 lb

## 2014-11-06 DIAGNOSIS — Z86010 Personal history of colonic polyps: Secondary | ICD-10-CM

## 2014-11-06 DIAGNOSIS — Z1211 Encounter for screening for malignant neoplasm of colon: Secondary | ICD-10-CM | POA: Diagnosis not present

## 2014-11-06 HISTORY — PX: COLONOSCOPY WITH PROPOFOL: SHX5780

## 2014-11-06 MED ORDER — SODIUM CHLORIDE 0.9 % IV SOLN: 500.0000 mL | INTRAVENOUS | Status: DC

## 2014-11-06 NOTE — Op Note (Signed)
[facility]  [address] [place], [id number]   COLONOSCOPY PROCEDURE REPORT  PATIENT: Waddell, Iten  MR#: [id number] BIRTHDATE: [birth year] , 69  yrs. old GENDER: male ENDOSCOPIST: Gatha Mayer, MD, [facility] PROCEDURE DATE:  11/06/2014 PROCEDURE:   Colonoscopy, surveillance First Screening Colonoscopy - Avg.  risk and is 50 yrs.  old or older - No.  Prior Negative Screening - Now for repeat screening. N/A  History of Adenoma - Now for follow-up colonoscopy & has been > or = to 3 yrs.  No.  It has been less than 3 yrs since last colonoscopy.  Other: See Comments  Polyps removed today? No Recommend repeat exam, <10 yrs? Yes high risk ASA CLASS:   Class II INDICATIONS:Surveillance due to prior colonic neoplasia and PH Colon Adenoma. MEDICATIONS: Propofol 200 mg IV and Monitored anesthesia care  DESCRIPTION OF PROCEDURE:   After the risks benefits and alternatives of the procedure were thoroughly explained, informed consent was obtained.  The digital rectal exam revealed no abnormalities of the rectum.   The LB [id number]  endoscope was introduced through the anus and advanced to the cecum, which was identified by both the appendix and ileocecal valve. No adverse events experienced.   The quality of the prep was excellent. (MiraLax was used)  The instrument was then slowly withdrawn as the colon was fully examined. Estimated blood loss is zero unless otherwise noted in this procedure report.      COLON FINDINGS: There was mild diverticulosis noted in the ascending colon.   The examination was otherwise normal.   Right colon retroflexion included.  Retroflexed views revealed no abnormalities. The time to cecum = 1.3 Withdrawal time = 8.7   The scope was withdrawn and the procedure completed. COMPLICATIONS: There were no immediate complications.  ENDOSCOPIC IMPRESSION: 1.   Mild diverticulosis was noted in the ascending colon 2.   The examination was  otherwise normal - excellent prep  RECOMMENDATIONS: Repeat Colonoscopy in 3 years.  hx numeros adenomas ? attenuated polyposis  eSigned:  Gatha Mayer, MD, [facility] 11/06/2014 4:38 PM   cc: The Patient

## 2014-11-06 NOTE — Progress Notes (Signed)
A/ox3 pleased with MAC, report to Jane RN 

## 2014-11-06 NOTE — Patient Instructions (Addendum)
No polyps!  Next colonoscopy 2019-20.  I appreciate the opportunity to care for you. Gatha Mayer, MD, FACGYOU HAD AN ENDOSCOPIC [facility]EDURE [facility]:   Refer to the procedure report that was given to you for any specific questions about what was found during the examination.  If the procedure report does not answer your questions, please call your gastroenterologist to clarify.  If you requested that your care partner not be given the details of your procedure findings, then the procedure report has been included in a sealed envelope for you to review at your convenience later.  YOU SHOULD EXPECT: Some feelings of bloating in the abdomen. Passage of more gas than usual.  Walking can help get rid of the air that was put into your GI tract during the procedure and reduce the bloating. If you had a lower endoscopy (such as a colonoscopy or flexible sigmoidoscopy) you may notice spotting of blood in your stool or on the toilet paper. If you underwent a bowel prep for your procedure, you may not have a normal bowel movement for a few days.  Please Note:  You might notice some irritation and congestion in your nose or some drainage.  This is from the oxygen used during your procedure.  There is no need for concern and it should clear up in a day or so.  SYMPTOMS TO REPORT IMMEDIATELY:   Following lower endoscopy (colonoscopy or flexible sigmoidoscopy):  Excessive amounts of blood in the stool  Significant tenderness or worsening of abdominal pains  Swelling of the abdomen that is new, acute  Fever of 100F or higher   Following upper endoscopy (EGD)  Vomiting of blood or coffee ground material  New chest pain or pain under the shoulder blades  Painful or persistently difficult swallowing  New shortness of breath  Fever of 100F or higher  Black, tarry-looking stools  For urgent or emergent issues, a gastroenterologist can be reached at any hour by calling  [phone].   DIET: Your first meal following the procedure should be a small meal and then it is ok to progress to your normal diet. Heavy or fried foods are harder to digest and may make you feel nauseous or bloated.  Likewise, meals heavy in dairy and vegetables can increase bloating.  Drink plenty of fluids but you should avoid alcoholic beverages for 24 hours.  ACTIVITY:  You should plan to take it easy for the rest of today and you should NOT DRIVE or use heavy machinery until tomorrow (because of the sedation medicines used during the test).    FOLLOW UP: Our staff will call the number listed on your records the next business day following your procedure to check on you and address any questions or concerns that you may have regarding the information given to you following your procedure. If we do not reach you, we will leave a message.  However, if you are feeling well and you are not experiencing any problems, there is no need to return our call.  We will assume that you have returned to your regular daily activities without incident.  If any biopsies were taken you will be contacted by phone or by letter within the next 1-3 weeks.  Please call us at [phone] if you have not heard about the biopsies in 3 weeks.    SIGNATURES/CONFIDENTIALITY: You and/or your care partner have signed paperwork which will be entered into your electronic medical record.  These signatures attest to the fact that that the information above on your After Visit Summary has been reviewed and is understood.  Full responsibility of the confidentiality of this discharge information lies with you and/or your care-partner.  Diverticulosis information given.

## 2014-11-07 ENCOUNTER — Telehealth: Payer: Self-pay | Admitting: *Deleted

## 2014-11-07 NOTE — Telephone Encounter (Signed)
  Follow up Call-  Call back number 11/06/2014 08/30/2012  Post procedure Call Back phone  # [phone] cell [phone] cell  Permission to leave phone message Yes Yes     Patient questions:  Do you have a fever, pain , or abdominal swelling? No. Pain Score  0 *  Have you tolerated food without any problems? Yes.    Have you been able to return to your normal activities? Yes.    Do you have any questions about your discharge instructions: Diet   No. Medications  No. Follow up visit  No.  Do you have questions or concerns about your Care? No.  Actions: * If pain score is 4 or above: No action needed, pain <4.

## 2014-11-17 ENCOUNTER — Encounter: Payer: Self-pay | Admitting: *Deleted

## 2014-11-18 ENCOUNTER — Encounter: Payer: Self-pay | Admitting: *Deleted

## 2014-11-19 ENCOUNTER — Ambulatory Visit (INDEPENDENT_AMBULATORY_CARE_PROVIDER_SITE_OTHER): Payer: Medicare Other | Admitting: *Deleted

## 2014-11-19 DIAGNOSIS — I83891 Varicose veins of right lower extremities with other complications: Secondary | ICD-10-CM

## 2014-11-19 DIAGNOSIS — I83893 Varicose veins of bilateral lower extremities with other complications: Secondary | ICD-10-CM

## 2014-11-19 NOTE — Progress Notes (Signed)
X=.3% Sotradecol administered with a 27g butterfly.  Patient received a total of 3cc.  Treated the varix on his right inner calf that has been painful and a few other spiders. Easy access. Tol well. Will follow prn.  Photos: No.  Compression stockings applied: Yes.

## 2014-11-24 ENCOUNTER — Encounter: Payer: Self-pay | Admitting: Vascular Surgery

## 2015-01-02 ENCOUNTER — Ambulatory Visit (INDEPENDENT_AMBULATORY_CARE_PROVIDER_SITE_OTHER): Payer: Medicare Other | Admitting: Family Medicine

## 2015-01-02 ENCOUNTER — Encounter: Payer: Self-pay | Admitting: Family Medicine

## 2015-01-02 VITALS — BP 130/74 | HR 71 | Temp 97.8°F | Ht 70.0 in | Wt 212.5 lb

## 2015-01-02 DIAGNOSIS — M25562 Pain in left knee: Secondary | ICD-10-CM | POA: Diagnosis not present

## 2015-01-02 DIAGNOSIS — M1712 Unilateral primary osteoarthritis, left knee: Secondary | ICD-10-CM | POA: Insufficient documentation

## 2015-01-02 MED ORDER — METHYLPREDNISOLONE ACETATE 80 MG/ML IJ SUSP
80.0000 mg | Freq: Once | INTRAMUSCULAR | Status: AC
  Administered 2015-01-02: 80 mg via INTRA_ARTICULAR

## 2015-01-02 NOTE — Progress Notes (Signed)
Subjective:  Patient ID: Xavier Perry, male    DOB: [birth year]  Age: 69 y.o. MRN: [id number]  CC: Left knee pain  HPI:  69 year old male presents for an acute visit with complaints of left knee pain.  Left knee pain   Patient states that he's had a one-week history of left knee pain.  He states that it started after visiting someone in the hospital and walking up and down a ramp several days a week.  Pain is located both anteriorly and posteriorly.   Worse with activity. Patient unable to exercise and play golf as of late.  No relieving factors.  No recent fall, trauma, injury.  Social Hx   Social History   Social History  . Marital Status: Married    Spouse Name: N/A  . Number of Children: N/A  . Years of Education: N/A   Social History Main Topics  . Smoking status: Current Every Day Smoker -- 0.50 packs/day    Types: Cigarettes  . Smokeless tobacco: Never Used  . Alcohol Use: 3.6 oz/week    3 Glasses of wine, 3 Standard drinks or equivalent per week     Comment: occasional   . Drug Use: No  . Sexual Activity: Not Asked   Other Topics Concern  . None   Social History Narrative   Review of Systems  Constitutional: Negative.   Musculoskeletal:       Left knee pain.    Objective:  BP 130/74 mmHg  Pulse 71  Temp(Src) 97.8 F (36.6 C) (Oral)  Ht 5\' 10"  (1.778 m)  Wt 212 lb 8 oz (96.389 kg)  BMI 30.49 kg/m2  SpO2 96%  BP/Weight 01/02/2015 11/06/2014 [id number]  Systolic BP [id number] -  Diastolic BP 74 86 -  Wt. (Lbs) 212.5 208 208  BMI 30.49 29.84 29.84   Physical Exam  Constitutional: He appears well-developed. No distress.  Cardiovascular: Normal rate and regular rhythm.   Pulmonary/Chest: Effort normal and breath sounds normal.  Musculoskeletal:  Left knee - Knee: Mild effusion noted.  Normal ROM.  Crepitus noted with extension and flexion. Mild joint line tenderness.  No palpable baker cyst.  Neurological: He is alert.  Psychiatric: He has  a normal mood and affect.  Vitals reviewed.  Lab Results  Component Value Date   WBC 8.9 07/11/2013   HGB 15.0 07/11/2013   HCT 44.6 07/11/2013   PLT 249.0 07/11/2013   GLUCOSE 128* 07/11/2013   CHOL 211* 07/11/2013   TRIG 91.0 07/11/2013   HDL 64.60 07/11/2013   LDLDIRECT 150.0 07/10/2012   LDLCALC 128* 07/11/2013   ALT 25 07/11/2013   AST 26 07/11/2013   NA 137 07/11/2013   K 4.9 07/11/2013   CL 103 07/11/2013   CREATININE 0.9 07/11/2013   BUN 13 07/11/2013   CO2 23 07/11/2013   PSA 0.29 07/11/2013   HGBA1C 6.3 07/11/2013    Assessment & Plan:   Problem List Items Addressed This Visit    Left knee pain - Primary    Established problem with acute exacerbation. Patient with documented degenerative changes in the knee. He also reports history of meniscal injury. Xray from 2014 was reviewed. It revealed mild narrowing of the patellofemoral joint space as well as the condyle spurring.  I also reviewed PCP noted from 2014 where this problem was addressed previously (01/12/12). In summary, patient had left knee pain at that time and this was thought to be a flare of OA. He was treated successfully with  injection. He is currently experiencing a flare and requested injection as this has resulted in significant improvement in the past. Injection performed today and patient tolerated well. Advised PRN NSAID's in the near future if needed. Compression as well. Patient to follow up with PCP.      Relevant Medications   methylPREDNISolone acetate (DEPO-MEDROL) injection 80 mg (Completed)      Meds ordered this encounter  Medications  . methylPREDNISolone acetate (DEPO-MEDROL) injection 80 mg    Sig:    Procedure: Left knee injection Consent signed and scanned into record. Medication:  80 mg Depo Medrol and 4 mL of Lidocaine 1% w/o epi Preparation: area cleansed with alcohol x 3. Time Out taken  Injection  Landmarks identified Above medication injected using a standard  anteromedial approach Patient tolerated well without bleeding or paresthesias  Patient had good range of motion of joint after injection  Follow-up: PRN  [place]

## 2015-01-02 NOTE — Progress Notes (Signed)
Pre visit review using our clinic review tool, if applicable. No additional management support is needed unless otherwise documented below in the visit note. 

## 2015-01-02 NOTE — Assessment & Plan Note (Addendum)
Established problem with acute exacerbation. Patient with documented degenerative changes in the knee. He also reports history of meniscal injury. Xray from 2014 was reviewed. It revealed mild narrowing of the patellofemoral joint space as well as the condyle spurring.  I also reviewed PCP noted from 2014 where this problem was addressed previously (01/12/12). In summary, patient had left knee pain at that time and this was thought to be a flare of OA. He was treated successfully with injection. He is currently experiencing a flare and requested injection as this has resulted in significant improvement in the past. Injection performed today and patient tolerated well. Advised PRN NSAID's in the near future if needed. Compression as well. Patient to follow up with PCP.

## 2015-01-04 DIAGNOSIS — G459 Transient cerebral ischemic attack, unspecified: Secondary | ICD-10-CM

## 2015-01-04 HISTORY — DX: Transient cerebral ischemic attack, unspecified: G45.9

## 2015-01-06 DIAGNOSIS — L578 Other skin changes due to chronic exposure to nonionizing radiation: Secondary | ICD-10-CM | POA: Diagnosis not present

## 2015-01-06 DIAGNOSIS — L218 Other seborrheic dermatitis: Secondary | ICD-10-CM | POA: Diagnosis not present

## 2015-01-06 DIAGNOSIS — Z85828 Personal history of other malignant neoplasm of skin: Secondary | ICD-10-CM | POA: Diagnosis not present

## 2015-01-06 DIAGNOSIS — L570 Actinic keratosis: Secondary | ICD-10-CM | POA: Diagnosis not present

## 2015-01-06 DIAGNOSIS — Z1283 Encounter for screening for malignant neoplasm of skin: Secondary | ICD-10-CM | POA: Diagnosis not present

## 2015-01-06 DIAGNOSIS — Z08 Encounter for follow-up examination after completed treatment for malignant neoplasm: Secondary | ICD-10-CM | POA: Diagnosis not present

## 2015-01-14 DIAGNOSIS — M9903 Segmental and somatic dysfunction of lumbar region: Secondary | ICD-10-CM | POA: Diagnosis not present

## 2015-01-14 DIAGNOSIS — M955 Acquired deformity of pelvis: Secondary | ICD-10-CM | POA: Diagnosis not present

## 2015-01-14 DIAGNOSIS — M9905 Segmental and somatic dysfunction of pelvic region: Secondary | ICD-10-CM | POA: Diagnosis not present

## 2015-01-14 DIAGNOSIS — M62830 Muscle spasm of back: Secondary | ICD-10-CM | POA: Diagnosis not present

## 2015-01-15 DIAGNOSIS — M9903 Segmental and somatic dysfunction of lumbar region: Secondary | ICD-10-CM | POA: Diagnosis not present

## 2015-01-15 DIAGNOSIS — M9905 Segmental and somatic dysfunction of pelvic region: Secondary | ICD-10-CM | POA: Diagnosis not present

## 2015-01-15 DIAGNOSIS — M62830 Muscle spasm of back: Secondary | ICD-10-CM | POA: Diagnosis not present

## 2015-01-15 DIAGNOSIS — M955 Acquired deformity of pelvis: Secondary | ICD-10-CM | POA: Diagnosis not present

## 2015-01-20 DIAGNOSIS — M9903 Segmental and somatic dysfunction of lumbar region: Secondary | ICD-10-CM | POA: Diagnosis not present

## 2015-01-20 DIAGNOSIS — M62830 Muscle spasm of back: Secondary | ICD-10-CM | POA: Diagnosis not present

## 2015-01-20 DIAGNOSIS — M955 Acquired deformity of pelvis: Secondary | ICD-10-CM | POA: Diagnosis not present

## 2015-01-20 DIAGNOSIS — M9905 Segmental and somatic dysfunction of pelvic region: Secondary | ICD-10-CM | POA: Diagnosis not present

## 2015-01-21 DIAGNOSIS — M9905 Segmental and somatic dysfunction of pelvic region: Secondary | ICD-10-CM | POA: Diagnosis not present

## 2015-01-21 DIAGNOSIS — M62830 Muscle spasm of back: Secondary | ICD-10-CM | POA: Diagnosis not present

## 2015-01-21 DIAGNOSIS — M9903 Segmental and somatic dysfunction of lumbar region: Secondary | ICD-10-CM | POA: Diagnosis not present

## 2015-01-21 DIAGNOSIS — M955 Acquired deformity of pelvis: Secondary | ICD-10-CM | POA: Diagnosis not present

## 2015-01-24 DIAGNOSIS — M955 Acquired deformity of pelvis: Secondary | ICD-10-CM | POA: Diagnosis not present

## 2015-01-24 DIAGNOSIS — M9903 Segmental and somatic dysfunction of lumbar region: Secondary | ICD-10-CM | POA: Diagnosis not present

## 2015-01-24 DIAGNOSIS — M9905 Segmental and somatic dysfunction of pelvic region: Secondary | ICD-10-CM | POA: Diagnosis not present

## 2015-01-24 DIAGNOSIS — M62830 Muscle spasm of back: Secondary | ICD-10-CM | POA: Diagnosis not present

## 2015-01-26 DIAGNOSIS — M9905 Segmental and somatic dysfunction of pelvic region: Secondary | ICD-10-CM | POA: Diagnosis not present

## 2015-01-26 DIAGNOSIS — M62830 Muscle spasm of back: Secondary | ICD-10-CM | POA: Diagnosis not present

## 2015-01-26 DIAGNOSIS — M955 Acquired deformity of pelvis: Secondary | ICD-10-CM | POA: Diagnosis not present

## 2015-01-26 DIAGNOSIS — M9903 Segmental and somatic dysfunction of lumbar region: Secondary | ICD-10-CM | POA: Diagnosis not present

## 2015-01-29 DIAGNOSIS — M9903 Segmental and somatic dysfunction of lumbar region: Secondary | ICD-10-CM | POA: Diagnosis not present

## 2015-01-29 DIAGNOSIS — M955 Acquired deformity of pelvis: Secondary | ICD-10-CM | POA: Diagnosis not present

## 2015-01-29 DIAGNOSIS — M62830 Muscle spasm of back: Secondary | ICD-10-CM | POA: Diagnosis not present

## 2015-01-29 DIAGNOSIS — M9905 Segmental and somatic dysfunction of pelvic region: Secondary | ICD-10-CM | POA: Diagnosis not present

## 2015-02-03 DIAGNOSIS — M955 Acquired deformity of pelvis: Secondary | ICD-10-CM | POA: Diagnosis not present

## 2015-02-03 DIAGNOSIS — M9905 Segmental and somatic dysfunction of pelvic region: Secondary | ICD-10-CM | POA: Diagnosis not present

## 2015-02-03 DIAGNOSIS — M9903 Segmental and somatic dysfunction of lumbar region: Secondary | ICD-10-CM | POA: Diagnosis not present

## 2015-02-03 DIAGNOSIS — M62830 Muscle spasm of back: Secondary | ICD-10-CM | POA: Diagnosis not present

## 2015-02-23 DIAGNOSIS — J101 Influenza due to other identified influenza virus with other respiratory manifestations: Secondary | ICD-10-CM | POA: Diagnosis not present

## 2015-03-02 DIAGNOSIS — M62830 Muscle spasm of back: Secondary | ICD-10-CM | POA: Diagnosis not present

## 2015-03-02 DIAGNOSIS — M9903 Segmental and somatic dysfunction of lumbar region: Secondary | ICD-10-CM | POA: Diagnosis not present

## 2015-03-02 DIAGNOSIS — M9905 Segmental and somatic dysfunction of pelvic region: Secondary | ICD-10-CM | POA: Diagnosis not present

## 2015-03-02 DIAGNOSIS — M955 Acquired deformity of pelvis: Secondary | ICD-10-CM | POA: Diagnosis not present

## 2015-03-03 DIAGNOSIS — M9903 Segmental and somatic dysfunction of lumbar region: Secondary | ICD-10-CM | POA: Diagnosis not present

## 2015-03-03 DIAGNOSIS — M955 Acquired deformity of pelvis: Secondary | ICD-10-CM | POA: Diagnosis not present

## 2015-03-03 DIAGNOSIS — M62830 Muscle spasm of back: Secondary | ICD-10-CM | POA: Diagnosis not present

## 2015-03-03 DIAGNOSIS — M9905 Segmental and somatic dysfunction of pelvic region: Secondary | ICD-10-CM | POA: Diagnosis not present

## 2015-03-05 DIAGNOSIS — M9903 Segmental and somatic dysfunction of lumbar region: Secondary | ICD-10-CM | POA: Diagnosis not present

## 2015-03-05 DIAGNOSIS — M9905 Segmental and somatic dysfunction of pelvic region: Secondary | ICD-10-CM | POA: Diagnosis not present

## 2015-03-05 DIAGNOSIS — M955 Acquired deformity of pelvis: Secondary | ICD-10-CM | POA: Diagnosis not present

## 2015-03-05 DIAGNOSIS — M62830 Muscle spasm of back: Secondary | ICD-10-CM | POA: Diagnosis not present

## 2015-03-10 DIAGNOSIS — M9905 Segmental and somatic dysfunction of pelvic region: Secondary | ICD-10-CM | POA: Diagnosis not present

## 2015-03-10 DIAGNOSIS — M9903 Segmental and somatic dysfunction of lumbar region: Secondary | ICD-10-CM | POA: Diagnosis not present

## 2015-03-10 DIAGNOSIS — M62830 Muscle spasm of back: Secondary | ICD-10-CM | POA: Diagnosis not present

## 2015-03-10 DIAGNOSIS — M955 Acquired deformity of pelvis: Secondary | ICD-10-CM | POA: Diagnosis not present

## 2015-03-12 DIAGNOSIS — M1712 Unilateral primary osteoarthritis, left knee: Secondary | ICD-10-CM | POA: Diagnosis not present

## 2015-03-16 ENCOUNTER — Ambulatory Visit: Payer: Medicare Other | Admitting: Family Medicine

## 2015-03-16 DIAGNOSIS — M9903 Segmental and somatic dysfunction of lumbar region: Secondary | ICD-10-CM | POA: Diagnosis not present

## 2015-03-16 DIAGNOSIS — M62830 Muscle spasm of back: Secondary | ICD-10-CM | POA: Diagnosis not present

## 2015-03-16 DIAGNOSIS — M9905 Segmental and somatic dysfunction of pelvic region: Secondary | ICD-10-CM | POA: Diagnosis not present

## 2015-03-16 DIAGNOSIS — M955 Acquired deformity of pelvis: Secondary | ICD-10-CM | POA: Diagnosis not present

## 2015-03-17 DIAGNOSIS — M9905 Segmental and somatic dysfunction of pelvic region: Secondary | ICD-10-CM | POA: Diagnosis not present

## 2015-03-17 DIAGNOSIS — M9903 Segmental and somatic dysfunction of lumbar region: Secondary | ICD-10-CM | POA: Diagnosis not present

## 2015-03-17 DIAGNOSIS — M62830 Muscle spasm of back: Secondary | ICD-10-CM | POA: Diagnosis not present

## 2015-03-17 DIAGNOSIS — M955 Acquired deformity of pelvis: Secondary | ICD-10-CM | POA: Diagnosis not present

## 2015-03-18 DIAGNOSIS — M955 Acquired deformity of pelvis: Secondary | ICD-10-CM | POA: Diagnosis not present

## 2015-03-18 DIAGNOSIS — M9905 Segmental and somatic dysfunction of pelvic region: Secondary | ICD-10-CM | POA: Diagnosis not present

## 2015-03-18 DIAGNOSIS — M9903 Segmental and somatic dysfunction of lumbar region: Secondary | ICD-10-CM | POA: Diagnosis not present

## 2015-03-18 DIAGNOSIS — M62830 Muscle spasm of back: Secondary | ICD-10-CM | POA: Diagnosis not present

## 2015-03-20 DIAGNOSIS — M9905 Segmental and somatic dysfunction of pelvic region: Secondary | ICD-10-CM | POA: Diagnosis not present

## 2015-03-20 DIAGNOSIS — M955 Acquired deformity of pelvis: Secondary | ICD-10-CM | POA: Diagnosis not present

## 2015-03-20 DIAGNOSIS — M62830 Muscle spasm of back: Secondary | ICD-10-CM | POA: Diagnosis not present

## 2015-03-20 DIAGNOSIS — M9903 Segmental and somatic dysfunction of lumbar region: Secondary | ICD-10-CM | POA: Diagnosis not present

## 2015-03-24 ENCOUNTER — Other Ambulatory Visit (INDEPENDENT_AMBULATORY_CARE_PROVIDER_SITE_OTHER): Payer: Medicare Other

## 2015-03-24 DIAGNOSIS — E785 Hyperlipidemia, unspecified: Secondary | ICD-10-CM | POA: Diagnosis not present

## 2015-03-24 DIAGNOSIS — Z79899 Other long term (current) drug therapy: Secondary | ICD-10-CM

## 2015-03-24 DIAGNOSIS — R739 Hyperglycemia, unspecified: Secondary | ICD-10-CM | POA: Diagnosis not present

## 2015-03-24 DIAGNOSIS — M62830 Muscle spasm of back: Secondary | ICD-10-CM | POA: Diagnosis not present

## 2015-03-24 DIAGNOSIS — M9903 Segmental and somatic dysfunction of lumbar region: Secondary | ICD-10-CM | POA: Diagnosis not present

## 2015-03-24 DIAGNOSIS — Z125 Encounter for screening for malignant neoplasm of prostate: Secondary | ICD-10-CM | POA: Diagnosis not present

## 2015-03-24 DIAGNOSIS — M9905 Segmental and somatic dysfunction of pelvic region: Secondary | ICD-10-CM | POA: Diagnosis not present

## 2015-03-24 DIAGNOSIS — M955 Acquired deformity of pelvis: Secondary | ICD-10-CM | POA: Diagnosis not present

## 2015-03-24 LAB — CBC WITH DIFFERENTIAL/PLATELET
Basophils Absolute: 0.1 10*3/uL (ref 0.0–0.1)
Basophils Relative: 0.7 % (ref 0.0–3.0)
EOS ABS: 0.3 10*3/uL (ref 0.0–0.7)
Eosinophils Relative: 3.1 % (ref 0.0–5.0)
HCT: 44.8 % (ref 39.0–52.0)
HEMOGLOBIN: 14.9 g/dL (ref 13.0–17.0)
Lymphocytes Relative: 28.9 % (ref 12.0–46.0)
Lymphs Abs: 2.5 10*3/uL (ref 0.7–4.0)
MCHC: 33.3 g/dL (ref 30.0–36.0)
MCV: 89.7 fl (ref 78.0–100.0)
MONO ABS: 0.9 10*3/uL (ref 0.1–1.0)
Monocytes Relative: 10.2 % (ref 3.0–12.0)
Neutro Abs: 5 10*3/uL (ref 1.4–7.7)
Neutrophils Relative %: 57.1 % (ref 43.0–77.0)
Platelets: 229 10*3/uL (ref 150.0–400.0)
RBC: 4.99 Mil/uL (ref 4.22–5.81)
RDW: 12.9 % (ref 11.5–15.5)
WBC: 8.8 10*3/uL (ref 4.0–10.5)

## 2015-03-24 LAB — HEPATIC FUNCTION PANEL
ALBUMIN: 4.4 g/dL (ref 3.5–5.2)
ALK PHOS: 90 U/L (ref 39–117)
ALT: 21 U/L (ref 0–53)
AST: 19 U/L (ref 0–37)
Bilirubin, Direct: 0.1 mg/dL (ref 0.0–0.3)
TOTAL PROTEIN: 7 g/dL (ref 6.0–8.3)
Total Bilirubin: 0.6 mg/dL (ref 0.2–1.2)

## 2015-03-24 LAB — PSA, MEDICARE: PSA: 0.31 ng/ml (ref 0.10–4.00)

## 2015-03-24 LAB — BASIC METABOLIC PANEL
BUN: 14 mg/dL (ref 6–23)
CO2: 32 meq/L (ref 19–32)
Calcium: 9.6 mg/dL (ref 8.4–10.5)
Chloride: 103 mEq/L (ref 96–112)
Creatinine, Ser: 0.87 mg/dL (ref 0.40–1.50)
GFR: 92.58 mL/min (ref >60.00)
GLUCOSE: 126 mg/dL — AB (ref 70–99)
POTASSIUM: 4.7 meq/L (ref 3.5–5.1)
SODIUM: 140 meq/L (ref 135–145)

## 2015-03-24 LAB — LIPID PANEL
Cholesterol: 200 mg/dL (ref 0–200)
HDL: 57 mg/dL (ref >39.00)
LDL CALC: 113 mg/dL — AB (ref 0–99)
NONHDL: 142.75
Total CHOL/HDL Ratio: 4
Triglycerides: 149 mg/dL (ref 0.0–149.0)
VLDL: 29.8 mg/dL (ref 0.0–40.0)

## 2015-03-24 LAB — HEMOGLOBIN A1C: Hgb A1c MFr Bld: 6.4 % (ref 4.6–6.5)

## 2015-03-26 DIAGNOSIS — M62830 Muscle spasm of back: Secondary | ICD-10-CM | POA: Diagnosis not present

## 2015-03-26 DIAGNOSIS — M9905 Segmental and somatic dysfunction of pelvic region: Secondary | ICD-10-CM | POA: Diagnosis not present

## 2015-03-26 DIAGNOSIS — M955 Acquired deformity of pelvis: Secondary | ICD-10-CM | POA: Diagnosis not present

## 2015-03-26 DIAGNOSIS — M9903 Segmental and somatic dysfunction of lumbar region: Secondary | ICD-10-CM | POA: Diagnosis not present

## 2015-03-30 ENCOUNTER — Encounter: Payer: Self-pay | Admitting: Family Medicine

## 2015-03-30 ENCOUNTER — Ambulatory Visit (INDEPENDENT_AMBULATORY_CARE_PROVIDER_SITE_OTHER)
Admission: RE | Admit: 2015-03-30 | Discharge: 2015-03-30 | Disposition: A | Discharge status: Home / Self Care | Payer: Medicare Other | Source: Ambulatory Visit | Attending: Family Medicine | Admitting: Family Medicine

## 2015-03-30 ENCOUNTER — Ambulatory Visit (INDEPENDENT_AMBULATORY_CARE_PROVIDER_SITE_OTHER): Payer: Medicare Other | Admitting: Family Medicine

## 2015-03-30 VITALS — BP 130/84 | HR 67 | Temp 97.0°F | Ht 69.5 in | Wt 203.5 lb

## 2015-03-30 DIAGNOSIS — M25562 Pain in left knee: Secondary | ICD-10-CM

## 2015-03-30 DIAGNOSIS — Z0000 Encounter for general adult medical examination without abnormal findings: Secondary | ICD-10-CM | POA: Diagnosis not present

## 2015-03-30 DIAGNOSIS — M25551 Pain in right hip: Secondary | ICD-10-CM

## 2015-03-30 DIAGNOSIS — M1611 Unilateral primary osteoarthritis, right hip: Secondary | ICD-10-CM | POA: Diagnosis not present

## 2015-03-30 DIAGNOSIS — M1990 Unspecified osteoarthritis, unspecified site: Secondary | ICD-10-CM

## 2015-03-30 DIAGNOSIS — M7652 Patellar tendinitis, left knee: Secondary | ICD-10-CM | POA: Diagnosis not present

## 2015-03-30 DIAGNOSIS — M533 Sacrococcygeal disorders, not elsewhere classified: Secondary | ICD-10-CM

## 2015-03-30 DIAGNOSIS — M179 Osteoarthritis of knee, unspecified: Secondary | ICD-10-CM | POA: Diagnosis not present

## 2015-03-30 MED ORDER — DICLOFENAC SODIUM 75 MG PO TBEC: 75.0000 mg | DELAYED_RELEASE_TABLET | Freq: Two times a day (BID) | ORAL | Status: DC

## 2015-03-30 NOTE — Patient Instructions (Signed)

## 2015-03-30 NOTE — Progress Notes (Signed)
Pre visit review using our clinic review tool, if applicable. No additional management support is needed unless otherwise documented below in the visit note. 

## 2015-03-30 NOTE — Progress Notes (Signed)
Dr. Frederico Hamman T. Rakeya Glab, MD, [facility] [place], [address] Phone: [phone] Fax: [phone]  03/30/2015  Patient: Xavier Perry, MRN: [id number], DOB: [birth year], 70 y.o.  Primary Physician:  Owens Loffler, MD   Chief Complaint  Patient presents with  . Annual Exam    Medicare Wellness   Subjective:   Tayquan Gassman is a 70 y.o. pleasant patient who presents for a medicare wellness examination:  Preventative Health Maintenance Visit and multiple MSK complaints.   Health Maintenance Summary Reviewed and updated, unless pt declines services.  Tobacco History Reviewed. Alcohol: No concerns, no excessive use Exercise Habits: Plays golf 3 times a week, gym multiple days a week STD concerns: no risk or activity to increase risk Drug Use: None Encouraged self-testicular check  Health Maintenance  Topic Date Due  . Hepatitis C Screening  Mar 13, 1945  . TETANUS/TDAP  08/09/1965  . INFLUENZA VACCINE  04/03/2015 (Originally 08/04/2014)  . COLONOSCOPY  11/05/2017  . ZOSTAVAX  Completed  . PNA vac Low Risk Adult  Completed    Immunization History  Administered Date(s) Administered  . Influenza-Unspecified 11/03/2013  . Pneumococcal Conjugate-13 07/25/2013  . Pneumococcal Polysaccharide-23 07/18/2012  . Zoster 07/18/2012   Back pain: does a lot of biking and some treadmill work. He describes this as back pain, but he primarily points to the SI joint as the point of maximal tenderness.  He is not having any pain in the erector spinae complex.  He denies any radiculopathy, but he does have pain in the groin region on the right and pain in the upper thigh region adjacent to the groin.  Also L knee. Still having issues with his L knee. He had an intra-articular injection in the late fall and had about 3 or 4 months of relief from this, and he is having pain mostly on the medial aspect as well as on the patellar tendon.  He  notices this when he is active, he is in the gym, and when he is playing golf.  He does play with a knee brace, which does seem to help.  Also R hip pain. R groin pain, limits golf swing. He is having pain in the deep groin, and some limitations of motion.  Went to the chiropractor, did not help.   Patient Active Problem List   Diagnosis Date Noted  . Osteoarthritis of left knee 01/02/2015  . Varicose veins of leg with complications 63/33/5456  . Abdominal aneurysm without mention of rupture 09/19/2013  . Varicose veins of lower extremities with other complications 25/63/8937  . Personal history of colonic adenomas 08/30/2012  . GERD (gastroesophageal reflux disease) 09/19/2011  . History of repair of rotator cuff 09/19/2011  . Squamous cell carcinoma in situ of skin 09/19/2011   Past Medical History  Diagnosis Date  . GERD (gastroesophageal reflux disease) 09/19/2011  . History of repair of rotator cuff 09/19/2011    L 2009 and 2010  . Squamous cell carcinoma in situ of skin 09/19/2011  . Personal history of colonic adenomas 08/30/2012  . Basal cell carcinoma    Past Surgical History  Procedure Laterality Date  . Rotator cuff repair Left 2009 and 2010  . Tonsilectomy, adenoidectomy, bilateral myringotomy and tubes  1967  . Colonoscopy  08-30-2012   Social History   Social History  . Marital Status: Married    Spouse Name: N/A  . Number of Children: N/A  . Years of Education: N/A  Occupational History  . Not on file.   Social History Main Topics  . Smoking status: Current Every Day Smoker -- 0.50 packs/day    Types: Cigarettes  . Smokeless tobacco: Never Used  . Alcohol Use: 3.6 oz/week    3 Glasses of wine, 3 Standard drinks or equivalent per week     Comment: occasional   . Drug Use: No  . Sexual Activity: Not on file   Other Topics Concern  . Not on file   Social History Narrative   Family History  Problem Relation Age of Onset  . Colon cancer Neg Hx   .  Esophageal cancer Neg Hx   . Rectal cancer Neg Hx   . Stomach cancer Neg Hx   . Varicose Veins Mother   . Cancer Father    No Known Allergies  Medication list has been reviewed and updated.   General: Denies fever, chills, sweats. No significant weight loss. Eyes: Denies blurring,significant itching ENT: Denies earache, sore throat, and hoarseness. Cardiovascular: Denies chest pains, palpitations, dyspnea on exertion Respiratory: Denies cough, dyspnea at rest,wheeezing Breast: no concerns about lumps GI: Denies nausea, vomiting, diarrhea, constipation, change in bowel habits, abdominal pain, melena, hematochezia GU: Denies penile discharge, ED, urinary flow / outflow problems. No STD concerns. Musculoskeletal: AS ABOVE Derm: Denies rash, itching Neuro: Denies  paresthesias, frequent falls, frequent headaches Psych: Denies depression, anxiety Endocrine: Denies cold intolerance, heat intolerance, polydipsia Heme: Denies enlarged lymph nodes Allergy: No hayfever  Objective:   BP 130/84 mmHg  Pulse 67  Temp(Src) 97 F (36.1 C) (Oral)  Ht 5' 9.5" (1.765 m)  Wt 203 lb 8 oz (92.307 kg)  BMI 29.63 kg/m2  The patient completed a fall screen and PHQ-2 and PHQ-9 if necessary, which is documented in the EHR. The CMA/LPN/RN who assisted the patient verbally completed with them and documented results in [place].   Hearing Screening   Method: Audiometry   '125Hz'$  '250Hz'$  '500Hz'$  '1000Hz'$  '2000Hz'$  '4000Hz'$  '8000Hz'$   Right ear:         Left ear:         Comments: Has bilateral hearing aides  Vision Screening Comments: Wear Glasses-Eye Exam at [facility] 02/19/2015.  GEN: well developed, well nourished, no acute distress Eyes: conjunctiva and lids normal, PERRLA, EOMI ENT: TM clear, nares clear, oral exam WNL Neck: supple, no lymphadenopathy, no thyromegaly, no JVD Pulm: clear to auscultation and percussion, respiratory effort normal CV: regular rate and rhythm, S1-S2, no  murmur, rub or gallop, no bruits, peripheral pulses normal and symmetric, no cyanosis, clubbing, edema or varicosities GI: soft, non-tender; no hepatosplenomegaly, masses; active bowel sounds all quadrants GU: no hernia, testicular mass, penile discharge Lymph: no cervical, axillary or inguinal adenopathy   HIP EXAM: SIDE: R ROM: Abduction, Flexion, Internal and External range of motion: fairly decent abduction with approximate 10-15 loss of motion compared to the contralateral side.  Compared to the contralateral side, patient has dramatically decreased internal range of motion and external range of motion.  Approximate 15 of total rotational movement is possible. Pain with terminal IROM and EROM: with IROM + C sign GTB: NT SLR: NEG Knees: No effusion FABER: NT REVERSE FABER: NT, neg Piriformis: NT at direct palpation Str: flexion: 5/5 abduction: 5/5 adduction: 5/5 Strength testing non-tender  TTP B at SI joints SLR neg  Knee:  L Gait: Normal heel toe pattern ROM: 0-120 Effusion: neg Echymosis or edema: none Patellar tendon TTP Painful PLICA: neg Patellar  grind: negative Medial and lateral patellar facet loading: negative medial and lateral joint lines: medial joint line tenderness Mcmurray's neg Flexion-pinch neg Varus and valgus stress: stable Lachman: neg Ant and Post drawer: neg Hip abduction, IR, ER: WNL Hip flexion str: 5/5 Hip abd: 5/5 Quad: 5/5 VMO atrophy:No Hamstring concentric and eccentric: 5/5   SKIN: clear, good turgor, color WNL, no rashes, lesions, or ulcerations Neuro: normal mental status, normal strength, sensation, and motion Psych: alert; oriented to person, place and time, normally interactive and not anxious or depressed in appearance.  All labs reviewed with patient.  Lipids:    Component Value Date/Time   CHOL 200 03/24/2015 0859   TRIG 149.0 03/24/2015 0859   HDL 57.00 03/24/2015 0859   LDLDIRECT 150.0 07/10/2012 0847   VLDL 29.8  03/24/2015 0859   CHOLHDL 4 03/24/2015 0859   CBC: CBC Latest Ref Rng 03/24/2015 07/11/2013 07/10/2012  WBC 4.0 - 10.5 K/uL 8.8 8.9 7.4  Hemoglobin 13.0 - 17.0 g/dL 14.9 15.0 14.8  Hematocrit 39.0 - 52.0 % 44.8 44.6 44.0  Platelets 150.0 - 400.0 K/uL 229.0 249.0 256.3    Basic Metabolic Panel:    Component Value Date/Time   NA 140 03/24/2015 0859   K 4.7 03/24/2015 0859   CL 103 03/24/2015 0859   CO2 32 03/24/2015 0859   BUN 14 03/24/2015 0859   CREATININE 0.87 03/24/2015 0859   GLUCOSE 126* 03/24/2015 0859   CALCIUM 9.6 03/24/2015 0859   Hepatic Function Latest Ref Rng 03/24/2015 07/11/2013 07/10/2012  Total Protein 6.0 - 8.3 g/dL 7.0 7.2 7.1  Albumin 3.5 - 5.2 g/dL 4.4 4.2 4.1  AST 0 - 37 U/L _0 ALT 0 - 53 U/L 21 25 35  Alk Phosphatase 39 - 117 U/L 90 96 118(H)  Total Bilirubin 0.2 - 1.2 mg/dL 0.6 0.8 0.7  Bilirubin, Direct 0.0 - 0.3 mg/dL 0.1 0.2 0.1    No results found for: TSH Lab Results  Component Value Date   PSA 0.31 03/24/2015   PSA 0.29 07/11/2013   PSA 0.29 07/10/2012   Dg Hip Unilat With Pelvis 2-3 Views Right  03/31/2015  CLINICAL DATA:  Right hip pain. No reported injury. Initial evaluation. EXAM: DG HIP (WITH OR WITHOUT PELVIS) 2-3V RIGHT COMPARISON:  No prior. FINDINGS: No acute bony or joint abnormality identified. Degenerative changes lumbar spine and both hips. IMPRESSION: Mild degenerative changes lumbar spine and both hips. No acute abnormality. Electronically Signed   By: Marcello Moores  Register   On: 03/31/2015 07:46   Dg Knee Ap/lat W/sunrise Left  03/31/2015  CLINICAL DATA:  Left knee pain for 3 months EXAM: LEFT KNEE 3 VIEWS COMPARISON:  01/12/2012 FINDINGS: Try compartment joint space narrowing and sharpening the tibial spines noted. No joint effusion. No fracture or subluxation. IMPRESSION: 1. Osteoarthritis. 2. No acute findings. Electronically Signed   By: Kerby Moors M.D.   On: 03/31/2015 07:52     Assessment and Plan:   Routine general  medical examination at a health care facility  Left knee pain - Plan: DG Knee AP/LAT W/Sunrise Left, Ambulatory referral to Physical Therapy  Right hip pain - Plan: DG HIP UNILAT WITH PELVIS 2-3 VIEWS RIGHT, Ambulatory referral to Physical Therapy  Patellar tendinitis, left - Plan: Ambulatory referral to Physical Therapy  Arthritis of right hip - Plan: Ambulatory referral to Physical Therapy  SI (sacroiliac) joint dysfunction - Plan: Ambulatory referral to Physical Therapy  >15 minutes spent in face to face time with  patient, >50% spent in counselling or coordination of care: [place] above time spent directly on health maintenance.  Right hip, on my independent interpretation, consistent with moderate osteoarthritic change on the right hip.  15 of motion.  This is limiting his golf swing.  Trial of physical therapy and change to oral Voltaren.  Left knee, osteoarthritic change with greatest findings in the medial compartment as well as some in the patellofemoral compartment.  Think that much of his pain now is secondary to patellar tendinitis, and we are going to try to do some physical therapy along with some iontophoresis.  What he describes his back pain appears to be primarily SI joint dysfunction combined with osteoarthritis of the right hip.  Health Maintenance Exam: The patient's preventative maintenance and recommended screening tests for an annual wellness exam were reviewed in full today. Brought up to date unless services declined.  Counselled on the importance of diet, exercise, and its role in overall health and mortality. The patient's FH and SH was reviewed, including their home life, tobacco status, and drug and alcohol status.  I have personally reviewed the Medicare Annual Wellness questionnaire and have noted 1. The patient's medical and social history 2. Their use of alcohol, tobacco or illicit drugs 3. Their current medications and supplements 4. The patient's  functional ability including ADL's, fall risks, home safety risks and hearing or visual             impairment. 5. Diet and physical activities 6. Evidence for depression or mood disorders 7. Reviewed Updated provider list, see scanned forms and [facility] Snapshot.   The patients weight, height, BMI and visual acuity have been recorded in the chart I have made referrals, counseling and provided education to the patient based review of the above and I have provided the pt with a written personalized care plan for preventive services.  I have provided the patient with a copy of your personalized plan for preventive services. Instructed to take the time to review along with their updated medication list.  Follow-up: No Follow-up on file. Or follow-up in 1 year for complete physical examination  New Prescriptions   DICLOFENAC (VOLTAREN) 75 MG EC TABLET    Take 1 tablet (75 mg total) by mouth 2 (two) times daily.   Orders Placed This Encounter  Procedures  . DG HIP UNILAT WITH PELVIS 2-3 VIEWS RIGHT  . DG Knee AP/LAT W/Sunrise Left  . Ambulatory referral to Physical Therapy    Signed,  Frederico Hamman T. Kriss Ishler, MD   Patient's Medications  New Prescriptions   DICLOFENAC (VOLTAREN) 75 MG EC TABLET    Take 1 tablet (75 mg total) by mouth 2 (two) times daily.  Previous Medications   NAPROXEN SODIUM (ALEVE) 220 MG TABLET    Take 220 mg by mouth 2 (two) times daily as needed.   OMEPRAZOLE (PRILOSEC) 20 MG CAPSULE    Take 1 capsule (20 mg total) by mouth daily.  Modified Medications   No medications on file  Discontinued Medications   No medications on file

## 2015-03-31 DIAGNOSIS — M25551 Pain in right hip: Secondary | ICD-10-CM | POA: Diagnosis not present

## 2015-03-31 DIAGNOSIS — M25562 Pain in left knee: Secondary | ICD-10-CM | POA: Diagnosis not present

## 2015-04-02 DIAGNOSIS — M25551 Pain in right hip: Secondary | ICD-10-CM | POA: Diagnosis not present

## 2015-04-02 DIAGNOSIS — M25562 Pain in left knee: Secondary | ICD-10-CM | POA: Diagnosis not present

## 2015-04-04 DIAGNOSIS — M25551 Pain in right hip: Secondary | ICD-10-CM | POA: Diagnosis not present

## 2015-04-04 DIAGNOSIS — M25562 Pain in left knee: Secondary | ICD-10-CM | POA: Diagnosis not present

## 2015-04-07 DIAGNOSIS — M25551 Pain in right hip: Secondary | ICD-10-CM | POA: Diagnosis not present

## 2015-04-07 DIAGNOSIS — M25562 Pain in left knee: Secondary | ICD-10-CM | POA: Diagnosis not present

## 2015-04-09 DIAGNOSIS — M25562 Pain in left knee: Secondary | ICD-10-CM | POA: Diagnosis not present

## 2015-04-09 DIAGNOSIS — M25551 Pain in right hip: Secondary | ICD-10-CM | POA: Diagnosis not present

## 2015-04-14 DIAGNOSIS — M25562 Pain in left knee: Secondary | ICD-10-CM | POA: Diagnosis not present

## 2015-04-14 DIAGNOSIS — M25551 Pain in right hip: Secondary | ICD-10-CM | POA: Diagnosis not present

## 2015-04-16 DIAGNOSIS — M25551 Pain in right hip: Secondary | ICD-10-CM | POA: Diagnosis not present

## 2015-04-16 DIAGNOSIS — M25562 Pain in left knee: Secondary | ICD-10-CM | POA: Diagnosis not present

## 2015-04-21 DIAGNOSIS — M25551 Pain in right hip: Secondary | ICD-10-CM | POA: Diagnosis not present

## 2015-04-21 DIAGNOSIS — M25562 Pain in left knee: Secondary | ICD-10-CM | POA: Diagnosis not present

## 2015-04-23 DIAGNOSIS — M25562 Pain in left knee: Secondary | ICD-10-CM | POA: Diagnosis not present

## 2015-04-23 DIAGNOSIS — M25551 Pain in right hip: Secondary | ICD-10-CM | POA: Diagnosis not present

## 2015-05-19 DIAGNOSIS — M62830 Muscle spasm of back: Secondary | ICD-10-CM | POA: Diagnosis not present

## 2015-05-19 DIAGNOSIS — M9903 Segmental and somatic dysfunction of lumbar region: Secondary | ICD-10-CM | POA: Diagnosis not present

## 2015-05-19 DIAGNOSIS — M955 Acquired deformity of pelvis: Secondary | ICD-10-CM | POA: Diagnosis not present

## 2015-05-19 DIAGNOSIS — M9905 Segmental and somatic dysfunction of pelvic region: Secondary | ICD-10-CM | POA: Diagnosis not present

## 2015-05-20 DIAGNOSIS — M9903 Segmental and somatic dysfunction of lumbar region: Secondary | ICD-10-CM | POA: Diagnosis not present

## 2015-05-20 DIAGNOSIS — M9905 Segmental and somatic dysfunction of pelvic region: Secondary | ICD-10-CM | POA: Diagnosis not present

## 2015-05-20 DIAGNOSIS — M955 Acquired deformity of pelvis: Secondary | ICD-10-CM | POA: Diagnosis not present

## 2015-05-20 DIAGNOSIS — M62830 Muscle spasm of back: Secondary | ICD-10-CM | POA: Diagnosis not present

## 2015-05-21 DIAGNOSIS — M9905 Segmental and somatic dysfunction of pelvic region: Secondary | ICD-10-CM | POA: Diagnosis not present

## 2015-05-21 DIAGNOSIS — M9903 Segmental and somatic dysfunction of lumbar region: Secondary | ICD-10-CM | POA: Diagnosis not present

## 2015-05-21 DIAGNOSIS — M62830 Muscle spasm of back: Secondary | ICD-10-CM | POA: Diagnosis not present

## 2015-05-21 DIAGNOSIS — M955 Acquired deformity of pelvis: Secondary | ICD-10-CM | POA: Diagnosis not present

## 2015-06-04 DIAGNOSIS — M62830 Muscle spasm of back: Secondary | ICD-10-CM | POA: Diagnosis not present

## 2015-06-04 DIAGNOSIS — M955 Acquired deformity of pelvis: Secondary | ICD-10-CM | POA: Diagnosis not present

## 2015-06-04 DIAGNOSIS — M9903 Segmental and somatic dysfunction of lumbar region: Secondary | ICD-10-CM | POA: Diagnosis not present

## 2015-06-04 DIAGNOSIS — M9905 Segmental and somatic dysfunction of pelvic region: Secondary | ICD-10-CM | POA: Diagnosis not present

## 2015-06-06 DIAGNOSIS — M62830 Muscle spasm of back: Secondary | ICD-10-CM | POA: Diagnosis not present

## 2015-06-06 DIAGNOSIS — M955 Acquired deformity of pelvis: Secondary | ICD-10-CM | POA: Diagnosis not present

## 2015-06-06 DIAGNOSIS — M9905 Segmental and somatic dysfunction of pelvic region: Secondary | ICD-10-CM | POA: Diagnosis not present

## 2015-06-06 DIAGNOSIS — M9903 Segmental and somatic dysfunction of lumbar region: Secondary | ICD-10-CM | POA: Diagnosis not present

## 2015-06-08 DIAGNOSIS — M62830 Muscle spasm of back: Secondary | ICD-10-CM | POA: Diagnosis not present

## 2015-06-08 DIAGNOSIS — M9903 Segmental and somatic dysfunction of lumbar region: Secondary | ICD-10-CM | POA: Diagnosis not present

## 2015-06-08 DIAGNOSIS — M955 Acquired deformity of pelvis: Secondary | ICD-10-CM | POA: Diagnosis not present

## 2015-06-08 DIAGNOSIS — M9905 Segmental and somatic dysfunction of pelvic region: Secondary | ICD-10-CM | POA: Diagnosis not present

## 2015-06-09 DIAGNOSIS — M62830 Muscle spasm of back: Secondary | ICD-10-CM | POA: Diagnosis not present

## 2015-06-09 DIAGNOSIS — M955 Acquired deformity of pelvis: Secondary | ICD-10-CM | POA: Diagnosis not present

## 2015-06-09 DIAGNOSIS — M9905 Segmental and somatic dysfunction of pelvic region: Secondary | ICD-10-CM | POA: Diagnosis not present

## 2015-06-09 DIAGNOSIS — M9903 Segmental and somatic dysfunction of lumbar region: Secondary | ICD-10-CM | POA: Diagnosis not present

## 2015-06-11 DIAGNOSIS — M9905 Segmental and somatic dysfunction of pelvic region: Secondary | ICD-10-CM | POA: Diagnosis not present

## 2015-06-11 DIAGNOSIS — M955 Acquired deformity of pelvis: Secondary | ICD-10-CM | POA: Diagnosis not present

## 2015-06-11 DIAGNOSIS — M62830 Muscle spasm of back: Secondary | ICD-10-CM | POA: Diagnosis not present

## 2015-06-11 DIAGNOSIS — M9903 Segmental and somatic dysfunction of lumbar region: Secondary | ICD-10-CM | POA: Diagnosis not present

## 2015-06-23 DIAGNOSIS — M1611 Unilateral primary osteoarthritis, right hip: Secondary | ICD-10-CM | POA: Diagnosis not present

## 2015-06-23 DIAGNOSIS — M9903 Segmental and somatic dysfunction of lumbar region: Secondary | ICD-10-CM | POA: Diagnosis not present

## 2015-06-23 DIAGNOSIS — M955 Acquired deformity of pelvis: Secondary | ICD-10-CM | POA: Diagnosis not present

## 2015-06-23 DIAGNOSIS — M62830 Muscle spasm of back: Secondary | ICD-10-CM | POA: Diagnosis not present

## 2015-06-23 DIAGNOSIS — M9905 Segmental and somatic dysfunction of pelvic region: Secondary | ICD-10-CM | POA: Diagnosis not present

## 2015-06-24 DIAGNOSIS — M9905 Segmental and somatic dysfunction of pelvic region: Secondary | ICD-10-CM | POA: Diagnosis not present

## 2015-06-24 DIAGNOSIS — M62830 Muscle spasm of back: Secondary | ICD-10-CM | POA: Diagnosis not present

## 2015-06-24 DIAGNOSIS — M955 Acquired deformity of pelvis: Secondary | ICD-10-CM | POA: Diagnosis not present

## 2015-06-24 DIAGNOSIS — M9903 Segmental and somatic dysfunction of lumbar region: Secondary | ICD-10-CM | POA: Diagnosis not present

## 2015-06-25 DIAGNOSIS — M955 Acquired deformity of pelvis: Secondary | ICD-10-CM | POA: Diagnosis not present

## 2015-06-25 DIAGNOSIS — M9905 Segmental and somatic dysfunction of pelvic region: Secondary | ICD-10-CM | POA: Diagnosis not present

## 2015-06-25 DIAGNOSIS — M9903 Segmental and somatic dysfunction of lumbar region: Secondary | ICD-10-CM | POA: Diagnosis not present

## 2015-06-25 DIAGNOSIS — M62830 Muscle spasm of back: Secondary | ICD-10-CM | POA: Diagnosis not present

## 2015-06-27 DIAGNOSIS — M9905 Segmental and somatic dysfunction of pelvic region: Secondary | ICD-10-CM | POA: Diagnosis not present

## 2015-06-27 DIAGNOSIS — M955 Acquired deformity of pelvis: Secondary | ICD-10-CM | POA: Diagnosis not present

## 2015-06-27 DIAGNOSIS — M62830 Muscle spasm of back: Secondary | ICD-10-CM | POA: Diagnosis not present

## 2015-06-27 DIAGNOSIS — M9903 Segmental and somatic dysfunction of lumbar region: Secondary | ICD-10-CM | POA: Diagnosis not present

## 2015-06-30 DIAGNOSIS — M25551 Pain in right hip: Secondary | ICD-10-CM | POA: Diagnosis not present

## 2015-06-30 DIAGNOSIS — M1611 Unilateral primary osteoarthritis, right hip: Secondary | ICD-10-CM | POA: Diagnosis not present

## 2015-10-13 DIAGNOSIS — M25551 Pain in right hip: Secondary | ICD-10-CM | POA: Diagnosis not present

## 2015-10-13 DIAGNOSIS — M1611 Unilateral primary osteoarthritis, right hip: Secondary | ICD-10-CM | POA: Diagnosis not present

## 2016-01-12 DIAGNOSIS — L821 Other seborrheic keratosis: Secondary | ICD-10-CM | POA: Diagnosis not present

## 2016-01-12 DIAGNOSIS — L570 Actinic keratosis: Secondary | ICD-10-CM | POA: Diagnosis not present

## 2016-03-03 DIAGNOSIS — L2089 Other atopic dermatitis: Secondary | ICD-10-CM | POA: Diagnosis not present

## 2016-05-04 DIAGNOSIS — N390 Urinary tract infection, site not specified: Secondary | ICD-10-CM | POA: Diagnosis not present

## 2016-05-05 ENCOUNTER — Ambulatory Visit: Payer: Medicare Other | Admitting: Family Medicine

## 2016-07-24 DIAGNOSIS — J3089 Other allergic rhinitis: Secondary | ICD-10-CM | POA: Diagnosis not present

## 2016-07-24 DIAGNOSIS — R05 Cough: Secondary | ICD-10-CM | POA: Diagnosis not present

## 2016-08-08 ENCOUNTER — Other Ambulatory Visit: Payer: Self-pay | Admitting: Family Medicine

## 2016-08-08 DIAGNOSIS — Z125 Encounter for screening for malignant neoplasm of prostate: Secondary | ICD-10-CM

## 2016-08-08 DIAGNOSIS — Z1159 Encounter for screening for other viral diseases: Secondary | ICD-10-CM

## 2016-08-08 DIAGNOSIS — Z13220 Encounter for screening for lipoid disorders: Secondary | ICD-10-CM

## 2016-08-08 DIAGNOSIS — Z79899 Other long term (current) drug therapy: Secondary | ICD-10-CM

## 2016-08-11 ENCOUNTER — Other Ambulatory Visit: Payer: Medicare Other

## 2016-08-11 ENCOUNTER — Ambulatory Visit (INDEPENDENT_AMBULATORY_CARE_PROVIDER_SITE_OTHER): Payer: Medicare Other

## 2016-08-11 VITALS — BP 132/82 | HR 55 | Temp 97.9°F | Ht 69.0 in | Wt 204.5 lb

## 2016-08-11 DIAGNOSIS — Z79899 Other long term (current) drug therapy: Secondary | ICD-10-CM | POA: Diagnosis not present

## 2016-08-11 DIAGNOSIS — Z1159 Encounter for screening for other viral diseases: Secondary | ICD-10-CM

## 2016-08-11 DIAGNOSIS — Z125 Encounter for screening for malignant neoplasm of prostate: Secondary | ICD-10-CM | POA: Diagnosis not present

## 2016-08-11 DIAGNOSIS — Z0000 Encounter for general adult medical examination without abnormal findings: Secondary | ICD-10-CM

## 2016-08-11 DIAGNOSIS — Z13220 Encounter for screening for lipoid disorders: Secondary | ICD-10-CM | POA: Diagnosis not present

## 2016-08-11 LAB — BASIC METABOLIC PANEL
BUN: 14 mg/dL (ref 6–23)
CO2: 31 meq/L (ref 19–32)
Calcium: 9.4 mg/dL (ref 8.4–10.5)
Chloride: 102 mEq/L (ref 96–112)
Creatinine, Ser: 0.84 mg/dL (ref 0.40–1.50)
GFR: 96.01 mL/min (ref >60.00)
GLUCOSE: 102 mg/dL — AB (ref 70–99)
Potassium: 4.4 mEq/L (ref 3.5–5.1)
SODIUM: 137 meq/L (ref 135–145)

## 2016-08-11 LAB — CBC WITH DIFFERENTIAL/PLATELET
Basophils Absolute: 0.1 10*3/uL (ref 0.0–0.1)
Basophils Relative: 0.7 % (ref 0.0–3.0)
EOS PCT: 3 % (ref 0.0–5.0)
Eosinophils Absolute: 0.2 10*3/uL (ref 0.0–0.7)
HCT: 43.1 % (ref 39.0–52.0)
Hemoglobin: 14.4 g/dL (ref 13.0–17.0)
LYMPHS ABS: 2.4 10*3/uL (ref 0.7–4.0)
Lymphocytes Relative: 30.3 % (ref 12.0–46.0)
MCHC: 33.5 g/dL (ref 30.0–36.0)
MCV: 92.9 fl (ref 78.0–100.0)
MONOS PCT: 6.8 % (ref 3.0–12.0)
Monocytes Absolute: 0.5 10*3/uL (ref 0.1–1.0)
NEUTROS ABS: 4.6 10*3/uL (ref 1.4–7.7)
NEUTROS PCT: 59.2 % (ref 43.0–77.0)
PLATELETS: 242 10*3/uL (ref 150.0–400.0)
RBC: 4.64 Mil/uL (ref 4.22–5.81)
RDW: 13.1 % (ref 11.5–15.5)
WBC: 7.8 10*3/uL (ref 4.0–10.5)

## 2016-08-11 LAB — LIPID PANEL
Cholesterol: 200 mg/dL (ref 0–200)
HDL: 58.8 mg/dL (ref >39.00)
LDL Cholesterol: 106 mg/dL — ABNORMAL HIGH (ref 0–99)
NONHDL: 140.94
Total CHOL/HDL Ratio: 3
Triglycerides: 174 mg/dL — ABNORMAL HIGH (ref 0.0–149.0)
VLDL: 34.8 mg/dL (ref 0.0–40.0)

## 2016-08-11 LAB — HEPATIC FUNCTION PANEL
ALT: 23 U/L (ref 0–53)
AST: 21 U/L (ref 0–37)
Albumin: 4.3 g/dL (ref 3.5–5.2)
Alkaline Phosphatase: 89 U/L (ref 39–117)
BILIRUBIN DIRECT: 0.1 mg/dL (ref 0.0–0.3)
BILIRUBIN TOTAL: 0.7 mg/dL (ref 0.2–1.2)
Total Protein: 6.9 g/dL (ref 6.0–8.3)

## 2016-08-11 LAB — PSA, MEDICARE: PSA: 0.29 ng/ml (ref 0.10–4.00)

## 2016-08-11 NOTE — Progress Notes (Signed)
I reviewed health advisor's note, was available for consultation, and agree with documentation and plan.  

## 2016-08-11 NOTE — Progress Notes (Signed)
Pre visit review using our clinic review tool, if applicable. No additional management support is needed unless otherwise documented below in the visit note. 

## 2016-08-11 NOTE — Progress Notes (Signed)
Subjective:   Xavier Perry is a 71 y.o. male who presents for a Subsequentl Medicare Annual Wellness Visit.  Review of Systems  N/A Cardiac Risk Factors include: advanced age (>17men, >29 women);male gender    Objective:    Today's Vitals   08/11/16 1434  BP: 132/82  Pulse: (!) 55  Temp: 97.9 F (36.6 C)  TempSrc: Oral  SpO2: 97%  Weight: 204 lb 8 oz (92.8 kg)  Height: 5\' 9"  (1.753 m)  PainSc: 0-No pain   Body mass index is 30.2 kg/m.  Current Medications (verified) Outpatient Encounter Prescriptions as of 08/11/2016  Medication Sig  . Naproxen Sodium (ALEVE PO) Take 1 tablet by mouth as needed.  Marland Kitchen omeprazole (PRILOSEC) 20 MG capsule Take 1 capsule (20 mg total) by mouth daily. (Patient taking differently: Take 20 mg by mouth as needed. )  . [DISCONTINUED] diclofenac (VOLTAREN) 75 MG EC tablet Take 1 tablet (75 mg total) by mouth 2 (two) times daily.  . [DISCONTINUED] naproxen sodium (ALEVE) 220 MG tablet Take 220 mg by mouth 2 (two) times daily as needed.   No facility-administered encounter medications on file as of 08/11/2016.     Allergies (verified) Patient has no known allergies.   History: Past Medical History:  Diagnosis Date  . Basal cell carcinoma   . GERD (gastroesophageal reflux disease) 09/19/2011  . History of repair of rotator cuff 09/19/2011   L 2009 and 2010  . Personal history of colonic adenomas 08/30/2012  . Squamous cell carcinoma in situ of skin 09/19/2011   Past Surgical History:  Procedure Laterality Date  . BASAL CELL CARCINOMA EXCISION    . COLONOSCOPY  08-30-2012  . ROTATOR CUFF REPAIR Left 2009 and 2010  . TONSILECTOMY, ADENOIDECTOMY, BILATERAL MYRINGOTOMY AND TUBES  1967   Family History  Problem Relation Age of Onset  . Varicose Veins Mother   . Cancer Father   . Colon cancer Neg Hx   . Esophageal cancer Neg Hx   . Rectal cancer Neg Hx   . Stomach cancer Neg Hx    Social History   Occupational History  . Not on file.    Social History Main Topics  . Smoking status: Current Every Day Smoker    Packs/day: 0.50    Types: Cigarettes  . Smokeless tobacco: Never Used     Comment: pt will discuss tobacco cessation with PCP  . Alcohol use 10.2 oz/week    3 Standard drinks or equivalent, 14 Glasses of wine per week     Comment: 2 glasses of wine daily  . Drug use: No  . Sexual activity: Not Currently   Tobacco Counseling Ready to quit: Yes Counseling given: No   Activities of Daily Living In your present state of health, do you have any difficulty performing the following activities: 08/11/2016  Hearing? Y  Vision? N  Difficulty concentrating or making decisions? N  Walking or climbing stairs? N  Dressing or bathing? N  Doing errands, shopping? N  Preparing Food and eating ? N  Using the Toilet? N  In the past six months, have you accidently leaked urine? N  Do you have problems with loss of bowel control? N  Managing your Medications? N  Managing your Finances? N  Housekeeping or managing your Housekeeping? N  Some recent data might be hidden    Immunizations and Health Maintenance Immunization History  Administered Date(s) Administered  . Influenza-Unspecified 11/03/2013  . Pneumococcal Conjugate-13 07/25/2013  . Pneumococcal Polysaccharide-23 07/18/2012  .  Zoster 07/18/2012   There are no preventive care reminders to display for this patient.  Patient Care Team: Owens Loffler, MD as PCP - General (Family Medicine)    Assessment:   This is a routine wellness examination for Xavier Perry.   Hearing/Vision screen Hearing Screening Comments: Pt has bilateral hearing aids but does not wear them Vision Screening Comments: Last vision exam in December 2017  Dietary issues and exercise activities discussed: Current Exercise Habits: Home exercise routine, Type of exercise: Other - see comments;walking (golfing 3 days per week; yardwork ), Time (Minutes): > 60, Frequency (Times/Week): 4, Weekly  Exercise (Minutes/Week): 0, Intensity: Moderate, Exercise limited by: None identified  Goals    . Increase physical activity          Starting 08/11/2016, I will continue to golf for at least 5 hours 3-4 days per week and do yard work as needed.       Depression Screen PHQ 2/9 Scores 08/11/2016 03/30/2015 07/25/2013  PHQ - 2 Score 0 0 0    Fall Risk Fall Risk  08/11/2016 03/30/2015 07/25/2013  Falls in the past year? No No No    Cognitive Function: MMSE - Mini Mental State Exam 08/11/2016  Orientation to time 5  Orientation to Place 5  Registration 3  Attention/ Calculation 0  Recall 3  Language- name 2 objects 0  Language- repeat 1  Language- follow 3 step command 3  Language- read & follow direction 0  Write a sentence 0  Copy design 0  Total score 20     PLEASE NOTE: A Mini-Cog screen was completed. Maximum score is 20. A value of 0 denotes this part of Folstein MMSE was not completed or the patient failed this part of the Mini-Cog screening.   Mini-Cog Screening Orientation to Time - Max 5 pts Orientation to Place - Max 5 pts Registration - Max 3 pts Recall - Max 3 pts Language Repeat - Max 1 pts Language Follow 3 Step Command - Max 3 pts     Screening Tests Health Maintenance  Topic Date Due  . INFLUENZA VACCINE  04/02/2017 (Originally 08/03/2016)  . TETANUS/TDAP  08/12/2026 (Originally 08/09/1964)  . COLONOSCOPY  11/05/2017  . Hepatitis C Screening  Completed  . PNA vac Low Risk Adult  Completed        Plan:     I have personally reviewed and addressed the Medicare Annual Wellness questionnaire and have noted the following in the patient's chart:  A. Medical and social history B. Use of alcohol, tobacco or illicit drugs  C. Current medications and supplements D. Functional ability and status E.  Nutritional status F.  Physical activity G. Advance directives H. List of other physicians I.  Hospitalizations, surgeries, and ER visits in previous 12 months J.    [place] to include hearing, vision, cognitive, depression L. Referrals and appointments - none  In addition, I have reviewed and discussed with patient certain preventive protocols, quality metrics, and best practice recommendations. A written personalized care plan for preventive services as well as general preventive health recommendations were provided to patient.  See attached scanned questionnaire for additional information.   Signed,   Lindell Noe, MHA, BS, LPN Health Coach

## 2016-08-11 NOTE — Progress Notes (Signed)
PCP notes:   Health maintenance:  Flu vaccine - addressed Tetanus - postponed/insurance Hep C screening - completed  Abnormal screenings:   None  Patient concerns:   A. Patient wants to discuss tobacco cessation.  B. Patient has complaint of intermittent tingling and numbness in fingertips. Onset approx. 3 mths ago.  Nurse concerns:  None  Next PCP appt:   08/18/16 @ 1045

## 2016-08-11 NOTE — Patient Instructions (Signed)
[name] , Thank you for taking time to come for your Medicare Wellness Visit. I appreciate your ongoing commitment to your health goals. Please review the following plan we discussed and let me know if I can assist you in the future.   These are the goals we discussed: Goals    . Increase physical activity          Starting 08/11/2016, I will continue to golf for at least 5 hours 3-4 days per week and do yard work as needed.        This is a list of the screening recommended for you and due dates:  Health Maintenance  Topic Date Due  . Flu Shot  04/02/2017*  . Tetanus Vaccine  08/12/2026*  . Colon Cancer Screening  11/05/2017  .  Hepatitis C: One time screening is recommended by Center for Disease Control  (CDC) for  adults born from [birth year] through 1964.   Completed  . Pneumonia vaccines  Completed  *Topic was postponed. The date shown is not the original due date.   Preventive Care for Adults  A healthy lifestyle and preventive care can promote health and wellness. Preventive health guidelines for adults include the following key practices.  . A routine yearly physical is a good way to check with your health care provider about your health and preventive screening. It is a chance to share any concerns and updates on your health and to receive a thorough exam.  . Visit your dentist for a routine exam and preventive care every 6 months. Brush your teeth twice a day and floss once a day. Good oral hygiene prevents tooth decay and gum disease.  . The frequency of eye exams is based on your age, health, family medical history, use  of contact lenses, and other factors. Follow your health care provider's ecommendations for frequency of eye exams.  . Eat a healthy diet. Foods like vegetables, fruits, whole grains, low-fat dairy products, and lean protein foods contain the nutrients you need without too many calories. Decrease your intake of foods high in solid fats, added sugars, and salt.  Eat the right amount of calories for you. Get information about a proper diet from your health care provider, if necessary.  . Regular physical exercise is one of the most important things you can do for your health. Most adults should get at least 150 minutes of moderate-intensity exercise (any activity that increases your heart rate and causes you to sweat) each week. In addition, most adults need muscle-strengthening exercises on 2 or more days a week.  Silver Sneakers may be a benefit available to you. To determine eligibility, you may visit the website: www.silversneakers.com or contact program at 228-016-3093 Mon-Fri between 8AM-8PM.   . Maintain a healthy weight. The body mass index (BMI) is a screening tool to identify possible weight problems. It provides an estimate of body fat based on height and weight. Your health care provider can find your BMI and can help you achieve or maintain a healthy weight.   For adults 20 years and older: ? A BMI below 18.5 is considered underweight. ? A BMI of 18.5 to 24.9 is normal. ? A BMI of 25 to 29.9 is considered overweight. ? A BMI of 30 and above is considered obese.   . Maintain normal blood lipids and cholesterol levels by exercising and minimizing your intake of saturated fat. Eat a balanced diet with plenty of fruit and vegetables. Blood tests for lipids and  cholesterol should begin at age 72 and be repeated every 5 years. If your lipid or cholesterol levels are high, you are over 50, or you are at high risk for heart disease, you may need your cholesterol levels checked more frequently. Ongoing high lipid and cholesterol levels should be treated with medicines if diet and exercise are not working.  . If you smoke, find out from your health care provider how to quit. If you do not use tobacco, please do not start.  . If you choose to drink alcohol, please do not consume more than 2 drinks per day. One drink is considered to be 12 ounces (355  mL) of beer, 5 ounces (148 mL) of wine, or 1.5 ounces (44 mL) of liquor.  . If you are 32-9 years old, ask your health care provider if you should take aspirin to prevent strokes.  . Use sunscreen. Apply sunscreen liberally and repeatedly throughout the day. You should seek shade when your shadow is shorter than you. Protect yourself by wearing long sleeves, pants, a wide-brimmed hat, and sunglasses year round, whenever you are outdoors.  . Once a month, do a whole body skin exam, using a mirror to look at the skin on your back. Tell your health care provider of new moles, moles that have irregular borders, moles that are larger than a pencil eraser, or moles that have changed in shape or color.

## 2016-08-12 LAB — HEPATITIS C ANTIBODY: HCV AB: NONREACTIVE

## 2016-08-18 ENCOUNTER — Encounter: Payer: Self-pay | Admitting: Family Medicine

## 2016-08-18 ENCOUNTER — Ambulatory Visit (INDEPENDENT_AMBULATORY_CARE_PROVIDER_SITE_OTHER): Payer: Medicare Other | Admitting: Family Medicine

## 2016-08-18 VITALS — BP 112/72 | HR 72 | Temp 98.3°F | Ht 69.0 in | Wt 205.8 lb

## 2016-08-18 DIAGNOSIS — R7303 Prediabetes: Secondary | ICD-10-CM

## 2016-08-18 DIAGNOSIS — K219 Gastro-esophageal reflux disease without esophagitis: Secondary | ICD-10-CM | POA: Diagnosis not present

## 2016-08-18 DIAGNOSIS — G5603 Carpal tunnel syndrome, bilateral upper limbs: Secondary | ICD-10-CM | POA: Diagnosis not present

## 2016-08-18 DIAGNOSIS — F17200 Nicotine dependence, unspecified, uncomplicated: Secondary | ICD-10-CM | POA: Diagnosis not present

## 2016-08-18 NOTE — Progress Notes (Signed)
Dr. Frederico Hamman T. Camelle Henkels, MD, [facility] [place], [address] Phone: [phone] Fax: [phone]  08/18/2016  Patient: Xavier Perry, MRN: [id number], DOB: 05-Sep-1945, 71 y.o.  Primary Physician:  Owens Loffler, MD   Chief Complaint  Patient presents with  . Annual Exam    Pt had his wellness on 8/9    Subjective:   Xavier Perry is a 71 y.o. very pleasant male patient who presents with the following:  Routine follow-up.   Three or four month ago, used a sander, then now both ands are very numb - R hand will feel like first 4 digits are numb. Often worst on the right hand.  Prior to, rarely numb.   Sometimes tingling in the R foot.   ? EMG / NCV.  Additionally he had some hip osteoarthritis, and he reports that he had a hip injection previous to this.  Past Medical History, Surgical History, Social History, Family History, Problem List, Medications, and Allergies have been reviewed and updated if relevant.  Patient Active Problem List   Diagnosis Date Noted  . Osteoarthritis of left knee 01/02/2015  . Varicose veins of leg with complications 09/73/5329  . Abdominal aneurysm without mention of rupture 09/19/2013  . Varicose veins of lower extremities with other complications 92/42/6834  . Personal history of colonic adenomas 08/30/2012  . GERD (gastroesophageal reflux disease) 09/19/2011  . History of repair of rotator cuff 09/19/2011  . Squamous cell carcinoma in situ of skin 09/19/2011    Past Medical History:  Diagnosis Date  . Basal cell carcinoma   . GERD (gastroesophageal reflux disease) 09/19/2011  . History of repair of rotator cuff 09/19/2011   L 2009 and 2010  . Personal history of colonic adenomas 08/30/2012  . Squamous cell carcinoma in situ of skin 09/19/2011    Past Surgical History:  Procedure Laterality Date  . BASAL CELL CARCINOMA EXCISION    . COLONOSCOPY  08-30-2012  . ROTATOR CUFF REPAIR  Left 2009 and 2010  . TONSILECTOMY, ADENOIDECTOMY, BILATERAL MYRINGOTOMY AND TUBES  1967    Social History   Social History  . Marital status: Married    Spouse name: N/A  . Number of children: N/A  . Years of education: N/A   Occupational History  . Not on file.   Social History Main Topics  . Smoking status: Current Every Day Smoker    Packs/day: 0.50    Types: Cigarettes  . Smokeless tobacco: Never Used     Comment: pt will discuss tobacco cessation with PCP  . Alcohol use 10.2 oz/week    14 Glasses of wine, 3 Standard drinks or equivalent per week     Comment: 2 glasses of wine daily  . Drug use: No  . Sexual activity: Not Currently   Other Topics Concern  . Not on file   Social History Narrative  . No narrative on file    Family History  Problem Relation Age of Onset  . Varicose Veins Mother   . Cancer Father   . Colon cancer Neg Hx   . Esophageal cancer Neg Hx   . Rectal cancer Neg Hx   . Stomach cancer Neg Hx     No Known Allergies  Medication list reviewed and updated in full in [place].   GEN: No acute illnesses, no fevers, chills. GI: No n/v/d, eating normally Pulm: No SOB Interactive and getting along well at home.  Otherwise, ROS is as per the HPI.  Objective:   BP 112/72 (BP Location: Left Arm, Patient Position: Sitting, Cuff Size: Normal)   Pulse 72   Temp 98.3 F (36.8 C) (Oral)   Ht 5\' 9"  (1.753 m)   Wt 205 lb 12.8 oz (93.4 kg)   SpO2 95%   BMI 30.39 kg/m   GEN: WDWN, NAD, Non-toxic, A & O x 3 HEENT: Atraumatic, Normocephalic.  Neck supple. No masses, No LAD. Ears and Nose: No external deformity. CV: RRR, No M/G/R. No JVD. No thrill. No extra heart sounds. PULM: CTA B, no wheezes, crackles, rhonchi. No retractions. No resp. distress. No accessory muscle use. EXTR: No c/c/e NEURO Normal gait.  PSYCH: Normally interactive. Conversant. Not depressed or anxious appearing.  Calm demeanor.   Laboratory and Imaging  Data: Results for orders placed or performed in visit on 08/11/16  Lipid panel  Result Value Ref Range   Cholesterol 200 0 - 200 mg/dL   Triglycerides 174.0 (H) 0.0 - 149.0 mg/dL   HDL 58.80 >39.00 mg/dL   VLDL 34.8 0.0 - 40.0 mg/dL   LDL Cholesterol 106 (H) 0 - 99 mg/dL   Total CHOL/HDL Ratio 3    NonHDL 140.94   Hepatic function panel  Result Value Ref Range   Total Bilirubin 0.7 0.2 - 1.2 mg/dL   Bilirubin, Direct 0.1 0.0 - 0.3 mg/dL   Alkaline Phosphatase 89 39 - 117 U/L   AST 21 0 - 37 U/L   ALT 23 0 - 53 U/L   Total Protein 6.9 6.0 - 8.3 g/dL   Albumin 4.3 3.5 - 5.2 g/dL  Basic metabolic panel  Result Value Ref Range   Sodium 137 135 - 145 mEq/L   Potassium 4.4 3.5 - 5.1 mEq/L   Chloride 102 96 - 112 mEq/L   CO2 31 19 - 32 mEq/L   Glucose, Bld 102 (H) 70 - 99 mg/dL   BUN 14 6 - 23 mg/dL   Creatinine, Ser 0.84 0.40 - 1.50 mg/dL   Calcium 9.4 8.4 - 10.5 mg/dL   GFR 96.01 >60.00 mL/min  CBC with Differential/Platelet  Result Value Ref Range   WBC 7.8 4.0 - 10.5 K/uL   RBC 4.64 4.22 - 5.81 Mil/uL   Hemoglobin 14.4 13.0 - 17.0 g/dL   HCT 43.1 39.0 - 52.0 %   MCV 92.9 78.0 - 100.0 fl   MCHC 33.5 30.0 - 36.0 g/dL   RDW 13.1 11.5 - 15.5 %   Platelets 242.0 150.0 - 400.0 K/uL   Neutrophils Relative % 59.2 43.0 - 77.0 %   Lymphocytes Relative 30.3 12.0 - 46.0 %   Monocytes Relative 6.8 3.0 - 12.0 %   Eosinophils Relative 3.0 0.0 - 5.0 %   Basophils Relative 0.7 0.0 - 3.0 %   Neutro Abs 4.6 1.4 - 7.7 K/uL   Lymphs Abs 2.4 0.7 - 4.0 K/uL   Monocytes Absolute 0.5 0.1 - 1.0 K/uL   Eosinophils Absolute 0.2 0.0 - 0.7 K/uL   Basophils Absolute 0.1 0.0 - 0.1 K/uL  Hepatitis C antibody  Result Value Ref Range   HCV Ab NON-REACTIVE NON-REACTIVE  PSA, Medicare  Result Value Ref Range   PSA 0.29 0.10 - 4.00 ng/ml     Assessment and Plan:   Carpal tunnel syndrome, bilateral  Prediabetes  Gastroesophageal reflux disease, esophagitis presence not specified  Smoker    He clearly has carpal tunnel syndrome bilaterally. I think that he probably needs EMGs  and nerve conduction studies. He is going to think about this and call me if he decides to do it.  Recommended discontinuing tobacco use.  Working out well.   Follow-up: No Follow-up on file.  Future Appointments Date Time Provider [place]  08/15/2017 11:15 AM Eustace Pen, LPN [facility]  08/31/2017 8:30 AM Hyland Mollenkopf, Frederico Hamman, MD [facility]    Signed,  Maud Deed. Hamdi Vari, MD   Allergies as of 08/18/2016   No Known Allergies     Medication List       Accurate as of 08/18/16  1:25 PM. Always use your most recent med list.          omeprazole 20 MG capsule Commonly known as:  PRILOSEC Take 1 capsule (20 mg total) by mouth daily.

## 2016-09-19 DIAGNOSIS — M1811 Unilateral primary osteoarthritis of first carpometacarpal joint, right hand: Secondary | ICD-10-CM | POA: Diagnosis not present

## 2016-09-19 DIAGNOSIS — G5603 Carpal tunnel syndrome, bilateral upper limbs: Secondary | ICD-10-CM | POA: Diagnosis not present

## 2016-10-12 DIAGNOSIS — G5603 Carpal tunnel syndrome, bilateral upper limbs: Secondary | ICD-10-CM | POA: Diagnosis not present

## 2016-11-11 DIAGNOSIS — G5603 Carpal tunnel syndrome, bilateral upper limbs: Secondary | ICD-10-CM | POA: Diagnosis not present

## 2016-11-12 ENCOUNTER — Other Ambulatory Visit: Payer: Self-pay

## 2016-11-12 ENCOUNTER — Emergency Department: Payer: Medicare Other

## 2016-11-12 ENCOUNTER — Encounter: Payer: Self-pay | Admitting: Emergency Medicine

## 2016-11-12 ENCOUNTER — Emergency Department
Admission: EM | Admit: 2016-11-12 | Discharge: 2016-11-12 | Disposition: A | Discharge status: Home / Self Care | Payer: Medicare Other | Attending: Emergency Medicine | Admitting: Emergency Medicine

## 2016-11-12 DIAGNOSIS — R42 Dizziness and giddiness: Secondary | ICD-10-CM | POA: Diagnosis not present

## 2016-11-12 DIAGNOSIS — F17210 Nicotine dependence, cigarettes, uncomplicated: Secondary | ICD-10-CM | POA: Insufficient documentation

## 2016-11-12 DIAGNOSIS — R9431 Abnormal electrocardiogram [ECG] [EKG]: Secondary | ICD-10-CM | POA: Diagnosis not present

## 2016-11-12 LAB — CBC
HCT: 45.6 % (ref 40.0–52.0)
HEMOGLOBIN: 15.3 g/dL (ref 13.0–18.0)
MCH: 30.6 pg (ref 26.0–34.0)
MCHC: 33.5 g/dL (ref 32.0–36.0)
MCV: 91.3 fL (ref 80.0–100.0)
PLATELETS: 221 10*3/uL (ref 150–440)
RBC: 4.99 MIL/uL (ref 4.40–5.90)
RDW: 12.8 % (ref 11.5–14.5)
WBC: 8.9 10*3/uL (ref 3.8–10.6)

## 2016-11-12 LAB — URINALYSIS, COMPLETE (UACMP) WITH MICROSCOPIC
BILIRUBIN URINE: NEGATIVE
Bacteria, UA: NONE SEEN
GLUCOSE, UA: NEGATIVE mg/dL
HGB URINE DIPSTICK: NEGATIVE
Ketones, ur: NEGATIVE mg/dL
Leukocytes, UA: NEGATIVE
NITRITE: NEGATIVE
Protein, ur: NEGATIVE mg/dL
RBC / HPF: NONE SEEN RBC/hpf (ref 0–5)
Specific Gravity, Urine: 1.003 — ABNORMAL LOW (ref 1.005–1.030)
Squamous Epithelial / LPF: NONE SEEN
pH: 6 (ref 5.0–8.0)

## 2016-11-12 LAB — TROPONIN I: Troponin I: <0.03 ng/mL (ref <0.03)

## 2016-11-12 LAB — BASIC METABOLIC PANEL
ANION GAP: 9 (ref 5–15)
BUN: 16 mg/dL (ref 6–20)
CALCIUM: 9.3 mg/dL (ref 8.9–10.3)
CO2: 23 mmol/L (ref 22–32)
CREATININE: 0.72 mg/dL (ref 0.61–1.24)
Chloride: 103 mmol/L (ref 101–111)
Glucose, Bld: 106 mg/dL — ABNORMAL HIGH (ref 65–99)
Potassium: 3.7 mmol/L (ref 3.5–5.1)
Sodium: 135 mmol/L (ref 135–145)

## 2016-11-12 LAB — GLUCOSE, CAPILLARY: Glucose-Capillary: 110 mg/dL — ABNORMAL HIGH (ref 65–99)

## 2016-11-12 MED ORDER — ASPIRIN 81 MG PO CHEW
324.0000 mg | CHEWABLE_TABLET | Freq: Once | ORAL | Status: AC
  Administered 2016-11-12: 324 mg via ORAL
  Filled 2016-11-12: qty 4

## 2016-11-12 MED ORDER — ASPIRIN 81 MG PO TABS: 81.0000 mg | ORAL_TABLET | Freq: Every day | ORAL | 0 refills | Status: AC

## 2016-11-12 NOTE — ED Notes (Signed)
Pt returned from CT °

## 2016-11-12 NOTE — ED Provider Notes (Signed)
[facility] Emergency Department Provider Note  ____________________________________________  Time seen: Approximately 4:03 PM  I have reviewed the triage vital signs and the nursing notes.   HISTORY  Chief Complaint Dizziness   HPI Xavier Perry is a 71 y.o. male no significant past medical history who presents for evaluation of vertigo. Patient reports that he was driving when all of a sudden he developed vertigo which lasted about an hour, he became diaphoretic and nauseated. At this time he reports that his symptoms have resolved. He was accompanied by his wife and was driving when this happened. No facial droop, no slurred speech, no diplopia, no dysarthria, no dysphagia, no unilateral weakness or numbness.Patient denies h/o vertigo. He reports some sinus congestion recently. No recent URI. No HA. Not on blood thinners. NO CP, palpitations, SOB, abdominal pain. Patient denies f/h stroke but his grandmother had a brain aneurysm.  Past Medical History:  Diagnosis Date  . Basal cell carcinoma   . GERD (gastroesophageal reflux disease) 09/19/2011  . History of repair of rotator cuff 09/19/2011   L 2009 and 2010  . Personal history of colonic adenomas 08/30/2012  . Squamous cell carcinoma in situ of skin 09/19/2011    Patient Active Problem List   Diagnosis Date Noted  . Smoker 08/18/2016  . Osteoarthritis of left knee 01/02/2015  . Varicose veins of leg with complications 74/25/9563  . Abdominal aneurysm without mention of rupture 09/19/2013  . Varicose veins of lower extremities with other complications 87/56/4332  . Personal history of colonic adenomas 08/30/2012  . GERD (gastroesophageal reflux disease) 09/19/2011  . History of repair of rotator cuff 09/19/2011  . Squamous cell carcinoma in situ of skin 09/19/2011    Past Surgical History:  Procedure Laterality Date  . BASAL CELL CARCINOMA EXCISION    . COLONOSCOPY  08-30-2012  . ROTATOR CUFF REPAIR  Left 2009 and 2010  . TONSILECTOMY, ADENOIDECTOMY, BILATERAL MYRINGOTOMY AND TUBES  1967    Prior to Admission medications   Medication Sig Start Date End Date Taking? Authorizing Provider  aspirin 81 MG tablet Take 1 tablet (81 mg total) daily by mouth. 11/12/16   Alfred Levins, [place], MD  omeprazole (PRILOSEC) 20 MG capsule Take 1 capsule (20 mg total) by mouth daily. Patient not taking: Reported on 08/18/2016 07/25/13   Owens Loffler, MD    Allergies Patient has no known allergies.  Family History  Problem Relation Age of Onset  . Varicose Veins Mother   . Cancer Father   . Colon cancer Neg Hx   . Esophageal cancer Neg Hx   . Rectal cancer Neg Hx   . Stomach cancer Neg Hx     Social History Social History   Tobacco Use  . Smoking status: Current Every Day Smoker    Packs/day: 0.50    Types: Cigarettes  . Smokeless tobacco: Never Used  . Tobacco comment: pt will discuss tobacco cessation with PCP  Substance Use Topics  . Alcohol use: Yes    Alcohol/week: 10.2 oz    Types: 14 Glasses of wine, 3 Standard drinks or equivalent per week    Comment: 2 glasses of wine daily  . Drug use: No    Review of Systems  Constitutional: Negative for fever. Eyes: Negative for visual changes. ENT: Negative for sore throat. Neck: No neck pain  Cardiovascular: Negative for chest pain. Respiratory: Negative for shortness of breath. Gastrointestinal: Negative for abdominal pain, vomiting or diarrhea. Genitourinary: Negative for dysuria. Musculoskeletal: Negative  for back pain. Skin: Negative for rash. Neurological: Negative for headaches, weakness or numbness. Psych: No SI or HI  ____________________________________________   PHYSICAL EXAM:  VITAL SIGNS: ED Triage Vitals  Enc Vitals Group     BP 11/12/16 1342 (!) 153/82     Pulse Rate 11/12/16 1342 (!) 57     Resp 11/12/16 1342 16     Temp 11/12/16 1342 98.1 F (36.7 C)     Temp Source 11/12/16 1342 Oral     SpO2  11/12/16 1342 98 %     Weight 11/12/16 1344 206 lb (93.4 kg)     Height 11/12/16 1344 5\' 10"  (1.778 m)     Head Circumference --      Peak Flow --      Pain Score --      Pain Loc --      Pain Edu? --      Excl. in [place]? --     Constitutional: Alert and oriented. Well appearing and in no apparent distress. HEENT:      Head: Normocephalic and atraumatic.         Eyes: Conjunctivae are normal. Sclera is non-icteric.       Mouth/Throat: Mucous membranes are moist.       Neck: Supple with no signs of meningismus. Cardiovascular: Regular rate and rhythm. No murmurs, gallops, or rubs. 2+ symmetrical distal pulses are present in all extremities. No JVD. Respiratory: Normal respiratory effort. Lungs are clear to auscultation bilaterally. No wheezes, crackles, or rhonchi.  Gastrointestinal: Soft, non tender, and non distended with positive bowel sounds. No rebound or guarding. Genitourinary: No CVA tenderness. Musculoskeletal: Nontender with normal range of motion in all extremities. No edema, cyanosis, or erythema of extremities. Neurologic: Normal speech and language. A & O x3, PERRL, EOMI, no nystagmus, CN II-XII intact, motor testing reveals good tone and bulk throughout. There is no evidence of pronator drift or dysmetria. Muscle strength is 5/5 throughout. Deep tendon reflexes are 2+ throughout. Sensory examination is intact. Gait is normal. Skin: Skin is warm, dry and intact. No rash noted. Psychiatric: Mood and affect are normal. Speech and behavior are normal.  ____________________________________________   LABS (all labs ordered are listed, but only abnormal results are displayed)  Labs Reviewed  BASIC METABOLIC PANEL - Abnormal; Notable for the following components:      Result Value   Glucose, Bld 106 (*)    All other components within normal limits  URINALYSIS, COMPLETE (UACMP) WITH MICROSCOPIC - Abnormal; Notable for the following components:   Color, Urine STRAW (*)     APPearance CLEAR (*)    Specific Gravity, Urine 1.003 (*)    All other components within normal limits  GLUCOSE, CAPILLARY - Abnormal; Notable for the following components:   Glucose-Capillary 110 (*)    All other components within normal limits  CBC  TROPONIN I  CBG MONITORING, ED   ____________________________________________  EKG  ED ECG REPORT I, Rudene Re, the attending physician, personally viewed and interpreted this ECG.  Normal sinus rhythm, normal intervals, normal axis, no STE or depressions, no evidence of HOCM, AV block, delta wave, ARVD, prolonged QTc, WPW, or Brugada.  ____________________________________________  RADIOLOGY  Head CT: No acute intracranial abnormality. Mild chronic microvascular disease.   MRI brain: 1. No acute intracranial abnormality. 2. Age appropriate cerebral atrophy with mild to moderate chronic small vessel ischemic disease. ____________________________________________   PROCEDURES  Procedure(s) performed: None Procedures Critical Care performed:  None ____________________________________________  INITIAL IMPRESSION / ASSESSMENT AND PLAN / ED COURSE   72 y.o. male no significant past medical history who presents for evaluation of an episode of vertigo lasting 1 hour associated with diaphoresis and nausea. Symptoms have now resolved. Patient is neuro intact. EKG with no evidence of dysrhythmias. Vitals are within normal limits. CBC, BMP, and troponin all negative. Presentation concerning for peripheral vertigo vs TIA however based on patient's age and maternal grandmother with a history of an aneurysm bleed will send patient for CT head.    _________________________ 8:32 PM on 11/12/2016 -----------------------------------------  CT head and MRI with no evidence of acute stroke. Explained to the patient that he could have had a TIA or an episode of benign vertigo. However since patient remains neuro intact with no further  episodes of vertigo here I believe it is safe to dc patient at this time. I am starting patient on daily ASA and recommended close f/u with PCP on Monday for re-check of BP and further TIA workup as patient wishes to go home. Discussed return precautions with patient and his wife and will dc home at this time   As part of my medical decision making, I reviewed the following data within the [place] notes reviewed and incorporated, Labs reviewed , EKG interpreted , Radiograph reviewed , Notes from prior ED visits and [place] Controlled Substance Database    Pertinent labs & imaging results that were available during my care of the patient were reviewed by me and considered in my medical decision making (see chart for details).    ____________________________________________   FINAL CLINICAL IMPRESSION(S) / ED DIAGNOSES  Final diagnoses:  Vertigo      NEW MEDICATIONS STARTED DURING THIS VISIT:  This SmartLink is deprecated. Use AVSMEDLIST instead to display the medication list for a patient.   Note:  This document was prepared using Dragon voice recognition software and may include unintentional dictation errors.    Rudene Re, MD 11/12/16 2037

## 2016-11-12 NOTE — ED Triage Notes (Signed)
Pt to ED c/o dizziness and nausea. Pt states that he was driving down the road today when suddenly he felt dizzy and clammy. Pt states that he got nauseated and started sweating. Pt states that this lasted for about 10 minutes. Pt states that he is still lightheaded. Pt in NAD at this time.

## 2016-11-12 NOTE — Discharge Instructions (Signed)
As I explained to you, your symptoms could have been from benign vertigo or a transient stroke. Your MRI and blood work is all within normal limits. I am recommending that you take one baby aspirin day and follow up with your doctor On Monday- Tuesday for further evaluation. Your blood pressure is elevated here and that also needs to be re-checked by your doctor. In the meantime return to the ER for any new episodes of dizziness, facial droop, slurred speech, weakness or numbness in one side of your body, or severe headache. Also return to the ER if you have chest pain or shortness of breath.

## 2016-11-12 NOTE — ED Notes (Signed)
Pt already spoke with MRI and completed questionaire via phone.

## 2016-11-14 ENCOUNTER — Ambulatory Visit (INDEPENDENT_AMBULATORY_CARE_PROVIDER_SITE_OTHER): Payer: Medicare Other | Admitting: Family Medicine

## 2016-11-14 ENCOUNTER — Other Ambulatory Visit: Payer: Self-pay

## 2016-11-14 ENCOUNTER — Encounter: Payer: Self-pay | Admitting: Family Medicine

## 2016-11-14 VITALS — BP 120/70 | HR 75 | Temp 98.1°F | Ht 69.0 in | Wt 213.5 lb

## 2016-11-14 DIAGNOSIS — G459 Transient cerebral ischemic attack, unspecified: Secondary | ICD-10-CM | POA: Diagnosis not present

## 2016-11-14 MED ORDER — HYDROCHLOROTHIAZIDE 12.5 MG PO TABS: 12.5000 mg | ORAL_TABLET | Freq: Every day | ORAL | 1 refills | Status: DC

## 2016-11-14 MED ORDER — ATORVASTATIN CALCIUM 20 MG PO TABS: 20.0000 mg | ORAL_TABLET | Freq: Every day | ORAL | 1 refills | Status: DC

## 2016-11-14 NOTE — Progress Notes (Signed)
Dr. Frederico Hamman T. Verlan Grotz, MD, [facility] [place], [address] Phone: [phone] Fax: [phone]  11/14/2016  Patient: Xavier [place], MRN: [id number], DOB: 06-May-1945, 71 y.o.  Primary Physician:  Owens Loffler, MD   Chief Complaint  Patient presents with  . Follow-up    ED visit-Vertigo vs TIA   Subjective:   Xavier Perry is a 71 y.o. very pleasant male patient who presents with the following:  TIA vs vertigo. ER f/u  MR brain and CT head, age related white matter changes.   [place] and then everything got really woozy. Pulled into the parking lot. Sat in the back of his SUV and fot a clammy sweat. Did not have any chest pain. Did not have any spinning around him.   Went directly to the hosptal.  BP got higher over the the time in the hospital.   No symptoms by the time got into the MRI machine.   Chol smok HTN  144/82  Past Medical History, Surgical History, Social History, Family History, Problem List, Medications, and Allergies have been reviewed and updated if relevant.  Patient Active Problem List   Diagnosis Date Noted  . Smoker 08/18/2016  . Osteoarthritis of left knee 01/02/2015  . Varicose veins of leg with complications 30/09/2328  . Abdominal aneurysm without mention of rupture 09/19/2013  . Varicose veins of lower extremities with other complications 07/62/2633  . Personal history of colonic adenomas 08/30/2012  . GERD (gastroesophageal reflux disease) 09/19/2011  . History of repair of rotator cuff 09/19/2011  . Squamous cell carcinoma in situ of skin 09/19/2011    Past Medical History:  Diagnosis Date  . Basal cell carcinoma   . GERD (gastroesophageal reflux disease) 09/19/2011  . History of repair of rotator cuff 09/19/2011   L 2009 and 2010  . Personal history of colonic adenomas 08/30/2012  . Squamous cell carcinoma in situ of skin 09/19/2011    Past Surgical  History:  Procedure Laterality Date  . BASAL CELL CARCINOMA EXCISION    . COLONOSCOPY  08-30-2012  . ROTATOR CUFF REPAIR Left 2009 and 2010  . TONSILECTOMY, ADENOIDECTOMY, BILATERAL MYRINGOTOMY AND TUBES  1967    Social History   Socioeconomic History  . Marital status: Married    Spouse name: Not on file  . Number of children: Not on file  . Years of education: Not on file  . Highest education level: Not on file  Social Needs  . Financial resource strain: Not on file  . Food insecurity - worry: Not on file  . Food insecurity - inability: Not on file  . Transportation needs - medical: Not on file  . Transportation needs - non-medical: Not on file  Occupational History  . Not on file  Tobacco Use  . Smoking status: Current Every Day Smoker    Packs/day: 0.50    Types: Cigarettes  . Smokeless tobacco: Never Used  . Tobacco comment: pt will discuss tobacco cessation with PCP  Substance and Sexual Activity  . Alcohol use: Yes    Alcohol/week: 10.2 oz    Types: 14 Glasses of wine, 3 Standard drinks or equivalent per week    Comment: 2 glasses of wine daily  . Drug use: No  . Sexual activity: Not Currently  Other Topics Concern  . Not on file  Social History Narrative  . Not on file    Family History  Problem Relation  Age of Onset  . Varicose Veins Mother   . Cancer Father   . Colon cancer Neg Hx   . Esophageal cancer Neg Hx   . Rectal cancer Neg Hx   . Stomach cancer Neg Hx     No Known Allergies  Medication list reviewed and updated in full in [facility].   GEN: No acute illnesses, no fevers, chills. GI: No n/v/d, eating normally Pulm: No SOB Interactive and getting along well at home.  Otherwise, ROS is as per the HPI.  Objective:   BP 120/70   Pulse 75   Temp 98.1 F (36.7 C) (Oral)   Ht 5\' 9"  (1.753 m)   Wt 213 lb 8 oz (96.8 kg)   BMI 31.53 kg/m   GEN: WDWN, NAD, Non-toxic, A & O x 3 HEENT: Atraumatic, Normocephalic. Neck supple. No  masses, No LAD. Ears and Nose: No external deformity. CV: RRR, No M/G/R. No JVD. No thrill. No extra heart sounds. PULM: CTA B, no wheezes, crackles, rhonchi. No retractions. No resp. distress. No accessory muscle use. EXTR: No c/c/e NEURO Normal gait.  PSYCH: Normally interactive. Conversant. Not depressed or anxious appearing.  Calm demeanor.   Neuro: CN 2-12 grossly intact. PERRLA. EOMI. Sensation intact throughout. Str 5/5 all extremities. DTR 2+. No clonus. A and o x 4. Romberg neg. Finger nose neg. Heel -shin neg.     Laboratory and Imaging Data: Results for orders placed or performed during the hospital encounter of 47/09/62  Basic metabolic panel  Result Value Ref Range   Sodium 135 135 - 145 mmol/L   Potassium 3.7 3.5 - 5.1 mmol/L   Chloride 103 101 - 111 mmol/L   CO2 23 22 - 32 mmol/L   Glucose, Bld 106 (H) 65 - 99 mg/dL   BUN 16 6 - 20 mg/dL   Creatinine, Ser 0.72 0.61 - 1.24 mg/dL   Calcium 9.3 8.9 - 10.3 mg/dL   GFR calc non Af Amer >60 >60 mL/min   GFR calc Af Amer >60 >60 mL/min   Anion gap 9 5 - 15  CBC  Result Value Ref Range   WBC 8.9 3.8 - 10.6 K/uL   RBC 4.99 4.40 - 5.90 MIL/uL   Hemoglobin 15.3 13.0 - 18.0 g/dL   HCT 45.6 40.0 - 52.0 %   MCV 91.3 80.0 - 100.0 fL   MCH 30.6 26.0 - 34.0 pg   MCHC 33.5 32.0 - 36.0 g/dL   RDW 12.8 11.5 - 14.5 %   Platelets 221 150 - 440 K/uL  Urinalysis, Complete w Microscopic  Result Value Ref Range   Color, Urine STRAW (A) YELLOW   APPearance CLEAR (A) CLEAR   Specific Gravity, Urine 1.003 (L) 1.005 - 1.030   pH 6.0 5.0 - 8.0   Glucose, UA NEGATIVE NEGATIVE mg/dL   Hgb urine dipstick NEGATIVE NEGATIVE   Bilirubin Urine NEGATIVE NEGATIVE   Ketones, ur NEGATIVE NEGATIVE mg/dL   Protein, ur NEGATIVE NEGATIVE mg/dL   Nitrite NEGATIVE NEGATIVE   Leukocytes, UA NEGATIVE NEGATIVE   RBC / HPF NONE SEEN 0 - 5 RBC/hpf   WBC, UA 0-5 0 - 5 WBC/hpf   Bacteria, UA NONE SEEN NONE SEEN   Squamous Epithelial / LPF NONE SEEN NONE  SEEN  Glucose, capillary  Result Value Ref Range   Glucose-Capillary 110 (H) 65 - 99 mg/dL  Troponin I  Result Value Ref Range   Troponin I <0.03 <0.03 ng/mL    Ct  Head Wo Contrast  Result Date: 11/12/2016 CLINICAL DATA:  Dizziness and nausea. EXAM: CT HEAD WITHOUT CONTRAST TECHNIQUE: Contiguous axial images were obtained from the base of the skull through the vertex without intravenous contrast. COMPARISON:  None. FINDINGS: Brain: No evidence of acute infarction, hemorrhage, hydrocephalus, extra-axial collection or mass lesion/mass effect. Mild brain parenchymal volume loss and periventricular microangiopathy. Vascular: Calcific atherosclerotic disease at the skullbase. Skull: Normal. Negative for fracture or focal lesion. Sinuses/Orbits: No acute finding. Other: None. IMPRESSION: No acute intracranial abnormality. Mild chronic microvascular disease. Electronically Signed   By: Fidela Salisbury M.D.   On: 11/12/2016 16:32   Mr Brain Wo Contrast  Result Date: 11/12/2016 CLINICAL DATA:  Initial evaluation for acute dizziness with nausea. EXAM: MRI HEAD WITHOUT CONTRAST TECHNIQUE: Multiplanar, multiecho pulse sequences of the brain and surrounding structures were obtained without intravenous contrast. COMPARISON:  Prior CT from earlier the same day. FINDINGS: Brain: Age-appropriate cerebral atrophy. Patchy and confluent T2/FLAIR hyperintensity within the periventricular and deep white matter both cerebral hemispheres, most like related chronic small vessel ischemic disease, mild to moderate for age. No abnormal foci of restricted diffusion to suggest acute or subacute ischemia. Gray-white matter differentiation maintained. No evidence for remote cortical infarction. No foci of susceptibility artifact to suggest acute or chronic intracranial hemorrhage. No mass lesion, midline shift or mass effect. No hydrocephalus. No extra-axial fluid collection. Major dural sinuses grossly patent. Pituitary gland  suprasellar region normal. Midline structures intact and normal. Vascular: Major intracranial vascular flow voids maintained. Skull and upper cervical spine: Craniocervical junction normal. Degenerative spondylolysis noted at C3-4 without significant stenosis. Remainder the visualized upper cervical spine otherwise unremarkable. Bone marrow signal intensity within normal limits. No scalp soft tissue abnormality. Sinuses/Orbits: Globes and orbital soft tissues within normal limits. Paranasal sinuses clear. Trace left mastoid effusion, of doubtful significance. Inner ear structures normal. Other: None. IMPRESSION: 1. No acute intracranial abnormality. 2. Age appropriate cerebral atrophy with mild to moderate chronic small vessel ischemic disease. Electronically Signed   By: Jeannine Boga M.D.   On: 11/12/2016 20:15     Assessment and Plan:   TIA (transient ischemic attack) - Plan: VAS US CAROTID, ECHOCARDIOGRAM COMPLETE  History c/w TIA. Reviewed with patient and wife. No CVA on MRI.  ASA 81 mg daily, add statin, better BP control.  Stop smoking ASAP.  He understands.   Follow-up: Return in about 3 months (around 02/14/2017).  Future Appointments  Date Time Provider [place]  11/23/2016  7:30 AM [facility] [place] [facility]  11/23/2016  8:30 AM [facility] [place] [facility]  02/15/2017  8:00 AM Sanyiah Kanzler, Frederico Hamman, MD [facility]  08/15/2017 11:15 AM Barnetta Hammersmith, Venetia Maxon, LPN [facility]  06/29/9483  8:30 AM Marrio Scribner, Frederico Hamman, MD [facility]    Meds ordered this encounter  Medications  . hydrochlorothiazide (HYDRODIURIL) 12.5 MG tablet    Sig: Take 1 tablet (12.5 mg total) daily by mouth.    Dispense:  90 tablet    Refill:  1  . atorvastatin (LIPITOR) 20 MG tablet    Sig: Take 1 tablet (20 mg total) daily by mouth.    Dispense:  90 tablet    Refill:  1   There are no discontinued medications. Orders Placed This Encounter  Procedures  . ECHOCARDIOGRAM  COMPLETE    Signed,  Frederico Hamman T. Leshia Kope, MD   Allergies as of 11/14/2016   No Known Allergies     Medication List  Accurate as of 11/14/16 11:59 PM. Always use your most recent med list.          aspirin 81 MG tablet Take 1 tablet (81 mg total) daily by mouth.   atorvastatin 20 MG tablet Commonly known as:  LIPITOR Take 1 tablet (20 mg total) daily by mouth.   hydrochlorothiazide 12.5 MG tablet Commonly known as:  HYDRODIURIL Take 1 tablet (12.5 mg total) daily by mouth.   omeprazole 20 MG capsule Commonly known as:  PRILOSEC Take 1 capsule (20 mg total) by mouth daily.

## 2016-11-14 NOTE — Patient Instructions (Signed)

## 2016-11-15 DIAGNOSIS — G459 Transient cerebral ischemic attack, unspecified: Secondary | ICD-10-CM | POA: Insufficient documentation

## 2016-11-23 ENCOUNTER — Other Ambulatory Visit: Payer: Self-pay

## 2016-11-23 ENCOUNTER — Ambulatory Visit (INDEPENDENT_AMBULATORY_CARE_PROVIDER_SITE_OTHER): Payer: Medicare Other

## 2016-11-23 DIAGNOSIS — G459 Transient cerebral ischemic attack, unspecified: Secondary | ICD-10-CM | POA: Diagnosis not present

## 2016-11-23 LAB — VAS US CAROTID
LCCADDIAS: -13 cm/s
LEFT ECA DIAS: -17 cm/s
LEFT VERTEBRAL DIAS: -10 cm/s
LICADSYS: -109 cm/s
Left CCA dist sys: -49 cm/s
Left CCA prox dias: 16 cm/s
Left CCA prox sys: 68 cm/s
Left ICA dist dias: -38 cm/s
Left ICA prox dias: -19 cm/s
Left ICA prox sys: -49 cm/s
RCCADSYS: -65 cm/s
RIGHT ECA DIAS: -8 cm/s
RIGHT VERTEBRAL DIAS: -11 cm/s
Right CCA prox dias: 9 cm/s
Right CCA prox sys: 41 cm/s

## 2016-12-05 ENCOUNTER — Telehealth: Payer: Self-pay | Admitting: *Deleted

## 2016-12-05 NOTE — Telephone Encounter (Signed)
Mr. Breeding is scheduled for carpal tunnel release on 12/13/2016.  Spoke with Mr. Bodkin and advised him to stop his aspirin today.  Needs to be off of it 7 days prior to surgery per Dr. Lorelei Pont.    Mr. Recore states understanding.  Pre-Op Clearance form and records faxed to [place] at [facility] at [phone].

## 2016-12-16 ENCOUNTER — Telehealth: Payer: Self-pay

## 2016-12-16 ENCOUNTER — Telehealth: Payer: Self-pay | Admitting: Internal Medicine

## 2016-12-16 NOTE — Telephone Encounter (Signed)
Copied from [place]. Topic: Inquiry >> Dec 16, 2016 11:28 AM Scherrie Gerlach wrote: Reason for CRM: Christy with [facility] states they have the clearance letter for pt and pt is scheduled in the morning for surgery. However anesthesia just called her, stating pt said he had a stroke or TIA 5 wks ago.  They need to know which was it? Stroke or TIA?  And if it was a stroke, what was the plan for treatment? Alyse Low says they need something in writing today and they close at 12 noon today. Fax  [phone]   Phone [phone]   >> Dec 16, 2016 11:45 AM Modena Nunnery, CMA wrote: Original clearance letter written by dr Lorelei Pont; advised they will not receive by 12 noon as that is 15 min from now >> Dec 16, 2016  2:49 PM Conception Chancy, NT wrote: Levada Dy is calling from [facility] in regards to this message -- she states patient is scheduled tomorrow and needs clarification on this. If you can not help her please contact her and let her know who she needs to contact for clarification. States if you call and she is on the phone to please leave a message.   Call back [phone] ext [phone]  Fax number [phone]

## 2016-12-16 NOTE — Telephone Encounter (Signed)
Copied from [place]. Topic: Inquiry >> Dec 16, 2016 11:28 AM Scherrie Gerlach wrote: Reason for CRM: Christy with [facility] states they have the clearance letter for pt and pt is scheduled in the morning for surgery. However anesthesia just called her, stating pt said he had a stroke or TIA 5 wks ago.  They need to know which was it? Stroke or TIA?  And if it was a stroke, what was the plan for treatment? Alyse Low says they need something in writing today and they close at 12 noon today. Fax  [phone]   Phone [phone]   >> Dec 16, 2016 11:45 AM Modena Nunnery, CMA wrote: Original clearance letter written by dr Lorelei Pont; advised they will not receive by 12 noon as that is 15 min from now >> Dec 16, 2016  2:49 PM Conception Chancy, NT wrote: Levada Dy is calling from [facility] in regards to this message -- she states patient is scheduled tomorrow and needs clarification on this. If you can not help her please contact her and let her know who she needs to contact for clarification. States if you call and she is on the phone to please leave a message.   Call back [phone] ext [phone]  Fax number [phone]

## 2016-12-16 NOTE — Telephone Encounter (Signed)
Not sure why this was sent to me. This is not my patient and I have never seen him.     Copied from [place]. Topic: Inquiry >> Dec 16, 2016 11:28 AM Scherrie Gerlach wrote: Reason for CRM: Christy with [facility] states they have the clearance letter for pt and pt is scheduled in the morning for surgery. However anesthesia just called her, stating pt said he had a stroke or TIA 5 wks ago.  They need to know which was it? Stroke or TIA?  And if it was a stroke, what was the plan for treatment? Alyse Low says they need something in writing today and they close at 12 noon today. Fax  [phone]   Phone [phone]   >> Dec 16, 2016 11:45 AM Modena Nunnery, CMA wrote: Original clearance letter written by dr copland; advised they will not receive by 12 noon as that is 15 min from now

## 2016-12-17 DIAGNOSIS — G5602 Carpal tunnel syndrome, left upper limb: Secondary | ICD-10-CM | POA: Diagnosis not present

## 2016-12-17 DIAGNOSIS — Z8673 Personal history of transient ischemic attack (TIA), and cerebral infarction without residual deficits: Secondary | ICD-10-CM | POA: Diagnosis not present

## 2016-12-17 DIAGNOSIS — I10 Essential (primary) hypertension: Secondary | ICD-10-CM | POA: Diagnosis not present

## 2016-12-17 DIAGNOSIS — G5603 Carpal tunnel syndrome, bilateral upper limbs: Secondary | ICD-10-CM | POA: Diagnosis not present

## 2016-12-17 DIAGNOSIS — K219 Gastro-esophageal reflux disease without esophagitis: Secondary | ICD-10-CM | POA: Diagnosis not present

## 2016-12-17 DIAGNOSIS — F17210 Nicotine dependence, cigarettes, uncomplicated: Secondary | ICD-10-CM | POA: Diagnosis not present

## 2016-12-17 DIAGNOSIS — G5601 Carpal tunnel syndrome, right upper limb: Secondary | ICD-10-CM | POA: Diagnosis not present

## 2017-01-10 DIAGNOSIS — L570 Actinic keratosis: Secondary | ICD-10-CM | POA: Diagnosis not present

## 2017-01-10 DIAGNOSIS — Z85828 Personal history of other malignant neoplasm of skin: Secondary | ICD-10-CM | POA: Diagnosis not present

## 2017-01-10 DIAGNOSIS — Z859 Personal history of malignant neoplasm, unspecified: Secondary | ICD-10-CM | POA: Diagnosis not present

## 2017-01-10 DIAGNOSIS — L578 Other skin changes due to chronic exposure to nonionizing radiation: Secondary | ICD-10-CM | POA: Diagnosis not present

## 2017-01-10 DIAGNOSIS — Z872 Personal history of diseases of the skin and subcutaneous tissue: Secondary | ICD-10-CM | POA: Diagnosis not present

## 2017-01-10 DIAGNOSIS — Z1283 Encounter for screening for malignant neoplasm of skin: Secondary | ICD-10-CM | POA: Diagnosis not present

## 2017-02-15 ENCOUNTER — Ambulatory Visit (INDEPENDENT_AMBULATORY_CARE_PROVIDER_SITE_OTHER): Payer: Medicare Other | Admitting: Family Medicine

## 2017-02-15 ENCOUNTER — Encounter: Payer: Self-pay | Admitting: Family Medicine

## 2017-02-15 ENCOUNTER — Other Ambulatory Visit: Payer: Self-pay

## 2017-02-15 VITALS — BP 138/70 | HR 68 | Temp 98.5°F | Ht 69.0 in | Wt 208.8 lb

## 2017-02-15 DIAGNOSIS — M1712 Unilateral primary osteoarthritis, left knee: Secondary | ICD-10-CM

## 2017-02-15 DIAGNOSIS — M5442 Lumbago with sciatica, left side: Secondary | ICD-10-CM

## 2017-02-15 DIAGNOSIS — M5441 Lumbago with sciatica, right side: Secondary | ICD-10-CM | POA: Diagnosis not present

## 2017-02-15 DIAGNOSIS — Z79899 Other long term (current) drug therapy: Secondary | ICD-10-CM | POA: Diagnosis not present

## 2017-02-15 DIAGNOSIS — G459 Transient cerebral ischemic attack, unspecified: Secondary | ICD-10-CM | POA: Diagnosis not present

## 2017-02-15 DIAGNOSIS — G8929 Other chronic pain: Secondary | ICD-10-CM | POA: Diagnosis not present

## 2017-02-15 DIAGNOSIS — E785 Hyperlipidemia, unspecified: Secondary | ICD-10-CM

## 2017-02-15 LAB — BASIC METABOLIC PANEL
BUN: 14 mg/dL (ref 6–23)
CALCIUM: 9.6 mg/dL (ref 8.4–10.5)
CO2: 31 meq/L (ref 19–32)
Chloride: 101 mEq/L (ref 96–112)
Creatinine, Ser: 0.82 mg/dL (ref 0.40–1.50)
GFR: 98.57 mL/min (ref >60.00)
GLUCOSE: 127 mg/dL — AB (ref 70–99)
Potassium: 4.1 mEq/L (ref 3.5–5.1)
Sodium: 138 mEq/L (ref 135–145)

## 2017-02-15 LAB — LIPID PANEL
CHOLESTEROL: 139 mg/dL (ref 0–200)
HDL: 54.6 mg/dL (ref >39.00)
LDL Cholesterol: 57 mg/dL (ref 0–99)
NONHDL: 84.14
Total CHOL/HDL Ratio: 3
Triglycerides: 134 mg/dL (ref 0.0–149.0)
VLDL: 26.8 mg/dL (ref 0.0–40.0)

## 2017-02-15 LAB — HEPATIC FUNCTION PANEL
ALK PHOS: 82 U/L (ref 39–117)
ALT: 29 U/L (ref 0–53)
AST: 25 U/L (ref 0–37)
Albumin: 4.2 g/dL (ref 3.5–5.2)
BILIRUBIN DIRECT: 0.1 mg/dL (ref 0.0–0.3)
Total Bilirubin: 0.6 mg/dL (ref 0.2–1.2)
Total Protein: 7.3 g/dL (ref 6.0–8.3)

## 2017-02-15 MED ORDER — ATORVASTATIN CALCIUM 20 MG PO TABS: 20.0000 mg | ORAL_TABLET | Freq: Every day | ORAL | 3 refills | Status: DC

## 2017-02-15 MED ORDER — HYDROCHLOROTHIAZIDE 12.5 MG PO TABS: 12.5000 mg | ORAL_TABLET | Freq: Every day | ORAL | 3 refills | Status: DC

## 2017-02-15 NOTE — Progress Notes (Signed)
Dr. Frederico Hamman T. Odin Mariani, MD, [facility] [place], [address] Phone: [phone] Fax: [phone]  02/15/2017  Patient: Xavier Perry, MRN: [id number], DOB: December 08, 1945, 72 y.o.  Primary Physician:  Owens Loffler, MD   Chief Complaint  Patient presents with  . Follow-up    3  [facility] TIA   Subjective:   Xavier Perry is a 72 y.o. very pleasant male patient who presents with the following:  F/u, TIA, BP, Chol: Patient was here initially for TIA follow-up for blood work as well as follow-up blood pressure after his TIA.  He is doing well, he feels well, is working out, he is back playing golf, not really having any difficulties or feeling otherwise bad.  He also has some complaints about his back which is been an ongoing problem for some time, and he does have some pains with are going down into his legs and the back aspect, but this is rarely a rare occurrence and only happens about once or twice a month.  Not having any bowel or bladder incontinence, is not having any numbness.  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  He also still having some aches and pains in his left knee.  Some pain with terminal flexion.  He also has some pain on the joint lines.  Back is starting to hurt and had some pain in back of legs - only once or twice a month.  He has had back pain off and on for years, but is generally not been limited by, he still able to play golf and live a very active lifestyle.  L knee  Past Medical History, Surgical History, Social History, Family History, Problem List, Medications, and Allergies have been reviewed and updated if relevant.  Patient Active Problem List   Diagnosis Date Noted  . TIA (transient ischemic attack) 11/15/2016  . Smoker 08/18/2016  . Osteoarthritis of left knee 01/02/2015  . Varicose veins of leg with complications 78/24/2353  .  Abdominal aneurysm without mention of rupture 09/19/2013  . Varicose veins of lower extremities with other complications 61/44/3154  . Personal history of colonic adenomas 08/30/2012  . GERD (gastroesophageal reflux disease) 09/19/2011  . History of repair of rotator cuff 09/19/2011  . Squamous cell carcinoma in situ of skin 09/19/2011    Past Medical History:  Diagnosis Date  . Basal cell carcinoma   . GERD (gastroesophageal reflux disease) 09/19/2011  . History of repair of rotator cuff 09/19/2011   L 2009 and 2010  . Personal history of colonic adenomas 08/30/2012  . Squamous cell carcinoma in situ of skin 09/19/2011    Past Surgical History:  Procedure Laterality Date  . BASAL CELL CARCINOMA EXCISION    . COLONOSCOPY  08-30-2012  . ROTATOR CUFF REPAIR Left 2009 and 2010  . TONSILECTOMY, ADENOIDECTOMY, BILATERAL MYRINGOTOMY AND TUBES  1967    Social History   Socioeconomic History  . Marital status: Married    Spouse name: Not on file  . Number of children: Not on file  . Years of education: Not on file  . Highest education level: Not on file  Social Needs  . Financial resource strain: Not on file  . Food insecurity - worry: Not on file  . Food insecurity - inability: Not on file  . Transportation needs - medical: Not on file  . Transportation needs -  non-medical: Not on file  Occupational History  . Not on file  Tobacco Use  . Smoking status: Current Every Day Smoker    Packs/day: 0.50    Types: Cigarettes  . Smokeless tobacco: Never Used  . Tobacco comment: pt will discuss tobacco cessation with PCP  Substance and Sexual Activity  . Alcohol use: Yes    Alcohol/week: 10.2 oz    Types: 14 Glasses of wine, 3 Standard drinks or equivalent per week    Comment: 2 glasses of wine daily  . Drug use: No  . Sexual activity: Not Currently  Other Topics Concern  . Not on file  Social History Narrative  . Not on file    Family History  Problem Relation Age of Onset    . Varicose Veins Mother   . Cancer Father   . Colon cancer Neg Hx   . Esophageal cancer Neg Hx   . Rectal cancer Neg Hx   . Stomach cancer Neg Hx     No Known Allergies  Medication list reviewed and updated in full in [place].   GEN: No acute illnesses, no fevers, chills. GI: No n/v/d, eating normally Pulm: No SOB Interactive and getting along well at home.  Otherwise, ROS is as per the HPI.  Objective:   BP 138/70   Pulse 68   Temp 98.5 F (36.9 C) (Oral)   Ht 5\' 9"  (1.753 m)   Wt 208 lb 12 oz (94.7 kg)   BMI 30.83 kg/m   GEN: WDWN, NAD, Non-toxic, A & O x 3 HEENT: Atraumatic, Normocephalic. Neck supple. No masses, No LAD. Ears and Nose: No external deformity. CV: RRR, No M/G/R. No JVD. No thrill. No extra heart sounds. PULM: CTA B, no wheezes, crackles, rhonchi. No retractions. No resp. distress. No accessory muscle use. EXTR: No c/c/e NEURO Normal gait.  PSYCH: Normally interactive. Conversant. Not depressed or anxious appearing.  Calm demeanor.   Knee:  L Gait: Normal heel toe pattern ROM: 0-125 Effusion: neg Echymosis or edema: none Patellar tendon NT Painful PLICA: neg Patellar grind: negative Medial and lateral patellar facet loading: negative medial and lateral joint lines: mild joint line pain Mcmurray's neg Flexion-pinch neg Varus and valgus stress: stable Lachman: neg Ant and Post drawer: neg Hip abduction, IR, ER: WNL Hip flexion str: 5/5 Hip abd: 5/5 Quad: 5/5 VMO atrophy:No Hamstring concentric and eccentric: 5/5   SLR neg neurovasc intact LE  Laboratory and Imaging Data: Results for orders placed or performed in visit on 02/15/17  Lipid panel  Result Value Ref Range   Cholesterol 139 0 - 200 mg/dL   Triglycerides 134.0 0.0 - 149.0 mg/dL   HDL 54.60 >39.00 mg/dL   VLDL 26.8 0.0 - 40.0 mg/dL   LDL Cholesterol 57 0 - 99 mg/dL   Total CHOL/HDL Ratio 3    NonHDL 84.14   Hepatic function panel  Result Value Ref Range    Total Bilirubin 0.6 0.2 - 1.2 mg/dL   Bilirubin, Direct 0.1 0.0 - 0.3 mg/dL   Alkaline Phosphatase 82 39 - 117 U/L   AST 25 0 - 37 U/L   ALT 29 0 - 53 U/L   Total Protein 7.3 6.0 - 8.3 g/dL   Albumin 4.2 3.5 - 5.2 g/dL  Basic metabolic panel  Result Value Ref Range   Sodium 138 135 - 145 mEq/L   Potassium 4.1 3.5 - 5.1 mEq/L   Chloride 101 96 - 112 mEq/L   CO2  31 19 - 32 mEq/L   Glucose, Bld 127 (H) 70 - 99 mg/dL   BUN 14 6 - 23 mg/dL   Creatinine, Ser 0.82 0.40 - 1.50 mg/dL   Calcium 9.6 8.4 - 10.5 mg/dL   GFR 98.57 >60.00 mL/min     Assessment and Plan:   TIA (transient ischemic attack)  Hyperlipidemia LDL goal <70 - Plan: Lipid panel  Encounter for long-term (current) use of medications - Plan: Hepatic function panel, Basic metabolic panel  Primary osteoarthritis of left knee  Chronic bilateral low back pain with bilateral sciatica  >25 minutes spent in face to face time with patient, >50% spent in counselling or coordination of care   Globally he is doing well.  His cholesterol and blood pressure doing better.  All labs are stable.  Try to reassure him about his knee, think he just has some arthritis, which is functionally normal at age 72.  Continue to try to work out and keep his weight down with a mixture of cardio and weights.  Back pain with rare sciatica.  Try to reassure him, but this is only having 1 time per month, really would not pursue much aggressive evaluation or intervention.  Best thing he can do is keep working on his weight and work on his core support.  Follow-up: Return in about 9 months (around 11/15/2017) for medicare wellness.  Meds ordered this encounter  Medications  . atorvastatin (LIPITOR) 20 MG tablet    Sig: Take 1 tablet (20 mg total) by mouth daily.    Dispense:  90 tablet    Refill:  3  . hydrochlorothiazide (HYDRODIURIL) 12.5 MG tablet    Sig: Take 1 tablet (12.5 mg total) by mouth daily.    Dispense:  90 tablet    Refill:  3     Medications Discontinued During This Encounter  Medication Reason  . atorvastatin (LIPITOR) 20 MG tablet Reorder  . hydrochlorothiazide (HYDRODIURIL) 12.5 MG tablet Reorder   Orders Placed This Encounter  Procedures  . Lipid panel  . Hepatic function panel  . Basic metabolic panel    Signed,  Frederico Hamman T. Izael Bessinger, MD   Allergies as of 02/15/2017   No Known Allergies     Medication List        Accurate as of 02/15/17 11:59 PM. Always use your most recent med list.          aspirin 81 MG tablet Take 1 tablet (81 mg total) daily by mouth.   atorvastatin 20 MG tablet Commonly known as:  LIPITOR Take 1 tablet (20 mg total) by mouth daily.   hydrochlorothiazide 12.5 MG tablet Commonly known as:  HYDRODIURIL Take 1 tablet (12.5 mg total) by mouth daily.   omeprazole 20 MG capsule Commonly known as:  PRILOSEC Take 1 capsule (20 mg total) by mouth daily.

## 2017-02-16 ENCOUNTER — Encounter: Payer: Self-pay | Admitting: Family Medicine

## 2017-02-16 DIAGNOSIS — G8929 Other chronic pain: Secondary | ICD-10-CM

## 2017-02-16 DIAGNOSIS — M5442 Lumbago with sciatica, left side: Secondary | ICD-10-CM

## 2017-02-16 DIAGNOSIS — M5441 Lumbago with sciatica, right side: Secondary | ICD-10-CM

## 2017-02-16 HISTORY — DX: Other chronic pain: G89.29

## 2017-02-27 DIAGNOSIS — J029 Acute pharyngitis, unspecified: Secondary | ICD-10-CM | POA: Diagnosis not present

## 2017-02-27 DIAGNOSIS — J019 Acute sinusitis, unspecified: Secondary | ICD-10-CM | POA: Diagnosis not present

## 2017-06-17 IMAGING — DX DG KNEE AP/LAT W/ SUNRISE*L*
4 series · 4 of 4 positions shown · non-contrast
Comparison: 01/12/2012

CLINICAL DATA: Left knee pain for 3 months

EXAM:
LEFT KNEE 3 VIEWS

[knee ap (1 of 2)]
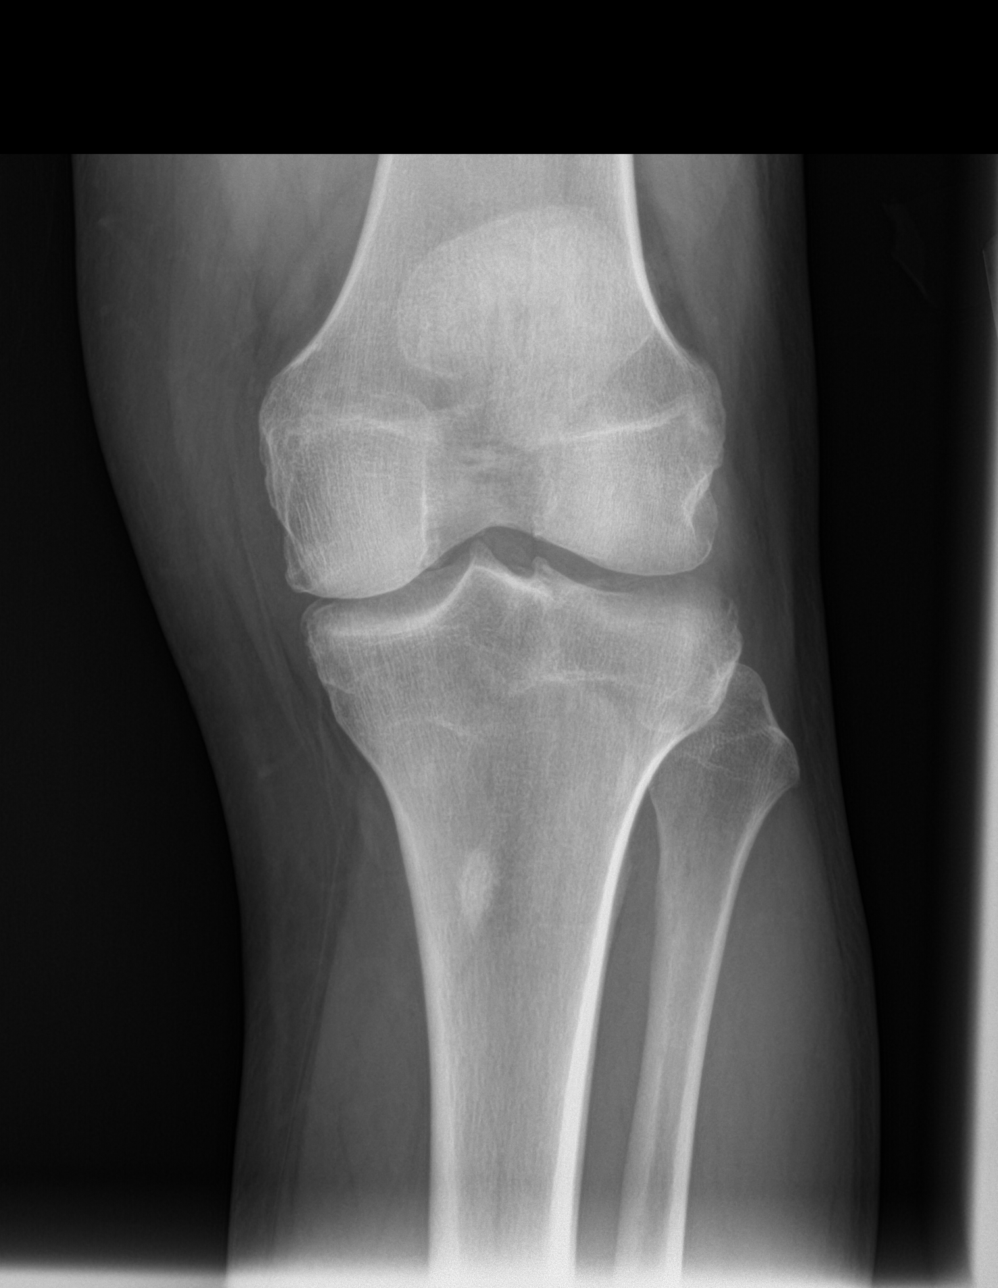

[knee lat]
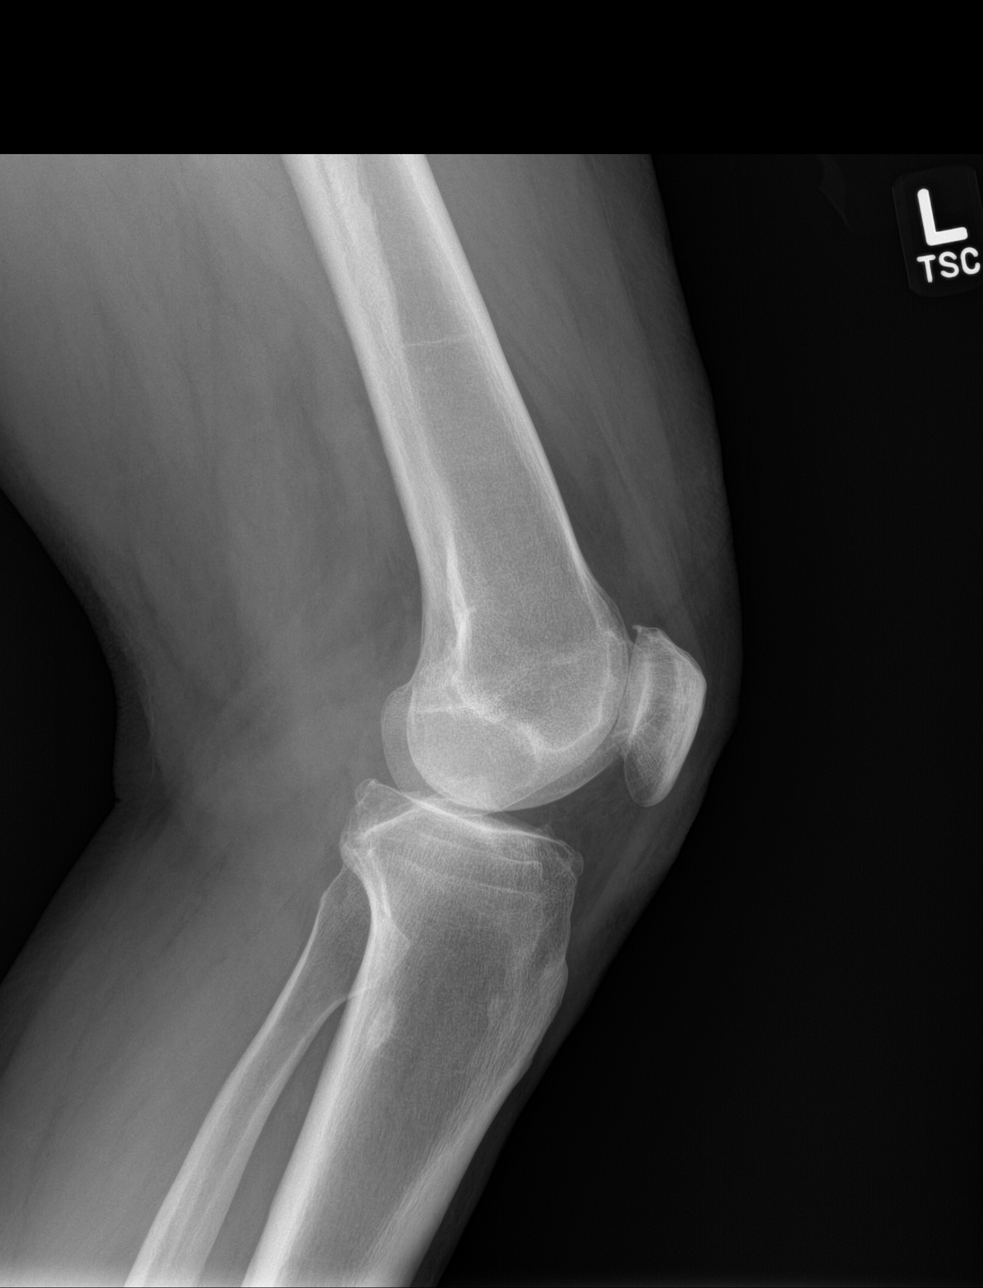

[patella skyline]
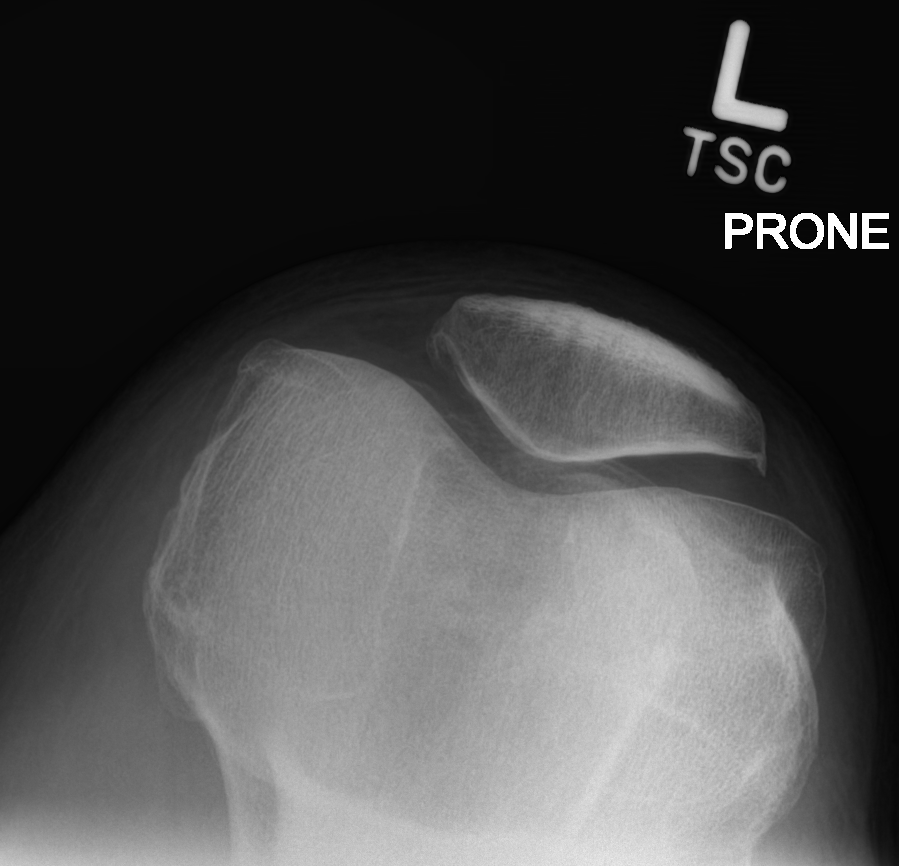

[knee ap (2 of 2)]
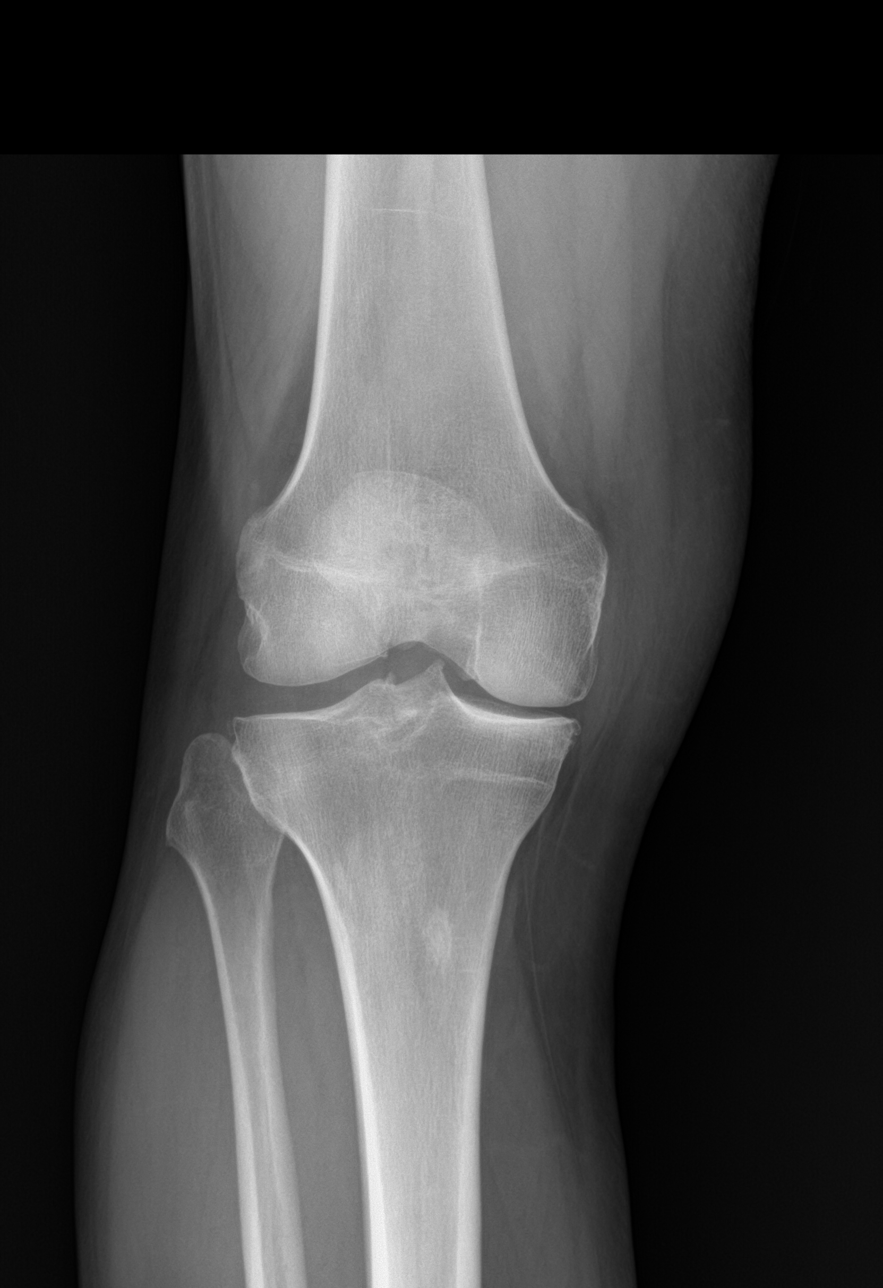

[4 of 4 positions shown; findings below may reference images not displayed]

FINDINGS: Try compartment joint space narrowing and sharpening the tibial
spines noted. No joint effusion. No fracture or subluxation.
IMPRESSION: 1. Osteoarthritis.
2. No acute findings.

## 2017-07-31 DIAGNOSIS — M1611 Unilateral primary osteoarthritis, right hip: Secondary | ICD-10-CM | POA: Diagnosis not present

## 2017-08-03 DIAGNOSIS — M1611 Unilateral primary osteoarthritis, right hip: Secondary | ICD-10-CM | POA: Diagnosis not present

## 2017-08-15 ENCOUNTER — Ambulatory Visit: Payer: Medicare Other

## 2017-08-15 ENCOUNTER — Ambulatory Visit (INDEPENDENT_AMBULATORY_CARE_PROVIDER_SITE_OTHER): Payer: Medicare Other

## 2017-08-15 VITALS — BP 118/80 | HR 68 | Temp 98.6°F | Ht 69.0 in | Wt 203.5 lb

## 2017-08-15 DIAGNOSIS — R739 Hyperglycemia, unspecified: Secondary | ICD-10-CM | POA: Diagnosis not present

## 2017-08-15 DIAGNOSIS — Z125 Encounter for screening for malignant neoplasm of prostate: Secondary | ICD-10-CM | POA: Diagnosis not present

## 2017-08-15 DIAGNOSIS — E782 Mixed hyperlipidemia: Secondary | ICD-10-CM | POA: Diagnosis not present

## 2017-08-15 DIAGNOSIS — Z0000 Encounter for general adult medical examination without abnormal findings: Secondary | ICD-10-CM | POA: Diagnosis not present

## 2017-08-15 LAB — HEPATIC FUNCTION PANEL
ALBUMIN: 4.3 g/dL (ref 3.5–5.2)
ALT: 25 U/L (ref 0–53)
AST: 21 U/L (ref 0–37)
Alkaline Phosphatase: 80 U/L (ref 39–117)
Bilirubin, Direct: 0.2 mg/dL (ref 0.0–0.3)
Total Bilirubin: 0.9 mg/dL (ref 0.2–1.2)
Total Protein: 7.1 g/dL (ref 6.0–8.3)

## 2017-08-15 LAB — CBC WITH DIFFERENTIAL/PLATELET
Basophils Absolute: 0.1 10*3/uL (ref 0.0–0.1)
Basophils Relative: 0.7 % (ref 0.0–3.0)
EOS PCT: 1.4 % (ref 0.0–5.0)
Eosinophils Absolute: 0.1 10*3/uL (ref 0.0–0.7)
HCT: 43.2 % (ref 39.0–52.0)
Hemoglobin: 14.8 g/dL (ref 13.0–17.0)
LYMPHS ABS: 2.3 10*3/uL (ref 0.7–4.0)
Lymphocytes Relative: 21.9 % (ref 12.0–46.0)
MCHC: 34.3 g/dL (ref 30.0–36.0)
MCV: 91.7 fl (ref 78.0–100.0)
MONO ABS: 0.6 10*3/uL (ref 0.1–1.0)
MONOS PCT: 5.5 % (ref 3.0–12.0)
NEUTROS ABS: 7.3 10*3/uL (ref 1.4–7.7)
NEUTROS PCT: 70.5 % (ref 43.0–77.0)
PLATELETS: 245 10*3/uL (ref 150.0–400.0)
RBC: 4.71 Mil/uL (ref 4.22–5.81)
RDW: 13.2 % (ref 11.5–15.5)
WBC: 10.4 10*3/uL (ref 4.0–10.5)

## 2017-08-15 LAB — BASIC METABOLIC PANEL
BUN: 18 mg/dL (ref 6–23)
CALCIUM: 9.8 mg/dL (ref 8.4–10.5)
CHLORIDE: 101 meq/L (ref 96–112)
CO2: 31 meq/L (ref 19–32)
Creatinine, Ser: 1 mg/dL (ref 0.40–1.50)
GFR: 78.29 mL/min (ref >60.00)
Glucose, Bld: 99 mg/dL (ref 70–99)
Potassium: 4.6 mEq/L (ref 3.5–5.1)
SODIUM: 139 meq/L (ref 135–145)

## 2017-08-15 LAB — HEMOGLOBIN A1C: HEMOGLOBIN A1C: 6.4 % (ref 4.6–6.5)

## 2017-08-15 LAB — LIPID PANEL
Cholesterol: 172 mg/dL (ref 0–200)
HDL: 73.6 mg/dL (ref >39.00)
LDL Cholesterol: 79 mg/dL (ref 0–99)
NonHDL: 98.87
TRIGLYCERIDES: 99 mg/dL (ref 0.0–149.0)
Total CHOL/HDL Ratio: 2
VLDL: 19.8 mg/dL (ref 0.0–40.0)

## 2017-08-15 LAB — PSA, MEDICARE: PSA: 0.23 ng/ml (ref 0.10–4.00)

## 2017-08-15 NOTE — Progress Notes (Signed)
I reviewed health advisor's note, was available for consultation, and agree with documentation and plan.   Signed,  Genecis Veley T. Nayla Dias, MD  

## 2017-08-15 NOTE — Patient Instructions (Signed)
Xavier Perry , Thank you for taking time to come for your Medicare Wellness Visit. I appreciate your ongoing commitment to your health goals. Please review the following plan we discussed and let me know if I can assist you in the future.   These are the goals we discussed: Goals    . Increase physical activity     Starting 08/15/2017, I will continue to golf for at least 5 hours 3-4 days per week and do yard work as needed.        This is a list of the screening recommended for you and due dates:  Health Maintenance  Topic Date Due  . Flu Shot  04/03/2018*  . Tetanus Vaccine  08/12/2026*  . Colon Cancer Screening  11/05/2017  .  Hepatitis C: One time screening is recommended by Center for Disease Control  (CDC) for  adults born from [birth year] through 1965.   Completed  . Pneumonia vaccines  Completed  *Topic was postponed. The date shown is not the original due date.   Preventive Care for Adults  A healthy lifestyle and preventive care can promote health and wellness. Preventive health guidelines for adults include the following key practices.  . A routine yearly physical is a good way to check with your health care provider about your health and preventive screening. It is a chance to share any concerns and updates on your health and to receive a thorough exam.  . Visit your dentist for a routine exam and preventive care every 6 months. Brush your teeth twice a day and floss once a day. Good oral hygiene prevents tooth decay and gum disease.  . The frequency of eye exams is based on your age, health, family medical history, use  of contact lenses, and other factors. Follow your health care provider's recommendations for frequency of eye exams.  . Eat a healthy diet. Foods like vegetables, fruits, whole grains, low-fat dairy products, and lean protein foods contain the nutrients you need without too many calories. Decrease your intake of foods high in solid fats, added sugars, and salt. Eat  the right amount of calories for you. Get information about a proper diet from your health care provider, if necessary.  . Regular physical exercise is one of the most important things you can do for your health. Most adults should get at least 150 minutes of moderate-intensity exercise (any activity that increases your heart rate and causes you to sweat) each week. In addition, most adults need muscle-strengthening exercises on 2 or more days a week.  Silver Sneakers may be a benefit available to you. To determine eligibility, you may visit the website: www.silversneakers.com or contact program at [phone]) 752-7576 Mon-Fri between 8AM-8PM.   . Maintain a healthy weight. The body mass index (BMI) is a screening tool to identify possible weight problems. It provides an estimate of body fat based on height and weight. Your health care provider can find your BMI and can help you achieve or maintain a healthy weight.   For adults 20 years and older: ? A BMI below 18.5 is considered underweight. ? A BMI of 18.5 to 24.9 is normal. ? A BMI of 25 to 29.9 is considered overweight. ? A BMI of 30 and above is considered obese.   . Maintain normal blood lipids and cholesterol levels by exercising and minimizing your intake of saturated fat. Eat a balanced diet with plenty of fruit and vegetables. Blood tests for lipids and cholesterol should begin at age  20 and be repeated every 5 years. If your lipid or cholesterol levels are high, you are over 50, or you are at high risk for heart disease, you may need your cholesterol levels checked more frequently. Ongoing high lipid and cholesterol levels should be treated with medicines if diet and exercise are not working.  . If you smoke, find out from your health care provider how to quit. If you do not use tobacco, please do not start.  . If you choose to drink alcohol, please do not consume more than 2 drinks per day. One drink is considered to be 12 ounces (355 mL)  of beer, 5 ounces (148 mL) of wine, or 1.5 ounces (44 mL) of liquor.  . If you are 84-85 years old, ask your health care provider if you should take aspirin to prevent strokes.  . Use sunscreen. Apply sunscreen liberally and repeatedly throughout the day. You should seek shade when your shadow is shorter than you. Protect yourself by wearing long sleeves, pants, a wide-brimmed hat, and sunglasses year round, whenever you are outdoors.  . Once a month, do a whole body skin exam, using a mirror to look at the skin on your back. Tell your health care provider of new moles, moles that have irregular borders, moles that are larger than a pencil eraser, or moles that have changed in shape or color.

## 2017-08-15 NOTE — Progress Notes (Signed)
Subjective:   Xavier Perry is a 72 y.o. male who presents for Medicare Annual/Subsequent preventive examination.  Review of Systems:  N/A Cardiac Risk Factors include: advanced age (>36men, >76 women);dyslipidemia;male gender;obesity (BMI >30kg/m2)     Objective:    Vitals: BP 118/80 (BP Location: Right Arm, Patient Position: Sitting, Cuff Size: Normal)   Pulse 68   Temp 98.6 F (37 C) (Oral)   Ht 5\' 9"  (1.753 m) Comment: no shoes  Wt 203 lb 8 oz (92.3 kg)   SpO2 95%   BMI 30.05 kg/m   Body mass index is 30.05 kg/m.  Advanced Directives 08/15/2017 11/12/2016 08/11/2016 10/23/2014 10/07/2014 09/19/2013  Does Patient Have a Medical Advance Directive? Yes No Yes Yes No No  Type of Paramedic of [place];Living will - [place];Living will [place];Living will - -  Copy of [place] in Chart? No - copy requested - No - copy requested - - -  Would patient like information on creating a medical advance directive? - No - Patient declined - - No - patient declined information No - patient declined information    Tobacco Social History   Tobacco Use  Smoking Status Current Every Day Smoker  . Packs/day: 0.50  . Types: Cigarettes  Smokeless Tobacco Never Used  Tobacco Comment   pt will discuss tobacco cessation with PCP     Ready to quit: No Counseling given: No Comment: pt will discuss tobacco cessation with PCP   Clinical Intake:  Pre-visit preparation completed: Yes  Pain Score: 3      Nutritional Status: BMI > 30  Obese Nutritional Risks: None Diabetes: No  How often do you need to have someone help you when you read instructions, pamphlets, or other written materials from your doctor or pharmacy?: 1 - Never What is the last grade level you completed in school?: Bachelors degree  Interpreter Needed?: No  Comments: pt lives with spouse Information entered by :: LPinson, LPN  Past  Medical History:  Diagnosis Date  . Basal cell carcinoma   . Chronic bilateral low back pain with bilateral sciatica 02/16/2017  . GERD (gastroesophageal reflux disease) 09/19/2011  . History of repair of rotator cuff 09/19/2011   L 2009 and 2010  . Personal history of colonic adenomas 08/30/2012  . Squamous cell carcinoma in situ of skin 09/19/2011   Past Surgical History:  Procedure Laterality Date  . BASAL CELL CARCINOMA EXCISION    . COLONOSCOPY  08-30-2012  . ROTATOR CUFF REPAIR Left 2009 and 2010  . TONSILECTOMY, ADENOIDECTOMY, BILATERAL MYRINGOTOMY AND TUBES  1967   Family History  Problem Relation Age of Onset  . Varicose Veins Mother   . Cancer Father   . Colon cancer Neg Hx   . Esophageal cancer Neg Hx   . Rectal cancer Neg Hx   . Stomach cancer Neg Hx    Social History   Socioeconomic History  . Marital status: Married    Spouse name: Not on file  . Number of children: Not on file  . Years of education: Not on file  . Highest education level: Not on file  Occupational History  . Not on file  Social Needs  . Financial resource strain: Not on file  . Food insecurity:    Worry: Not on file    Inability: Not on file  . Transportation needs:    Medical: Not on file    Non-medical: Not on  file  Tobacco Use  . Smoking status: Current Every Day Smoker    Packs/day: 0.50    Types: Cigarettes  . Smokeless tobacco: Never Used  . Tobacco comment: pt will discuss tobacco cessation with PCP  Substance and Sexual Activity  . Alcohol use: Yes    Alcohol/week: 17.0 standard drinks    Types: 14 Glasses of wine, 3 Standard drinks or equivalent per week    Comment: 2 glasses of wine daily  . Drug use: No  . Sexual activity: Not Currently  Lifestyle  . Physical activity:    Days per week: Not on file    Minutes per session: Not on file  . Stress: Not on file  Relationships  . Social connections:    Talks on phone: Not on file    Gets together: Not on file    Attends  religious service: Not on file    Active member of club or organization: Not on file    Attends meetings of clubs or organizations: Not on file    Relationship status: Not on file  Other Topics Concern  . Not on file  Social History Narrative  . Not on file    Outpatient Encounter Medications as of 08/15/2017  Medication Sig  . aspirin 81 MG tablet Take 1 tablet (81 mg total) daily by mouth.  Marland Kitchen atorvastatin (LIPITOR) 20 MG tablet Take 1 tablet (20 mg total) by mouth daily.  . hydrochlorothiazide (HYDRODIURIL) 12.5 MG tablet Take 1 tablet (12.5 mg total) by mouth daily.  Marland Kitchen omeprazole (PRILOSEC) 20 MG capsule Take 1 capsule (20 mg total) by mouth daily. (Patient taking differently: Take 20 mg by mouth as needed. )   No facility-administered encounter medications on file as of 08/15/2017.     Activities of Daily Living In your present state of health, do you have any difficulty performing the following activities: 08/15/2017  Hearing? Y  Vision? N  Difficulty concentrating or making decisions? N  Walking or climbing stairs? N  Dressing or bathing? N  Doing errands, shopping? N  Preparing Food and eating ? N  Using the Toilet? N  In the past six months, have you accidently leaked urine? N  Do you have problems with loss of bowel control? N  Managing your Medications? N  Managing your Finances? N  Housekeeping or managing your Housekeeping? N  Some recent data might be hidden    Patient Care Team: Owens Loffler, MD as PCP - General (Family Medicine)   Assessment:   This is a routine wellness examination for Xavier Perry.  Hearing Screening Comments: Hearing aids Vision Screening Comments: Vision exam in 2018 @ [facility] and Dietary recommendations Current Exercise Habits: Home exercise routine, Type of exercise: Other - see comments;strength training/weights;treadmill(incumbent bike, golfing), Time (Minutes): > 60, Frequency (Times/Week): 5, Weekly  Exercise (Minutes/Week): 0, Intensity: Moderate, Exercise limited by: None identified  Goals    . Increase physical activity     Starting 08/15/2017, I will continue to golf for at least 5 hours 3-4 days per week and do yard work as needed.        Fall Risk Fall Risk  08/15/2017 08/11/2016 03/30/2015 07/25/2013  Falls in the past year? No No No No   Depression Screen PHQ 2/9 Scores 08/15/2017 08/11/2016 03/30/2015 07/25/2013  PHQ - 2 Score 0 0 0 0  PHQ- 9 Score 0 - - -    Cognitive Function MMSE - Mini Mental State Exam  08/15/2017 08/11/2016  Orientation to time 5 5  Orientation to Place 5 5  Registration 3 3  Attention/ Calculation 0 0  Recall 2 3  Recall-comments unable to recall 1 of 3 words -  Language- name 2 objects 0 0  Language- repeat 1 1  Language- follow 3 step command 3 3  Language- read & follow direction 0 0  Write a sentence 0 0  Copy design 0 0  Total score 19 20     PLEASE NOTE: A Mini-Cog screen was completed. Maximum score is 20. A value of 0 denotes this part of [place] MMSE was not completed or the patient failed this part of the Mini-Cog screening.   Mini-Cog Screening Orientation to Time - Max 5 pts Orientation to Place - Max 5 pts Registration - Max 3 pts Recall - Max 3 pts Language Repeat - Max 1 pts Language Follow 3 Step Command - Max 3 pts     Immunization History  Administered Date(s) Administered  . Influenza-Unspecified 11/03/2013  . Pneumococcal Conjugate-13 07/25/2013  . Pneumococcal Polysaccharide-23 07/18/2012  . Zoster 07/18/2012    Screening Tests Health Maintenance  Topic Date Due  . INFLUENZA VACCINE  04/03/2018 (Originally 08/03/2017)  . TETANUS/TDAP  08/12/2026 (Originally 08[birth year]/1965)  . COLONOSCOPY  11/05/2017  . Hepatitis C Screening  Completed  . PNA vac Low Risk Adult  Completed      Plan:     I have personally reviewed, addressed, and noted the following in the patient's chart:  A. Medical and social history B. Use of  alcohol, tobacco or illicit drugs  C. Current medications and supplements D. Functional ability and status E.  Nutritional status F.  Physical activity G. Advance directives H. List of other physicians I.  Hospitalizations, surgeries, and ER visits in previous 12 months J.  [place] to include hearing, vision, cognitive, depression L. Referrals and appointments - none  In addition, I have reviewed and discussed with patient certain preventive protocols, quality metrics, and best practice recommendations. A written personalized care plan for preventive services as well as general preventive health recommendations were provided to patient.  See attached scanned questionnaire for additional information.   Signed,   Lindell Noe, MHA, BS, LPN Health Coach

## 2017-08-15 NOTE — Progress Notes (Signed)
PCP notes:   Health maintenance:  Flu vaccine - addressed  Abnormal screenings:   Mini-Cog score: 19/20 MMSE - Mini Mental State Exam 08/15/2017 08/11/2016  Orientation to time 5 5  Orientation to Place 5 5  Registration 3 3  Attention/ Calculation 0 0  Recall 2 3  Recall-comments unable to recall 1 of 3 words -  Language- name 2 objects 0 0  Language- repeat 1 1  Language- follow 3 step command 3 3  Language- read & follow direction 0 0  Write a sentence 0 0  Copy design 0 0  Total score 19 20    Patient concerns:   Pain in right hand, right hip, and left rotator cuff.   Nurse concerns:  None  Next PCP appt:   08/31/17 @ 0820

## 2017-08-29 ENCOUNTER — Encounter: Payer: Self-pay | Admitting: Family Medicine

## 2017-08-29 DIAGNOSIS — I10 Essential (primary) hypertension: Secondary | ICD-10-CM

## 2017-08-29 HISTORY — DX: Essential (primary) hypertension: I10

## 2017-08-29 NOTE — Progress Notes (Signed)
Dr. Frederico Hamman T. Braidyn Peace, MD, [facility] [place], [address] Phone: [phone] Fax: [phone]  08/31/2017  Patient: Xavier Perry, MRN: [id number], DOB: 1945/01/07, 72 y.o.  Primary Physician:  Owens Loffler, MD   Chief Complaint  Patient presents with  . Annual Exam    Part 2   Subjective:   Xavier Perry is a 72 y.o. very pleasant male patient who presents with the following:  HTN: Tolerating all medications without side effects Stable and at goal No CP, no sob. No HA.  BP Readings from Last 3 Encounters:  08/31/17 130/70  08/15/17 118/80  02/15/17 825/05    Basic Metabolic Panel:    Component Value Date/Time   NA 139 08/15/2017 1144   K 4.6 08/15/2017 1144   CL 101 08/15/2017 1144   CO2 31 08/15/2017 1144   BUN 18 08/15/2017 1144   CREATININE 1.00 08/15/2017 1144   GLUCOSE 99 08/15/2017 1144   CALCIUM 9.8 08/15/2017 1144    Lipids: Doing well, stable. Had some SE and aching with lipitor.  Panel reviewed with patient.  Lipids:    Component Value Date/Time   CHOL 172 08/15/2017 1144   TRIG 99.0 08/15/2017 1144   HDL 73.60 08/15/2017 1144   LDLDIRECT 150.0 07/10/2012 0847   VLDL 19.8 08/15/2017 1144   CHOLHDL 2 08/15/2017 1144    Lab Results  Component Value Date   ALT 25 08/15/2017   AST 21 08/15/2017   ALKPHOS 80 08/15/2017   BILITOT 0.9 08/15/2017    H/o TIA  Ongoing smoker  R hip is doing poorly.   New onset ED - no prior meds  Past Medical History, Surgical History, Social History, Family History, Problem List, Medications, and Allergies have been reviewed and updated if relevant.  Patient Active Problem List   Diagnosis Date Noted  . Hypertension 08/29/2017  . Chronic bilateral low back pain with bilateral sciatica 02/16/2017  . Hyperlipidemia LDL goal <70 02/15/2017  . TIA (transient ischemic attack) 11/15/2016  . Smoker 08/18/2016  . Osteoarthritis of left knee  01/02/2015  . Abdominal aneurysm without mention of rupture 09/19/2013  . Personal history of colonic adenomas 08/30/2012  . GERD (gastroesophageal reflux disease) 09/19/2011    Past Medical History:  Diagnosis Date  . Basal cell carcinoma   . Chronic bilateral low back pain with bilateral sciatica 02/16/2017  . GERD (gastroesophageal reflux disease) 09/19/2011  . History of repair of rotator cuff 09/19/2011   L 2009 and 2010  . Hypertension 08/29/2017  . Personal history of colonic adenomas 08/30/2012  . Squamous cell carcinoma in situ of skin 09/19/2011  . TIA (transient ischemic attack)     Past Surgical History:  Procedure Laterality Date  . BASAL CELL CARCINOMA EXCISION    . COLONOSCOPY  08-30-2012  . ROTATOR CUFF REPAIR Left 2009 and 2010  . TONSILECTOMY, ADENOIDECTOMY, BILATERAL MYRINGOTOMY AND TUBES  1967    Social History   Socioeconomic History  . Marital status: Married    Spouse name: Not on file  . Number of children: Not on file  . Years of education: Not on file  . Highest education level: Not on file  Occupational History  . Not on file  Social Needs  . Financial resource strain: Not on file  . Food insecurity:    Worry: Not on file    Inability: Not on file  . Transportation needs:    Medical: Not on  file    Non-medical: Not on file  Tobacco Use  . Smoking status: Current Every Day Smoker    Packs/day: 0.50    Types: Cigarettes  . Smokeless tobacco: Never Used  . Tobacco comment: pt will discuss tobacco cessation with PCP  Substance and Sexual Activity  . Alcohol use: Yes    Alcohol/week: 17.0 standard drinks    Types: 14 Glasses of wine, 3 Standard drinks or equivalent per week    Comment: 2 glasses of wine daily  . Drug use: No  . Sexual activity: Not Currently  Lifestyle  . Physical activity:    Days per week: Not on file    Minutes per session: Not on file  . Stress: Not on file  Relationships  . Social connections:    Talks on phone: Not  on file    Gets together: Not on file    Attends religious service: Not on file    Active member of club or organization: Not on file    Attends meetings of clubs or organizations: Not on file    Relationship status: Not on file  . Intimate partner violence:    Fear of current or ex partner: Not on file    Emotionally abused: Not on file    Physically abused: Not on file    Forced sexual activity: Not on file  Other Topics Concern  . Not on file  Social History Narrative  . Not on file    Family History  Problem Relation Age of Onset  . Varicose Veins Mother   . Cancer Father   . Colon cancer Neg Hx   . Esophageal cancer Neg Hx   . Rectal cancer Neg Hx   . Stomach cancer Neg Hx     No Known Allergies  Medication list reviewed and updated in full in [place].   GEN: No acute illnesses, no fevers, chills. GI: No n/v/d, eating normally Pulm: No SOB Interactive and getting along well at home.  Otherwise, ROS is as per the HPI.  Objective:   BP 130/70   Pulse (!) 59   Temp 98.3 F (36.8 C) (Oral)   Ht 5\' 9"  (1.753 m)   Wt 208 lb 8 oz (94.6 kg)   BMI 30.79 kg/m   GEN: WDWN, NAD, Non-toxic, A & O x 3 HEENT: Atraumatic, Normocephalic. Neck supple. No masses, No LAD. Ears and Nose: No external deformity. CV: RRR, No M/G/R. No JVD. No thrill. No extra heart sounds. PULM: CTA B, no wheezes, crackles, rhonchi. No retractions. No resp. distress. No accessory muscle use. EXTR: No c/c/e NEURO Normal gait.  PSYCH: Normally interactive. Conversant. Not depressed or anxious appearing.  Calm demeanor.   Laboratory and Imaging Data: Results for orders placed or performed in visit on 08/15/17  Lipid Panel  Result Value Ref Range   Cholesterol 172 0 - 200 mg/dL   Triglycerides 99.0 0.0 - 149.0 mg/dL   HDL 73.60 >39.00 mg/dL   VLDL 19.8 0.0 - 40.0 mg/dL   LDL Cholesterol 79 0 - 99 mg/dL   Total CHOL/HDL Ratio 2    NonHDL 98.87   CBC with Differential/Platelet    Result Value Ref Range   WBC 10.4 4.0 - 10.5 K/uL   RBC 4.71 4.22 - 5.81 Mil/uL   Hemoglobin 14.8 13.0 - 17.0 g/dL   HCT 43.2 39.0 - 52.0 %   MCV 91.7 78.0 - 100.0 fl   MCHC 34.3 30.0 - 36.0 g/dL  RDW 13.2 11.5 - 15.5 %   Platelets 245.0 150.0 - 400.0 K/uL   Neutrophils Relative % 70.5 43.0 - 77.0 %   Lymphocytes Relative 21.9 12.0 - 46.0 %   Monocytes Relative 5.5 3.0 - 12.0 %   Eosinophils Relative 1.4 0.0 - 5.0 %   Basophils Relative 0.7 0.0 - 3.0 %   Neutro Abs 7.3 1.4 - 7.7 K/uL   Lymphs Abs 2.3 0.7 - 4.0 K/uL   Monocytes Absolute 0.6 0.1 - 1.0 K/uL   Eosinophils Absolute 0.1 0.0 - 0.7 K/uL   Basophils Absolute 0.1 0.0 - 0.1 K/uL  Hepatic Function Panel  Result Value Ref Range   Total Bilirubin 0.9 0.2 - 1.2 mg/dL   Bilirubin, Direct 0.2 0.0 - 0.3 mg/dL   Alkaline Phosphatase 80 39 - 117 U/L   AST 21 0 - 37 U/L   ALT 25 0 - 53 U/L   Total Protein 7.1 6.0 - 8.3 g/dL   Albumin 4.3 3.5 - 5.2 g/dL  Basic Metabolic Panel  Result Value Ref Range   Sodium 139 135 - 145 mEq/L   Potassium 4.6 3.5 - 5.1 mEq/L   Chloride 101 96 - 112 mEq/L   CO2 31 19 - 32 mEq/L   Glucose, Bld 99 70 - 99 mg/dL   BUN 18 6 - 23 mg/dL   Creatinine, Ser 1.00 0.40 - 1.50 mg/dL   Calcium 9.8 8.4 - 10.5 mg/dL   GFR 78.29 >60.00 mL/min  PSA, Medicare ([facility])  Result Value Ref Range   PSA 0.23 0.10 - 4.00 ng/ml  Hemoglobin A1c  Result Value Ref Range   Hgb A1c MFr Bld 6.4 4.6 - 6.5 %     Assessment and Plan:   Hyperlipidemia LDL goal <70  Essential hypertension  TIA (transient ischemic attack)  Smoker  ED (erectile dysfunction) of organic origin  Change to crestor Trial of revatio for ed  Work on weight - a1c 6.4  Follow-up: 1 yr  Meds ordered this encounter  Medications  . rosuvastatin (CRESTOR) 10 MG tablet    Sig: Take 1 tablet (10 mg total) by mouth daily.    Dispense:  90 tablet    Refill:  3  . sildenafil (REVATIO) 20 MG tablet    Sig: Generic Revatio / Sildanefil  20 mg. 1 - 5 tabs 30 mins prior to intercourse.    Dispense:  20 tablet    Refill:  11   No orders of the defined types were placed in this encounter.   Signed,  Maud Deed. Sheryn Aldaz, MD   Allergies as of 08/31/2017   No Known Allergies     Medication List        Accurate as of 08/31/17 10:15 AM. Always use your most recent med list.          aspirin 81 MG tablet Take 1 tablet (81 mg total) daily by mouth.   hydrochlorothiazide 12.5 MG tablet Commonly known as:  HYDRODIURIL Take 1 tablet (12.5 mg total) by mouth daily.   omeprazole 20 MG capsule Commonly known as:  PRILOSEC Take 20 mg by mouth daily as needed.   rosuvastatin 10 MG tablet Commonly known as:  CRESTOR Take 1 tablet (10 mg total) by mouth daily.   sildenafil 20 MG tablet Commonly known as:  REVATIO Generic Revatio / Sildanefil 20 mg. 1 - 5 tabs 30 mins prior to intercourse.

## 2017-08-31 ENCOUNTER — Ambulatory Visit (INDEPENDENT_AMBULATORY_CARE_PROVIDER_SITE_OTHER): Payer: Medicare Other | Admitting: Family Medicine

## 2017-08-31 ENCOUNTER — Encounter: Payer: Self-pay | Admitting: Family Medicine

## 2017-08-31 VITALS — BP 130/70 | HR 59 | Temp 98.3°F | Ht 69.0 in | Wt 208.5 lb

## 2017-08-31 DIAGNOSIS — N529 Male erectile dysfunction, unspecified: Secondary | ICD-10-CM | POA: Diagnosis not present

## 2017-08-31 DIAGNOSIS — E785 Hyperlipidemia, unspecified: Secondary | ICD-10-CM

## 2017-08-31 DIAGNOSIS — F17200 Nicotine dependence, unspecified, uncomplicated: Secondary | ICD-10-CM | POA: Diagnosis not present

## 2017-08-31 DIAGNOSIS — I10 Essential (primary) hypertension: Secondary | ICD-10-CM

## 2017-08-31 DIAGNOSIS — G459 Transient cerebral ischemic attack, unspecified: Secondary | ICD-10-CM

## 2017-08-31 MED ORDER — ROSUVASTATIN CALCIUM 10 MG PO TABS: 10.0000 mg | ORAL_TABLET | Freq: Every day | ORAL | 3 refills | Status: DC

## 2017-08-31 MED ORDER — SILDENAFIL CITRATE 20 MG PO TABS: ORAL_TABLET | ORAL | 11 refills | Status: DC

## 2017-10-23 ENCOUNTER — Telehealth: Payer: Self-pay | Admitting: Family Medicine

## 2017-10-23 NOTE — Telephone Encounter (Signed)
I have sent a msg to billing and I will follow up with pt when I hear back.

## 2017-10-23 NOTE — Telephone Encounter (Signed)
Copied from [place] [phone]. Topic: Complaint - Billing/Coding >> Oct 23, 2017  2:30 PM Blase Mess A wrote: DOS: 08/15/17 Details of complaint: collection venus blood, venipuncture  How would the patient like to see this issue resolved? Patient received bill $12 after his CPE-Patient states for his cpe to be done he would have had to have been stuck. Please advise   Will automatically be routed to [facility].

## 2017-10-25 NOTE — Telephone Encounter (Signed)
I left msg for pt. This has been refiled so it should be covered.

## 2017-10-26 ENCOUNTER — Ambulatory Visit: Payer: Medicare Other

## 2017-10-27 DIAGNOSIS — J019 Acute sinusitis, unspecified: Secondary | ICD-10-CM | POA: Diagnosis not present

## 2017-11-09 ENCOUNTER — Ambulatory Visit (INDEPENDENT_AMBULATORY_CARE_PROVIDER_SITE_OTHER): Payer: Medicare Other

## 2017-11-09 DIAGNOSIS — Z23 Encounter for immunization: Secondary | ICD-10-CM

## 2017-11-20 DIAGNOSIS — Z96641 Presence of right artificial hip joint: Secondary | ICD-10-CM | POA: Diagnosis not present

## 2017-12-18 DIAGNOSIS — Z01818 Encounter for other preprocedural examination: Secondary | ICD-10-CM | POA: Diagnosis not present

## 2017-12-18 DIAGNOSIS — M25551 Pain in right hip: Secondary | ICD-10-CM | POA: Diagnosis not present

## 2017-12-20 ENCOUNTER — Ambulatory Visit (INDEPENDENT_AMBULATORY_CARE_PROVIDER_SITE_OTHER): Payer: Medicare Other | Admitting: Family Medicine

## 2017-12-20 ENCOUNTER — Encounter: Payer: Self-pay | Admitting: Family Medicine

## 2017-12-20 VITALS — BP 132/70 | HR 77 | Temp 98.2°F | Ht 69.0 in | Wt 213.8 lb

## 2017-12-20 DIAGNOSIS — Z01818 Encounter for other preprocedural examination: Secondary | ICD-10-CM

## 2017-12-20 DIAGNOSIS — M1611 Unilateral primary osteoarthritis, right hip: Secondary | ICD-10-CM

## 2017-12-20 NOTE — Progress Notes (Signed)
Dr. Frederico Hamman T. Aravind Chrismer, MD, [facility] [place], [address] Phone: [phone] Fax: [phone]  12/20/2017  Patient: Xavier Perry, MRN: [id number], DOB: 04-10-1945, 72 y.o.  Primary Physician:  Owens Loffler, MD   Chief Complaint  Patient presents with  . Surgical Clearance    Total Hip Replacement 01/04/2018   Subjective:   Dear Dr. Melvyn Novas,  Thank you for having me see Xavier Perry in consultation today at [facility] at [facility] for his problem with presurgical clearance for total hip arthroplasty.  As you may recall, he is a 72 y.o. year old male with a history of advanced R hip OA.  He also has a history of a TIA, HTN, hyperlipidemia, and he is a current smoker.   [facility] / [place] Dr. Melvyn Novas.   Wants to stop ASA within 7-10 days.  Able to walk up a couple of flights of stairs.  He is a very active gentleman, he is an avid golfer, and he is able to easily exercise in a light capacity.  He does not have any chest pain or shortness of breath.  Does not have any known cardiac history and he had a normal echocardiogram last year.  He does have a history of TIA, and he does take a 81 mg aspirin for cerebrovascular prevention.  [facility] Metabolic Panel:    Component Value Date/Time   NA 139 08/15/2017 1144   K 4.6 08/15/2017 1144   CL 101 08/15/2017 1144   CO2 31 08/15/2017 1144   BUN 18 08/15/2017 1144   CREATININE 1.00 08/15/2017 1144   GLUCOSE 99 08/15/2017 1144   CALCIUM 9.8 08/15/2017 1144     Patient Active Problem List   Diagnosis Date Noted  . Hypertension 08/29/2017  . Chronic bilateral low back pain with bilateral sciatica 02/16/2017  . Hyperlipidemia LDL goal <70 02/15/2017  . TIA (transient ischemic attack) 11/15/2016  . Smoker 08/18/2016  . Osteoarthritis of left knee 01/02/2015  . Abdominal aneurysm without mention of rupture 09/19/2013  . Personal history of  colonic adenomas 08/30/2012  . GERD (gastroesophageal reflux disease) 09/19/2011    Past Medical History:  Diagnosis Date  . Basal cell carcinoma   . Chronic bilateral low back pain with bilateral sciatica 02/16/2017  . GERD (gastroesophageal reflux disease) 09/19/2011  . History of repair of rotator cuff 09/19/2011   L 2009 and 2010  . Hypertension 08/29/2017  . Personal history of colonic adenomas 08/30/2012  . Squamous cell carcinoma in situ of skin 09/19/2011  . TIA (transient ischemic attack)     Past Surgical History:  Procedure Laterality Date  . BASAL CELL CARCINOMA EXCISION    . COLONOSCOPY  08-30-2012  . ROTATOR CUFF REPAIR Left 2009 and 2010  . TONSILECTOMY, ADENOIDECTOMY, BILATERAL MYRINGOTOMY AND TUBES  1967    Social History   Socioeconomic History  . Marital status: Married    Spouse name: Not on file  . Number of children: Not on file  . Years of education: Not on file  . Highest education level: Not on file  Occupational History  . Not on file  Social Needs  . Financial resource strain: Not on file  . Food insecurity:    Worry: Not on file    Inability: Not on file  . Transportation needs:    Medical: Not on file    Non-medical: Not on file  Tobacco Use  .  Smoking status: Current Every Day Smoker    Packs/day: 0.50    Types: Cigarettes  . Smokeless tobacco: Never Used  . Tobacco comment: pt will discuss tobacco cessation with PCP  Substance and Sexual Activity  . Alcohol use: Yes    Alcohol/week: 17.0 standard drinks    Types: 14 Glasses of wine, 3 Standard drinks or equivalent per week    Comment: 2 glasses of wine daily  . Drug use: No  . Sexual activity: Not Currently  Lifestyle  . Physical activity:    Days per week: Not on file    Minutes per session: Not on file  . Stress: Not on file  Relationships  . Social connections:    Talks on phone: Not on file    Gets together: Not on file    Attends religious service: Not on file    Active  member of club or organization: Not on file    Attends meetings of clubs or organizations: Not on file    Relationship status: Not on file  . Intimate partner violence:    Fear of current or ex partner: Not on file    Emotionally abused: Not on file    Physically abused: Not on file    Forced sexual activity: Not on file  Other Topics Concern  . Not on file  Social History Narrative  . Not on file    Family History  Problem Relation Age of Onset  . Varicose Veins Mother   . Cancer Father   . Colon cancer Neg Hx   . Esophageal cancer Neg Hx   . Rectal cancer Neg Hx   . Stomach cancer Neg Hx     No Known Allergies  Medication list reviewed and updated in full in [place].   GEN: No fevers, chills. Nontoxic. Primarily MSK c/o today. MSK: Detailed in the HPI GI: tolerating PO intake without difficulty Neuro: No numbness, parasthesias, or tingling associated. Otherwise, the pertinent positives and negatives are listed above and in the HPI, otherwise a full review of systems has been reviewed and is negative unless noted positive.   Objective:   BP 132/70   Pulse 77   Temp 98.2 F (36.8 C) (Oral)   Ht '5\' 9"'  (1.753 m)   Wt 213 lb 12 oz (97 kg)   SpO2 97%   BMI 31.57 kg/m    GEN: WDWN, NAD, Non-toxic, A & O x 3 HEENT: Atraumatic, Normocephalic. Neck supple. No masses, No LAD. Ears and Nose: No external deformity. CV: RRR, No M/G/R. No JVD. No thrill. No extra heart sounds. PULM: CTA B, no wheezes, crackles, rhonchi. No retractions. No resp. distress. No accessory muscle use. ABD: S, NT, ND, +BS. No rebound. No HSM. EXTR: No c/c/e NEURO Normal gait.  PSYCH: Normally interactive. Conversant. Not depressed or anxious appearing.  Calm demeanor.    Radiology:  Results for orders placed or performed in visit on 08/15/17  Lipid Panel  Result Value Ref Range   Cholesterol 172 0 - 200 mg/dL   Triglycerides 99.0 0.0 - 149.0 mg/dL   HDL 73.60 >39.00 mg/dL   VLDL  19.8 0.0 - 40.0 mg/dL   LDL Cholesterol 79 0 - 99 mg/dL   Total CHOL/HDL Ratio 2    NonHDL 98.87   CBC with Differential/Platelet  Result Value Ref Range   WBC 10.4 4.0 - 10.5 K/uL   RBC 4.71 4.22 - 5.81 Mil/uL   Hemoglobin 14.8 13.0 - 17.0 g/dL  HCT 43.2 39.0 - 52.0 %   MCV 91.7 78.0 - 100.0 fl   MCHC 34.3 30.0 - 36.0 g/dL   RDW 13.2 11.5 - 15.5 %   Platelets 245.0 150.0 - 400.0 K/uL   Neutrophils Relative % 70.5 43.0 - 77.0 %   Lymphocytes Relative 21.9 12.0 - 46.0 %   Monocytes Relative 5.5 3.0 - 12.0 %   Eosinophils Relative 1.4 0.0 - 5.0 %   Basophils Relative 0.7 0.0 - 3.0 %   Neutro Abs 7.3 1.4 - 7.7 K/uL   Lymphs Abs 2.3 0.7 - 4.0 K/uL   Monocytes Absolute 0.6 0.1 - 1.0 K/uL   Eosinophils Absolute 0.1 0.0 - 0.7 K/uL   Basophils Absolute 0.1 0.0 - 0.1 K/uL  Hepatic Function Panel  Result Value Ref Range   Total Bilirubin 0.9 0.2 - 1.2 mg/dL   Bilirubin, Direct 0.2 0.0 - 0.3 mg/dL   Alkaline Phosphatase 80 39 - 117 U/L   AST 21 0 - 37 U/L   ALT 25 0 - 53 U/L   Total Protein 7.1 6.0 - 8.3 g/dL   Albumin 4.3 3.5 - 5.2 g/dL  Basic Metabolic Panel  Result Value Ref Range   Sodium 139 135 - 145 mEq/L   Potassium 4.6 3.5 - 5.1 mEq/L   Chloride 101 96 - 112 mEq/L   CO2 31 19 - 32 mEq/L   Glucose, Bld 99 70 - 99 mg/dL   BUN 18 6 - 23 mg/dL   Creatinine, Ser 1.00 0.40 - 1.50 mg/dL   Calcium 9.8 8.4 - 10.5 mg/dL   GFR 78.29 >60.00 mL/min  PSA, Medicare ([facility])  Result Value Ref Range   PSA 0.23 0.10 - 4.00 ng/ml  Hemoglobin A1c  Result Value Ref Range   Hgb A1c MFr Bld 6.4 4.6 - 6.5 %     *[facility]*                  [address] [place]                        [place], [place] [address]                            [phone]  ------------------------------------------------------------------- Transthoracic Echocardiography  Patient:    Lillian, Tigges MR #:       [id number] Study Date: 11/23/2016 Gender:     M Age:        71 Height:      175.3 cm Weight:     96.8 kg BSA:        2.2 m^2 Pt. Status: Room:   ATTENDING    Default, Provider [id number]  [place], [place]  [place], [place]  PERFORMING   [place], [place]  SONOGRAPHER  Pilar Jarvis, RVT, RDCS, RDMS  cc:  ------------------------------------------------------------------- LV EF: 55% -   65%  ------------------------------------------------------------------- History:   PMH:   Transient ischemic attack.  Risk factors: Current tobacco use. Hypertension. Dyslipidemia.  ------------------------------------------------------------------- Study Conclusions  - Technically difficult study due to chest wall and/or lung   interference. - Left ventricle: The cavity size was normal. Wall thickness was at   the upper limits of normal. Systolic function was normal. The   estimated ejection fraction was in the range of 55% to 65%. Left   ventricular diastolic function parameters were normal for  the   patient&'s age. - Aorta: Ascending aortic diameter: 38 mm (S). - Ascending aorta: The ascending aorta was mildly dilated. - Aortic arch: The aortic arch was mildly dilated. - Right ventricle: The cavity size was mildly dilated. Systolic   function was normal. - Right atrium: The atrium was mildly dilated.  Impressions:  - No obvious embolic source, though evaluation of the valves is   very limited due to poor acoustic windows.  ------------------------------------------------------------------- Study data:  No prior study was available for comparison.  Study status:  Routine.  Procedure:  Transthoracic echocardiography. Image quality was adequate. The study was technically difficult, as a result of poor acoustic windows.  Study completion:  There were no complications.          Transthoracic echocardiography.  M-mode, complete 2D, spectral Doppler, and color Doppler.  Birthdate: Patient birthdate: May 26, 1945.  Age:   Patient is 72 yr old.  Sex: Gender: male.    BMI: 31.5 kg/m^2.  Blood pressure:     120/78 Patient status:  Outpatient.  Study date:  Study date: 11/23/2016. Study time: 06:42 AM.  -------------------------------------------------------------------  ------------------------------------------------------------------- Left ventricle:  The cavity size was normal. Wall thickness was at the upper limits of normal. Systolic function was normal. The estimated ejection fraction was in the range of 55% to 65%. Images were inadequate for LV wall motion assessment. Left ventricular diastolic function parameters were normal for the patient&'s age.   ------------------------------------------------------------------- Aortic valve:  Poorly visualized.  Doppler:  Transvalvular velocity was within the normal range. There was no stenosis. There was no significant regurgitation.    VTI ratio of LVOT to aortic valve: 0.8. Valve area (VTI): 3.02 cm^2. Indexed valve area (VTI): 1.37 cm^2/m^2. Mean velocity ratio of LVOT to aortic valve: 0.81. Valve area (Vmean): 3.08 cm^2. Indexed valve area (Vmean): 1.4 cm^2/m^2.   Mean gradient (S): 2 mm Hg.  ------------------------------------------------------------------- Aorta:  Aortic root: The aortic root was upper normal in size. Ascending aorta: The ascending aorta was mildly dilated. Aortic arch: The aortic arch was mildly dilated.  ------------------------------------------------------------------- Mitral valve:  Poorly visualized.  Doppler:  Transvalvular velocity was within the normal range. There was no evidence for stenosis. There was no regurgitation.    Valve area by pressure half-time: 3.67 cm^2. Indexed valve area by pressure half-time: 1.67 cm^2/m^2.    Peak gradient (D): 3 mm Hg.  ------------------------------------------------------------------- Left atrium:  The atrium was at the upper limits of normal in size.     ------------------------------------------------------------------- Right ventricle:  The cavity size was mildly dilated. Systolic function was normal.  ------------------------------------------------------------------- Pulmonic valve:   Poorly visualized.  Doppler:  Transvalvular velocity was within the normal range. There was no regurgitation.   ------------------------------------------------------------------- Tricuspid valve:   Structurally normal valve.   Leaflet separation was normal.  Doppler:  Transvalvular velocity was within the normal range. There was no significant regurgitation.  ------------------------------------------------------------------- Pulmonary artery:   Poorly visualized.  Systolic pressure could not be accurately estimated.  ------------------------------------------------------------------- Right atrium:  The atrium was mildly dilated.  ------------------------------------------------------------------- Pericardium:  There was no pericardial effusion.  ------------------------------------------------------------------- Systemic veins: Inferior vena cava: Not well visualized.  ------------------------------------------------------------------- Measurements   Left ventricle                           Value          Reference  LV ID, ED, PLAX chordal          (L)  42    mm       43 - 52  LV ID, ES, PLAX chordal                  30    mm       23 - 38  LV fx shortening, PLAX chordal           29    %        >=29  LV PW thickness, ED                      1     mm       ----------  IVS/LV PW ratio, ED                      1              <=1.3  Stroke volume, 2D                        73    ml       ----------  Stroke volume/bsa, 2D                    33    ml/m^2   ----------  LV e&', lateral                           8.83  cm/s     ----------  LV E/e&', lateral                         8.96           ----------  LV e&', medial                             8.45  cm/s     ----------  LV E/e&', medial                          9.36           ----------  LV e&', average                           8.64  cm/s     ----------  LV E/e&', average                         9.16           ----------    Ventricular septum                       Value          Reference  IVS thickness, ED                        1     mm       ----------    LVOT                                     Value          Reference  LVOT ID, S  22    mm       ----------  LVOT area                                3.8   cm^2     ----------  LVOT ID                                  22    mm       ----------  LVOT mean velocity, S                    57.8  cm/s     ----------  LVOT VTI, S                              19.1  cm       ----------  Stroke volume (SV), LVOT DP              72.6  ml       ----------  Stroke index (SV/bsa), LVOT DP           33    ml/m^2   ----------    Aortic valve                             Value          Reference  Aortic valve mean velocity, S            71.4  cm/s     ----------  Aortic valve VTI, S                      24    cm       ----------  Aortic mean gradient, S                  2     mm Hg    ----------  VTI ratio, LVOT/AV                       0.8            ----------  Aortic valve area, VTI                   3.02  cm^2     ----------  Aortic valve area/bsa, VTI               1.37  cm^2/m^2 ----------  Velocity ratio, mean, LVOT/AV            0.81           ----------  Aortic valve area, mean velocity         3.08  cm^2     ----------  Aortic valve area/bsa, mean              1.4   cm^2/m^2 ----------  velocity    Aorta                                    Value          Reference  Aortic root ID, ED  36    mm       ----------  Ascending aorta ID, A-P, S               38    mm       ----------    Left atrium                              Value          Reference  LA ID, A-P, ES                            36    mm       ----------  LA ID/bsa, A-P                           1.64  cm/m^2   <=2.2  LA volume, S                             57.3  ml       ----------  LA volume/bsa, S                         26    ml/m^2   ----------  LA volume, ES, 1-p A4C                   49.9  ml       ----------  LA volume/bsa, ES, 1-p A4C               22.7  ml/m^2   ----------  LA volume, ES, 1-p A2C                   63.3  ml       ----------  LA volume/bsa, ES, 1-p A2C               28.8  ml/m^2   ----------    Mitral valve                             Value          Reference  Mitral E-wave peak velocity              79.1  cm/s     ----------  Mitral A-wave peak velocity              96.5  cm/s     ----------  Mitral deceleration time                 204   ms       150 - 230  Mitral pressure half-time                60    ms       ----------  Mitral peak gradient, D                  3     mm Hg    ----------  Mitral E/A ratio, peak                   0.8            ----------  Mitral valve area,  PHT, DP               3.67  cm^2     ----------  Mitral valve area/bsa, PHT, DP           1.67  cm^2/m^2 ----------    Right atrium                             Value          Reference  RA ID, S-I, ES, A4C                      47.5  mm       34 - 49  RA area, ES, A4C                 (H)     19.8  cm^2     8.3 - 19.5  RA volume, ES, A/L                       63.6  ml       ----------  RA volume/bsa, ES, A/L                   28.9  ml/m^2   ----------    Right ventricle                          Value          Reference  RV ID, minor axis, ED, A4C base          44    mm       ----------  TAPSE                                    27    mm       ----------  RV s&', lateral, S                        13    cm/s     ----------    Pulmonic valve                           Value          Reference  Pulmonic valve peak velocity, S          82    cm/s     ----------  Legend: (L)  and  (H)  mark values  outside specified reference range.  ------------------------------------------------------------------- Prepared and Electronically Authenticated by  Nelva Bush, MD 2018-11-21T15:57:23  Assessment and Plan:   Primary localized osteoarthritis of right hip  Preoperative clearance - Plan: EKG 12-Lead  EKG: Normal sinus rhythm. Normal axis, normal R wave progression, No acute ST elevation or depression.   Patient represents low risk for right-sided total hip arthroplasty for hip arthritis.  He is easily able to achieve a 4 met equivalent of exercise or significantly higher than this, he has a normal creatinine, and his only significant history is a 1 prior TIA.  It is entirely reasonable for him to stop his aspirin prior to surgery with minimal risk, and I recommended that he stop this 10 days prior to surgery.  Potential benefits of right-sided total hip arthroplasty  outweigh potential risks.  We will see the patient for routine follow-ups only.  Feel free to contact me with any questions.  Cc: Dr. Melvyn Novas  Signed,  Maud Deed. Tyeasha Ebbs, MD   Patient's Medications  New Prescriptions   No medications on file  Previous Medications   ASPIRIN 81 MG TABLET    Take 1 tablet (81 mg total) daily by mouth.   HYDROCHLOROTHIAZIDE (HYDRODIURIL) 12.5 MG TABLET    Take 1 tablet (12.5 mg total) by mouth daily.   OMEPRAZOLE (PRILOSEC) 20 MG CAPSULE    Take 20 mg by mouth daily as needed.   ROSUVASTATIN (CRESTOR) 10 MG TABLET    Take 1 tablet (10 mg total) by mouth daily.   SILDENAFIL (REVATIO) 20 MG TABLET    Generic Revatio / Sildanefil 20 mg. 1 - 5 tabs 30 mins prior to intercourse.  Modified Medications   No medications on file  Discontinued Medications   No medications on file

## 2017-12-28 DIAGNOSIS — Z01811 Encounter for preprocedural respiratory examination: Secondary | ICD-10-CM | POA: Diagnosis not present

## 2017-12-28 DIAGNOSIS — Z01810 Encounter for preprocedural cardiovascular examination: Secondary | ICD-10-CM | POA: Diagnosis not present

## 2017-12-28 DIAGNOSIS — M1611 Unilateral primary osteoarthritis, right hip: Secondary | ICD-10-CM | POA: Diagnosis not present

## 2017-12-28 DIAGNOSIS — Z01812 Encounter for preprocedural laboratory examination: Secondary | ICD-10-CM | POA: Diagnosis not present

## 2017-12-28 DIAGNOSIS — G459 Transient cerebral ischemic attack, unspecified: Secondary | ICD-10-CM | POA: Diagnosis not present

## 2017-12-28 DIAGNOSIS — Z01818 Encounter for other preprocedural examination: Secondary | ICD-10-CM | POA: Diagnosis not present

## 2018-01-03 HISTORY — PX: TOTAL HIP ARTHROPLASTY: SHX124

## 2018-01-04 DIAGNOSIS — M25551 Pain in right hip: Secondary | ICD-10-CM | POA: Diagnosis not present

## 2018-01-04 DIAGNOSIS — M169 Osteoarthritis of hip, unspecified: Secondary | ICD-10-CM | POA: Diagnosis not present

## 2018-01-04 DIAGNOSIS — Z8673 Personal history of transient ischemic attack (TIA), and cerebral infarction without residual deficits: Secondary | ICD-10-CM | POA: Diagnosis not present

## 2018-01-04 DIAGNOSIS — F17210 Nicotine dependence, cigarettes, uncomplicated: Secondary | ICD-10-CM | POA: Diagnosis present

## 2018-01-04 DIAGNOSIS — M1611 Unilateral primary osteoarthritis, right hip: Secondary | ICD-10-CM | POA: Diagnosis present

## 2018-01-04 DIAGNOSIS — E785 Hyperlipidemia, unspecified: Secondary | ICD-10-CM | POA: Diagnosis present

## 2018-01-04 DIAGNOSIS — K219 Gastro-esophageal reflux disease without esophagitis: Secondary | ICD-10-CM | POA: Diagnosis present

## 2018-01-04 DIAGNOSIS — I10 Essential (primary) hypertension: Secondary | ICD-10-CM | POA: Diagnosis not present

## 2018-01-08 DIAGNOSIS — M25551 Pain in right hip: Secondary | ICD-10-CM | POA: Diagnosis not present

## 2018-01-08 DIAGNOSIS — M25651 Stiffness of right hip, not elsewhere classified: Secondary | ICD-10-CM | POA: Diagnosis not present

## 2018-01-11 DIAGNOSIS — M25651 Stiffness of right hip, not elsewhere classified: Secondary | ICD-10-CM | POA: Diagnosis not present

## 2018-01-11 DIAGNOSIS — M25551 Pain in right hip: Secondary | ICD-10-CM | POA: Diagnosis not present

## 2018-01-16 DIAGNOSIS — M25551 Pain in right hip: Secondary | ICD-10-CM | POA: Diagnosis not present

## 2018-01-16 DIAGNOSIS — M25651 Stiffness of right hip, not elsewhere classified: Secondary | ICD-10-CM | POA: Diagnosis not present

## 2018-01-19 DIAGNOSIS — M25651 Stiffness of right hip, not elsewhere classified: Secondary | ICD-10-CM | POA: Diagnosis not present

## 2018-01-19 DIAGNOSIS — M25551 Pain in right hip: Secondary | ICD-10-CM | POA: Diagnosis not present

## 2018-01-23 DIAGNOSIS — M25551 Pain in right hip: Secondary | ICD-10-CM | POA: Diagnosis not present

## 2018-01-23 DIAGNOSIS — M25651 Stiffness of right hip, not elsewhere classified: Secondary | ICD-10-CM | POA: Diagnosis not present

## 2018-01-26 DIAGNOSIS — M25651 Stiffness of right hip, not elsewhere classified: Secondary | ICD-10-CM | POA: Diagnosis not present

## 2018-01-26 DIAGNOSIS — M25551 Pain in right hip: Secondary | ICD-10-CM | POA: Diagnosis not present

## 2018-01-30 DIAGNOSIS — M25551 Pain in right hip: Secondary | ICD-10-CM | POA: Diagnosis not present

## 2018-01-30 DIAGNOSIS — M25651 Stiffness of right hip, not elsewhere classified: Secondary | ICD-10-CM | POA: Diagnosis not present

## 2018-02-02 DIAGNOSIS — M25551 Pain in right hip: Secondary | ICD-10-CM | POA: Diagnosis not present

## 2018-02-02 DIAGNOSIS — M25651 Stiffness of right hip, not elsewhere classified: Secondary | ICD-10-CM | POA: Diagnosis not present

## 2018-02-05 DIAGNOSIS — M25651 Stiffness of right hip, not elsewhere classified: Secondary | ICD-10-CM | POA: Diagnosis not present

## 2018-02-05 DIAGNOSIS — M25551 Pain in right hip: Secondary | ICD-10-CM | POA: Diagnosis not present

## 2018-02-07 DIAGNOSIS — M25651 Stiffness of right hip, not elsewhere classified: Secondary | ICD-10-CM | POA: Diagnosis not present

## 2018-02-07 DIAGNOSIS — M25551 Pain in right hip: Secondary | ICD-10-CM | POA: Diagnosis not present

## 2018-02-09 ENCOUNTER — Other Ambulatory Visit: Payer: Self-pay | Admitting: Family Medicine

## 2018-02-14 DIAGNOSIS — M25651 Stiffness of right hip, not elsewhere classified: Secondary | ICD-10-CM | POA: Diagnosis not present

## 2018-02-14 DIAGNOSIS — M25551 Pain in right hip: Secondary | ICD-10-CM | POA: Diagnosis not present

## 2018-02-16 DIAGNOSIS — M25651 Stiffness of right hip, not elsewhere classified: Secondary | ICD-10-CM | POA: Diagnosis not present

## 2018-02-16 DIAGNOSIS — M25551 Pain in right hip: Secondary | ICD-10-CM | POA: Diagnosis not present

## 2018-02-21 DIAGNOSIS — M25551 Pain in right hip: Secondary | ICD-10-CM | POA: Diagnosis not present

## 2018-02-21 DIAGNOSIS — M25651 Stiffness of right hip, not elsewhere classified: Secondary | ICD-10-CM | POA: Diagnosis not present

## 2018-02-23 DIAGNOSIS — M25551 Pain in right hip: Secondary | ICD-10-CM | POA: Diagnosis not present

## 2018-02-23 DIAGNOSIS — M25651 Stiffness of right hip, not elsewhere classified: Secondary | ICD-10-CM | POA: Diagnosis not present

## 2018-02-27 DIAGNOSIS — M25551 Pain in right hip: Secondary | ICD-10-CM | POA: Diagnosis not present

## 2018-02-27 DIAGNOSIS — M25651 Stiffness of right hip, not elsewhere classified: Secondary | ICD-10-CM | POA: Diagnosis not present

## 2018-03-02 DIAGNOSIS — M19041 Primary osteoarthritis, right hand: Secondary | ICD-10-CM | POA: Diagnosis not present

## 2018-03-02 DIAGNOSIS — M19042 Primary osteoarthritis, left hand: Secondary | ICD-10-CM | POA: Diagnosis not present

## 2018-03-02 DIAGNOSIS — G5622 Lesion of ulnar nerve, left upper limb: Secondary | ICD-10-CM | POA: Diagnosis not present

## 2018-03-05 DIAGNOSIS — M25551 Pain in right hip: Secondary | ICD-10-CM | POA: Diagnosis not present

## 2018-03-05 DIAGNOSIS — M25651 Stiffness of right hip, not elsewhere classified: Secondary | ICD-10-CM | POA: Diagnosis not present

## 2018-03-07 ENCOUNTER — Encounter: Payer: Self-pay | Admitting: Internal Medicine

## 2018-03-13 DIAGNOSIS — M25551 Pain in right hip: Secondary | ICD-10-CM | POA: Diagnosis not present

## 2018-03-13 DIAGNOSIS — M25651 Stiffness of right hip, not elsewhere classified: Secondary | ICD-10-CM | POA: Diagnosis not present

## 2018-03-21 DIAGNOSIS — J019 Acute sinusitis, unspecified: Secondary | ICD-10-CM | POA: Diagnosis not present

## 2018-03-27 DIAGNOSIS — M25651 Stiffness of right hip, not elsewhere classified: Secondary | ICD-10-CM | POA: Diagnosis not present

## 2018-03-27 DIAGNOSIS — M25551 Pain in right hip: Secondary | ICD-10-CM | POA: Diagnosis not present

## 2018-04-30 DIAGNOSIS — M19042 Primary osteoarthritis, left hand: Secondary | ICD-10-CM | POA: Diagnosis not present

## 2018-04-30 DIAGNOSIS — M19041 Primary osteoarthritis, right hand: Secondary | ICD-10-CM | POA: Diagnosis not present

## 2018-06-18 ENCOUNTER — Encounter: Payer: Self-pay | Admitting: Family Medicine

## 2018-06-18 ENCOUNTER — Other Ambulatory Visit: Payer: Self-pay | Admitting: Family Medicine

## 2018-06-18 ENCOUNTER — Other Ambulatory Visit: Payer: Self-pay

## 2018-06-18 ENCOUNTER — Ambulatory Visit (INDEPENDENT_AMBULATORY_CARE_PROVIDER_SITE_OTHER): Payer: Medicare Other | Admitting: Family Medicine

## 2018-06-18 VITALS — BP 140/74 | HR 77 | Temp 98.0°F | Ht 69.0 in | Wt 223.2 lb

## 2018-06-18 DIAGNOSIS — M545 Low back pain, unspecified: Secondary | ICD-10-CM

## 2018-06-18 MED ORDER — TIZANIDINE HCL 4 MG PO TABS: 4.0000 mg | ORAL_TABLET | Freq: Every evening | ORAL | 0 refills | Status: DC | PRN

## 2018-06-18 MED ORDER — PREDNISONE 20 MG PO TABS: ORAL_TABLET | ORAL | 0 refills | Status: DC

## 2018-06-18 NOTE — Progress Notes (Signed)
Xavier Perry T. Xavier Raulerson, MD Primary Care and [place] at [facility] [place], [address] Phone: [phone]  FAX: [phone]  Xavier Perry - 73 y.o. male  MRN [id number]  Date of Birth: [birth year]  Visit Date: 06/18/2018  PCP: Owens Loffler, MD  Referred by: Owens Loffler, MD  Chief Complaint  Patient presents with  . Back Pain    Lower    Subjective:   Xavier Perry is a 73 y.o. very pleasant male patient who presents with the following: Back Pain  ongoing for approximately:  2 weeks The patient has had back pain before. The back pain is localized into the lumbar spine area. They also describe no radiculopathy.  At the each for the week.  [place].   R > L  No radiculopathy.  Severe pain.  Heat, ice, alternating. Tylenol.  No numbness or tingling. No bowel or bladder incontinence. No focal weakness. Prior interventions: none Physical therapy: No Chiropractic manipulations: No Acupuncture: No Osteopathic manipulation: No Heat or cold: Minimal effect  Past Medical History, Surgical History, Family History, Medications, Allergies have been reviewed and updated if relevant.  Patient Active Problem List   Diagnosis Date Noted  . Hypertension 08/29/2017  . Chronic bilateral low back pain with bilateral sciatica 02/16/2017  . Hyperlipidemia LDL goal <70 02/15/2017  . TIA (transient ischemic attack) 11/15/2016  . Smoker 08/18/2016  . Osteoarthritis of left knee 01/02/2015  . Abdominal aneurysm without mention of rupture 09/19/2013  . Personal history of colonic adenomas 08/30/2012  . GERD (gastroesophageal reflux disease) 09/19/2011    Past Medical History:  Diagnosis Date  . Basal cell carcinoma   . Chronic bilateral low back pain with bilateral sciatica 02/16/2017  . GERD (gastroesophageal reflux disease) 09/19/2011  . History of repair of rotator cuff 09/19/2011   L 2009 and 2010  . Hypertension  08/29/2017  . Personal history of colonic adenomas 08/30/2012  . Squamous cell carcinoma in situ of skin 09/19/2011  . TIA (transient ischemic attack)     Past Surgical History:  Procedure Laterality Date  . BASAL CELL CARCINOMA EXCISION    . COLONOSCOPY  08-30-2012  . ROTATOR CUFF REPAIR Left 2009 and 2010  . TONSILECTOMY, ADENOIDECTOMY, BILATERAL MYRINGOTOMY AND TUBES  1967    Social History   Socioeconomic History  . Marital status: Married    Spouse name: Not on file  . Number of children: Not on file  . Years of education: Not on file  . Highest education level: Not on file  Occupational History  . Not on file  Social Needs  . Financial resource strain: Not on file  . Food insecurity    Worry: Not on file    Inability: Not on file  . Transportation needs    Medical: Not on file    Non-medical: Not on file  Tobacco Use  . Smoking status: Current Some Day Smoker    Packs/day: 0.50    Types: Cigarettes  . Smokeless tobacco: Never Used  . Tobacco comment: pt will discuss tobacco cessation with PCP  Substance and Sexual Activity  . Alcohol use: Yes    Alcohol/week: 17.0 standard drinks    Types: 14 Glasses of wine, 3 Standard drinks or equivalent per week    Comment: 2 glasses of wine daily  . Drug use: No  . Sexual activity: Not Currently  Lifestyle  . Physical activity    Days per week: Not  on file    Minutes per session: Not on file  . Stress: Not on file  Relationships  . Social Herbalist on phone: Not on file    Gets together: Not on file    Attends religious service: Not on file    Active member of club or organization: Not on file    Attends meetings of clubs or organizations: Not on file    Relationship status: Not on file  . Intimate partner violence    Fear of current or ex partner: Not on file    Emotionally abused: Not on file    Physically abused: Not on file    Forced sexual activity: Not on file  Other Topics Concern  . Not on file   Social History Narrative  . Not on file    Family History  Problem Relation Age of Onset  . Varicose Veins Mother   . Cancer Father   . Colon cancer Neg Hx   . Esophageal cancer Neg Hx   . Rectal cancer Neg Hx   . Stomach cancer Neg Hx     No Known Allergies  Medication list reviewed and updated in full in [place].  GEN: No fevers, chills. Nontoxic. Primarily MSK c/o today. MSK: Detailed in the HPI GI: tolerating PO intake without difficulty Neuro: As above  Otherwise the pertinent positives of the ROS are noted above.    Objective:   Blood pressure 140/74, pulse 77, temperature 98 F (36.7 C), temperature source Other (Comment), height 5\' 9"  (1.753 m), weight 223 lb 4 oz (101.3 kg), SpO2 94 %.  Gen: Well-developed,well-nourished,in no acute distress; alert,appropriate and cooperative throughout examination HEENT: Normocephalic and atraumatic without obvious abnormalities.  Ears, externally no deformities Pulm: Breathing comfortably in no respiratory distress Range of motion at  the waist: Flexion, rotation and lateral bending: flexion to 50 deg, severe pain with R rotational movement, o/w unremarkable  No echymosis or edema Rises to examination table with no difficulty Gait: minimally antalgic  Inspection/Deformity: No abnormality Paraspinus T:  l1 - s1 on the R  B Ankle Dorsiflexion (L5,4): 5/5 B Great Toe Dorsiflexion (L5,4): 5/5 Heel Walk (L5): WNL Toe Walk (S1): WNL Rise/Squat (L4): WNL, mild pain  SENSORY B Medial Foot (L4): WNL B Dorsum (L5): WNL B Lateral (S1): WNL Light Touch: WNL Pinprick: WNL  REFLEXES Knee (L4): 2+ Ankle (S1): 2+  B SLR, seated: neg B SLR, supine: neg B FABER: neg B Reverse FABER: neg B Greater Troch: NT B Log Roll: neg B Stork: NT B Sciatic Notch: NT  Radiology: No results found.  Assessment and Plan:   Acute right-sided low back pain without sciatica  Anatomy reviewed.  Steroids in this case  Start  with medications, core rehab, and progress from there following low back pain algorithm. No red flags are present.  Follow-up: No follow-ups on file.  Meds ordered this encounter  Medications  . predniSONE (DELTASONE) 20 MG tablet    Sig: 2 tabs po daily for 5 days, then 1 tab po daily for 5 days    Dispense:  15 tablet    Refill:  0  . tiZANidine (ZANAFLEX) 4 MG tablet    Sig: Take 1 tablet (4 mg total) by mouth at bedtime as needed for muscle spasms.    Dispense:  30 tablet    Refill:  0   No orders of the defined types were placed in this encounter.   Signed,  Izabelle Daus T. Brandon Wiechman, MD   Allergies as of 06/18/2018   No Known Allergies     Medication List       Accurate as of June 18, 2018  1:55 PM. If you have any questions, ask your nurse or doctor.        aspirin 81 MG tablet Take 1 tablet (81 mg total) daily by mouth.   hydrochlorothiazide 12.5 MG tablet Commonly known as: HYDRODIURIL TAKE 1 TABLET BY MOUTH A DAY   omeprazole 20 MG capsule Commonly known as: PRILOSEC Take 20 mg by mouth daily as needed.   predniSONE 20 MG tablet Commonly known as: DELTASONE 2 tabs po daily for 5 days, then 1 tab po daily for 5 days Started by: Owens Loffler, MD   rosuvastatin 10 MG tablet Commonly known as: CRESTOR TAKE 1 TABLET BY MOUTH ONCE DAILY   sildenafil 20 MG tablet Commonly known as: Revatio Generic Revatio / Sildanefil 20 mg. 1 - 5 tabs 30 mins prior to intercourse.   tiZANidine 4 MG tablet Commonly known as: ZANAFLEX Take 1 tablet (4 mg total) by mouth at bedtime as needed for muscle spasms. Started by: Owens Loffler, MD

## 2018-08-23 ENCOUNTER — Ambulatory Visit: Payer: Medicare Other

## 2018-08-23 ENCOUNTER — Other Ambulatory Visit (INDEPENDENT_AMBULATORY_CARE_PROVIDER_SITE_OTHER): Payer: Medicare Other

## 2018-08-23 DIAGNOSIS — Z125 Encounter for screening for malignant neoplasm of prostate: Secondary | ICD-10-CM | POA: Diagnosis not present

## 2018-08-23 DIAGNOSIS — E785 Hyperlipidemia, unspecified: Secondary | ICD-10-CM | POA: Diagnosis not present

## 2018-08-23 DIAGNOSIS — R7303 Prediabetes: Secondary | ICD-10-CM | POA: Diagnosis not present

## 2018-08-23 DIAGNOSIS — Z79899 Other long term (current) drug therapy: Secondary | ICD-10-CM | POA: Diagnosis not present

## 2018-08-23 DIAGNOSIS — G459 Transient cerebral ischemic attack, unspecified: Secondary | ICD-10-CM | POA: Diagnosis not present

## 2018-08-23 LAB — HEPATIC FUNCTION PANEL
ALT: 48 U/L (ref 0–53)
AST: 44 U/L — ABNORMAL HIGH (ref 0–37)
Albumin: 4.5 g/dL (ref 3.5–5.2)
Alkaline Phosphatase: 72 U/L (ref 39–117)
Bilirubin, Direct: 0.1 mg/dL (ref 0.0–0.3)
Total Bilirubin: 0.6 mg/dL (ref 0.2–1.2)
Total Protein: 6.9 g/dL (ref 6.0–8.3)

## 2018-08-23 LAB — BASIC METABOLIC PANEL
BUN: 19 mg/dL (ref 6–23)
CO2: 27 mEq/L (ref 19–32)
Calcium: 9.2 mg/dL (ref 8.4–10.5)
Chloride: 101 mEq/L (ref 96–112)
Creatinine, Ser: 0.81 mg/dL (ref 0.40–1.50)
GFR: 93.66 mL/min (ref >60.00)
Glucose, Bld: 124 mg/dL — ABNORMAL HIGH (ref 70–99)
Potassium: 4.1 mEq/L (ref 3.5–5.1)
Sodium: 137 mEq/L (ref 135–145)

## 2018-08-23 LAB — HEMOGLOBIN A1C: Hgb A1c MFr Bld: 6.5 % (ref 4.6–6.5)

## 2018-08-23 LAB — CBC WITH DIFFERENTIAL/PLATELET
Basophils Absolute: 0.1 10*3/uL (ref 0.0–0.1)
Basophils Relative: 1.2 % (ref 0.0–3.0)
Eosinophils Absolute: 0.2 10*3/uL (ref 0.0–0.7)
Eosinophils Relative: 2.7 % (ref 0.0–5.0)
HCT: 43 % (ref 39.0–52.0)
Hemoglobin: 14.4 g/dL (ref 13.0–17.0)
Lymphocytes Relative: 20.3 % (ref 12.0–46.0)
Lymphs Abs: 1.6 10*3/uL (ref 0.7–4.0)
MCHC: 33.4 g/dL (ref 30.0–36.0)
MCV: 92.4 fl (ref 78.0–100.0)
Monocytes Absolute: 0.7 10*3/uL (ref 0.1–1.0)
Monocytes Relative: 8.8 % (ref 3.0–12.0)
Neutro Abs: 5.2 10*3/uL (ref 1.4–7.7)
Neutrophils Relative %: 67 % (ref 43.0–77.0)
Platelets: 234 10*3/uL (ref 150.0–400.0)
RBC: 4.66 Mil/uL (ref 4.22–5.81)
RDW: 13.2 % (ref 11.5–15.5)
WBC: 7.8 10*3/uL (ref 4.0–10.5)

## 2018-08-23 LAB — LIPID PANEL
Cholesterol: 152 mg/dL (ref 0–200)
HDL: 72.7 mg/dL (ref >39.00)
LDL Cholesterol: 62 mg/dL (ref 0–99)
NonHDL: 78.81
Total CHOL/HDL Ratio: 2
Triglycerides: 82 mg/dL (ref 0.0–149.0)
VLDL: 16.4 mg/dL (ref 0.0–40.0)

## 2018-08-24 LAB — PSA, TOTAL WITH REFLEX TO PSA, FREE: PSA, Total: 0.2 ng/mL (ref <=4.0)

## 2018-08-26 NOTE — Progress Notes (Signed)
Xavier Perry. Roneka Gilpin, MD Primary Care and [place] at [facility] [place], [address] Phone: [phone]  FAX: [place] - 73 y.o. male  MRN [id number]  Date of Birth: 1945/03/01  Visit Date: 08/27/2018  PCP: Owens Loffler, MD  Referred by: Owens Loffler, MD  Chief Complaint  Patient presents with  . Medicare Wellness   Patient Care Team: Owens Loffler, MD as PCP - General (Family Medicine) Subjective:   Xavier Perry is a 73 y.o. pleasant patient who presents for a medicare wellness examination:  Preventative Health Maintenance Visit:  Health Maintenance Summary Reviewed and updated, unless pt declines services.  Tobacco History Reviewed. Alcohol: No concerns, no excessive use Exercise Habits: Some activity, rec at least 30 mins 5 times a week, playing some golf.  Playing 3 times a week.   STD concerns: no risk or activity to increase risk Drug Use: None Encouraged self-testicular check   Aches a lot. Hands, wrists, multiple other joints  Takes a 325 mg of tylenol. Takes his other medication.  Hurts all the time.   3 cm leg length discrepancy  Diabetes Mellitus: Tolerating Medications: new onset and with no meds Compliance with diet: fair, Body mass index is 31.15 kg/m. Exercise: minimal / intermittent Avg blood sugars at home: not checking Foot problems: none Hypoglycemia: none No nausea, vomitting, blurred vision, polyuria.  Lab Results  Component Value Date   HGBA1C 6.5 08/23/2018   HGBA1C 6.4 08/15/2017   HGBA1C 6.4 03/24/2015   Lab Results  Component Value Date   LDLCALC 62 08/23/2018   CREATININE 0.81 08/23/2018    Wt Readings from Last 3 Encounters:  08/27/18 212 lb 8 oz (96.4 kg)  06/18/18 223 lb 4 oz (101.3 kg)  12/20/17 213 lb 12 oz (97 kg)     Health Maintenance  Topic Date Due  . URINE MICROALBUMIN  08/09/1956  . COLONOSCOPY  11/05/2017  . INFLUENZA  VACCINE  11/02/2018 (Originally 08/04/2018)  . TETANUS/TDAP  08/12/2026 (Originally 08/09/1965)  . Hepatitis C Screening  Completed  . PNA vac Low Risk Adult  Completed    Immunization History  Administered Date(s) Administered  . Influenza,inj,Quad PF,6+ Mos 11/09/2017  . Influenza-Unspecified 11/03/2013  . Pneumococcal Conjugate-13 07/25/2013  . Pneumococcal Polysaccharide-23 07/18/2012  . Zoster 07/18/2012    Patient Active Problem List   Diagnosis Date Noted  . Hypertension 08/29/2017  . Chronic bilateral low back pain with bilateral sciatica 02/16/2017  . Hyperlipidemia LDL goal <70 02/15/2017  . TIA (transient ischemic attack) 11/15/2016  . Smoker 08/18/2016  . Osteoarthritis of left knee 01/02/2015  . Abdominal aneurysm without mention of rupture 09/19/2013  . Personal history of colonic adenomas 08/30/2012  . GERD (gastroesophageal reflux disease) 09/19/2011   Past Medical History:  Diagnosis Date  . Basal cell carcinoma   . Chronic bilateral low back pain with bilateral sciatica 02/16/2017  . GERD (gastroesophageal reflux disease) 09/19/2011  . History of repair of rotator cuff 09/19/2011   L 2009 and 2010  . Hypertension 08/29/2017  . Personal history of colonic adenomas 08/30/2012  . Squamous cell carcinoma in situ of skin 09/19/2011  . TIA (transient ischemic attack)    Past Surgical History:  Procedure Laterality Date  . BASAL CELL CARCINOMA EXCISION    . COLONOSCOPY  08-30-2012  . ROTATOR CUFF REPAIR Left 2009 and 2010  . TONSILECTOMY, ADENOIDECTOMY, BILATERAL MYRINGOTOMY AND TUBES  1967   Social History  Socioeconomic History  . Marital status: Married    Spouse name: Not on file  . Number of children: Not on file  . Years of education: Not on file  . Highest education level: Not on file  Occupational History  . Not on file  Social Needs  . Financial resource strain: Not on file  . Food insecurity    Worry: Not on file    Inability: Not on file  .  Transportation needs    Medical: Not on file    Non-medical: Not on file  Tobacco Use  . Smoking status: Current Some Day Smoker    Packs/day: 0.50    Types: Cigarettes  . Smokeless tobacco: Never Used  . Tobacco comment: pt will discuss tobacco cessation with PCP  Substance and Sexual Activity  . Alcohol use: Yes    Alcohol/week: 17.0 standard drinks    Types: 14 Glasses of wine, 3 Standard drinks or equivalent per week    Comment: 2 glasses of wine daily  . Drug use: No  . Sexual activity: Not Currently  Lifestyle  . Physical activity    Days per week: Not on file    Minutes per session: Not on file  . Stress: Not on file  Relationships  . Social Herbalist on phone: Not on file    Gets together: Not on file    Attends religious service: Not on file    Active member of club or organization: Not on file    Attends meetings of clubs or organizations: Not on file    Relationship status: Not on file  . Intimate partner violence    Fear of current or ex partner: Not on file    Emotionally abused: Not on file    Physically abused: Not on file    Forced sexual activity: Not on file  Other Topics Concern  . Not on file  Social History Narrative  . Not on file   Family History  Problem Relation Age of Onset  . Varicose Veins Mother   . Cancer Father   . Colon cancer Neg Hx   . Esophageal cancer Neg Hx   . Rectal cancer Neg Hx   . Stomach cancer Neg Hx    No Known Allergies  Medication list has been reviewed and updated.   General: Denies fever, chills, sweats. No significant weight loss. Eyes: Denies blurring,significant itching ENT: Denies earache, sore throat, and hoarseness. Cardiovascular: Denies chest pains, palpitations, dyspnea on exertion Respiratory: Denies cough, dyspnea at rest,wheeezing Breast: no concerns about lumps GI: Denies nausea, vomiting, diarrhea, constipation, change in bowel habits, abdominal pain, melena, hematochezia GU: Denies  penile discharge, ED, urinary flow / outflow problems. No STD concerns. Musculoskeletal: Denies back pain, joint pain Derm: Denies rash, itching Neuro: Denies  paresthesias, frequent falls, frequent headaches Psych: Denies depression, anxiety Endocrine: Denies cold intolerance, heat intolerance, polydipsia Heme: Denies enlarged lymph nodes Allergy: No hayfever  Objective:   BP 130/90   Pulse 80   Temp 98.3 F (36.8 C) (Temporal)   Ht 5' 9.25" (1.759 m)   Wt 212 lb 8 oz (96.4 kg)   SpO2 98%   BMI 31.15 kg/m  Fall Risk  08/27/2018 08/15/2017 08/11/2016 03/30/2015 07/25/2013  Falls in the past year? 0 No No No No   Ideal Body Weight: Weight in (lb) to have BMI = 25: 170.2  Hearing Screening   125Hz  250Hz  500Hz  1000Hz  2000Hz  3000Hz  4000Hz  6000Hz  8000Hz   Right  ear:           Left ear:           Comments: Has Bilateral Hearing Aides but don'Perry usual wears them.  Vision Screening Comments: Wears Glasses-[facility] Exam [facility] 08/2017 Depression screen [facility] 2/9 08/27/2018 08/15/2017 08/11/2016 03/30/2015 07/25/2013  Decreased Interest 0 0 0 0 0  Down, Depressed, Hopeless 0 0 0 0 0  PHQ - 2 Score 0 0 0 0 0  Altered sleeping - 0 - - -  Tired, decreased energy - 0 - - -  Change in appetite - 0 - - -  Feeling bad or failure about yourself  - 0 - - -  Trouble concentrating - 0 - - -  Moving slowly or fidgety/restless - 0 - - -  Suicidal thoughts - 0 - - -  PHQ-9 Score - 0 - - -  Difficult doing work/chores - Not difficult at all - - -     GEN: well developed, well nourished, no acute distress Eyes: conjunctiva and lids normal, PERRLA, EOMI ENT: TM clear, nares clear, oral exam WNL Neck: supple, no lymphadenopathy, no thyromegaly, no JVD Pulm: clear to auscultation and percussion, respiratory effort normal CV: regular rate and rhythm, S1-S2, no murmur, rub or gallop, no bruits, peripheral pulses normal and symmetric, no cyanosis, clubbing, edema or varicosities GI: soft, non-tender; no  hepatosplenomegaly, masses; active bowel sounds all quadrants GU: no hernia, testicular mass, penile discharge Lymph: no cervical, axillary or inguinal adenopathy MSK: gait normal, muscle tone and strength WNL, hand and wrist joint complaints SKIN: clear, good turgor, color WNL, no rashes, lesions, or ulcerations Neuro: normal mental status, normal strength, sensation, and motion Psych: alert; oriented to person, place and time, normally interactive and not anxious or depressed in appearance.  All labs reviewed with patient. Results for orders placed or performed in visit on 08/23/18  Lipid panel  Result Value Ref Range   Cholesterol 152 0 - 200 mg/dL   Triglycerides 82.0 0.0 - 149.0 mg/dL   HDL 72.70 >39.00 mg/dL   VLDL 16.4 0.0 - 40.0 mg/dL   LDL Cholesterol 62 0 - 99 mg/dL   Total CHOL/HDL Ratio 2    NonHDL 78.81   CBC with Differential/Platelet  Result Value Ref Range   WBC 7.8 4.0 - 10.5 K/uL   RBC 4.66 4.22 - 5.81 Mil/uL   Hemoglobin 14.4 13.0 - 17.0 g/dL   HCT 43.0 39.0 - 52.0 %   MCV 92.4 78.0 - 100.0 fl   MCHC 33.4 30.0 - 36.0 g/dL   RDW 13.2 11.5 - 15.5 %   Platelets 234.0 150.0 - 400.0 K/uL   Neutrophils Relative % 67.0 43.0 - 77.0 %   Lymphocytes Relative 20.3 12.0 - 46.0 %   Monocytes Relative 8.8 3.0 - 12.0 %   Eosinophils Relative 2.7 0.0 - 5.0 %   Basophils Relative 1.2 0.0 - 3.0 %   Neutro Abs 5.2 1.4 - 7.7 K/uL   Lymphs Abs 1.6 0.7 - 4.0 K/uL   Monocytes Absolute 0.7 0.1 - 1.0 K/uL   Eosinophils Absolute 0.2 0.0 - 0.7 K/uL   Basophils Absolute 0.1 0.0 - 0.1 K/uL  Hepatic function panel  Result Value Ref Range   Total Bilirubin 0.6 0.2 - 1.2 mg/dL   Bilirubin, Direct 0.1 0.0 - 0.3 mg/dL   Alkaline Phosphatase 72 39 - 117 U/L   AST 44 (H) 0 - 37 U/L   ALT 48 0 - 53 U/L  Total Protein 6.9 6.0 - 8.3 g/dL   Albumin 4.5 3.5 - 5.2 g/dL  Basic metabolic panel  Result Value Ref Range   Sodium 137 135 - 145 mEq/L   Potassium 4.1 3.5 - 5.1 mEq/L   Chloride  101 96 - 112 mEq/L   CO2 27 19 - 32 mEq/L   Glucose, Bld 124 (H) 70 - 99 mg/dL   BUN 19 6 - 23 mg/dL   Creatinine, Ser 0.81 0.40 - 1.50 mg/dL   Calcium 9.2 8.4 - 10.5 mg/dL   GFR 93.66 >60.00 mL/min  PSA, Total with Reflex to PSA, Free  Result Value Ref Range   PSA, Total 0.2 < OR = 4.0 ng/mL  Hemoglobin A1c  Result Value Ref Range   Hgb A1c MFr Bld 6.5 4.6 - 6.5 %    Assessment and Plan:     ICD-10-CM   1. Healthcare maintenance  Z00.00   2. Controlled type 2 diabetes mellitus without complication, without long-term current use of insulin (HCC)  E11.9 Ambulatory referral to diabetic education   >10 minutes spent in face to face time with patient, >50% spent in counselling or coordination of care: Outside of general health maintenance and Medicare wellness an additional 10 minutes was spent discussing new onset type 2 diabetes.  His A1c is 6.5, so I am not can start him on any medication.  He is already on a statin as well as aspirin.  I did send him for diabetic education with follow-up in 3 months.  Patient Instructions   Tylenol Extra Strength 500 mg: 2 tablets up to 4 times a day   Alleve 2 tabs by mouth two times a day over the counter: Take at least for 2 - 3 weeks. This is equal to a prescripton strength dose (GENERIC CHEAPER EQUIVALENT IS NAPROXEN SODIUM)     [facility] Low Glycemic Diet (Source: [facility], 2006)  Low Glycemic Foods (20-49) (Decrease risk of developing heart disease)  Best for Diabetes: Eat Mostly these  Breakfast Cereals: All-Bran All-Bran Fruit 'n Oats Fiber One Oatmeal (not instant) Oat bran  Fruits and fruit juices: (Limit to 1-2 servings per day) Apples Apricots (fresh & dried) Blackberries Blueberries Cherries Cranberries Peaches Pears Plums Prunes Grapefruit Raspberries Strawberries Tangerine  Juices: Apple juice Grapefruit juice Tomato juice  Beans and legumes (fresh-cooked): Black-eyed peas Butter  beans Chick peas Lentils  [place] beans Lima beans Kidney beans Navy beans Pinto beans Snow peas  Non-starchy vegetables: Asparagus, avocado, broccoli, cabbage, cauliflower, celery, cucumber, greens, lettuce, mushrooms, peppers, tomatoes, okra, onions, spinach, summer squash  Grains: Barley Bulgur Rye Wild rice  Nuts and oils : Almonds Peanuts Sunflower seeds Hazelnuts Pecans Walnuts Oils that are liquid at room temperature  Dairy, fish, meat, soy, and eggs: Milk, skim Lowfat cheese Yogurt, lowfat, fruit sugar sweetened Lean red meat Fish  Skinless chicken & [place] Shellfish Egg whites (up to 3 daily) Soy products  Egg yolks (up to 7 or _____ per week) Moderate Glycemic Foods (50-69)  OK sometimes with diabetes  Breakfast Cereals: Bran Buds Bran Chex Just Right Mini-Wheats  Special K Swiss muesli  Fruits: Banana (under-ripe) Dates Figs Grapes Kiwi Mango Oranges Raisins  Fruit Juices: Cranberry juice Orange juice  Beans and legumes: [place]-type baked beans Canned pinto, kidney, or navy beans Lorenzi peas  Vegetables: Beets Carrots  Sweet potato Yam Corn on the cob  Breads: Pita (pocket) bread Oat bran bread Pumpernickel bread Rye bread Wheat bread, high  fiber   Grains: Cornmeal Rice, brown Rice, white Couscous  Pasta: Macaroni Pizza, cheese Ravioli, meat filled Spaghetti, white   Nuts: Cashews Macadamia  Snacks: Chocolate Ice cream, lowfat Muffin Popcorn High Glycemic Foods (70-100)  Rare: Eat occaisionally with diabetes  THESE ARE THE WORST KIND OF FOODS FOR YOUR DIABETES  Breakfast Cereals: Cheerios Corn Chex Corn Flakes Cream of Wheat Grape Nuts Grape Nut Flakes Grits Nutri-Grain Puffed Rice Puffed Wheat Rice Chex Rice Krispies Shredded Wheat Team Total  Fruits: Pineapple Watermelon Banana (over-ripe) Beverages: Sodas, sweet tea, pineapple juice  Vegetables: Potato, baked, boiled, fried, mashed [place] fries Canned or frozen corn  Parsnips Winter squash  Breads: Most breads (white and whole grain) Bagels Bread sticks Bread stuffing Kaiser roll Dinner rolls  Grains: Rice, instant Tapioca, with milk Candy and most cookies  Snacks: Donuts Corn chips Jelly beans Pretzels Pastries        Health Maintenance Exam: The patient's preventative maintenance and recommended screening tests for an annual wellness exam were reviewed in full today. Brought up to date unless services declined.  Counselled on the importance of diet, exercise, and its role in overall health and mortality. The patient's FH and SH was reviewed, including their home life, tobacco status, and drug and alcohol status.  Follow-up in 1 year for physical exam or additional follow-up below.  I have personally reviewed the Medicare Annual Wellness questionnaire and have noted 1. The patient's medical and social history 2. Their use of alcohol, tobacco or illicit drugs 3. Their current medications and supplements 4. The patient's functional ability including ADL's, fall risks, home safety risks and hearing or visual             impairment. 5. Diet and physical activities 6. Evidence for depression or mood disorders 7. Reviewed Updated provider list, see scanned forms and [facility] Snapshot.  8. Reviewed whether or not the patient has HCPOA or living will, and discussed what this means with the patient.  Recommended he bring in a copy for his chart in [facility].  The patients weight, height, BMI and visual acuity have been recorded in the chart I have made referrals, counseling and provided education to the patient based review of the above and I have provided the pt with a written personalized care plan for preventive services.  I have provided the patient with a copy of your personalized plan for preventive services. Instructed to take the time to review along with their updated medication list.  Follow-up: Return in about 3 months (around 11/27/2018).  Or follow-up in 1 year if not noted.  Future Appointments  Date Time Provider [place]  11/22/2018  9:00 AM Timiya Howells, Frederico Hamman, MD [facility]    No orders of the defined types were placed in this encounter.  Medications Discontinued During This Encounter  Medication Reason  . predniSONE (DELTASONE) 20 MG tablet Completed Course  . tiZANidine (ZANAFLEX) 4 MG tablet Completed Course   Orders Placed This Encounter  Procedures  . Ambulatory referral to diabetic education    Signed,  Frederico Hamman Perry. Tamanika Heiney, MD   Allergies as of 08/27/2018   No Known Allergies     Medication List       Accurate as of August 27, 2018 11:59 PM. If you have any questions, ask your nurse or doctor.        STOP taking these medications   predniSONE 20 MG tablet Commonly known as: DELTASONE Stopped by: Owens Loffler, MD   tiZANidine 4 MG  tablet Commonly known as: ZANAFLEX Stopped by: Owens Loffler, MD     TAKE these medications   aspirin 81 MG tablet Take 1 tablet (81 mg total) daily by mouth.   hydrochlorothiazide 12.5 MG tablet Commonly known as: HYDRODIURIL TAKE 1 TABLET BY MOUTH A DAY   omeprazole 20 MG capsule Commonly known as: PRILOSEC Take 20 mg by mouth daily as needed.   rosuvastatin 10 MG tablet Commonly known as: CRESTOR TAKE 1 TABLET BY MOUTH ONCE DAILY   sildenafil 20 MG tablet Commonly known as: Revatio Generic Revatio / Sildanefil 20 mg. 1 - 5 tabs 30 mins prior to intercourse.

## 2018-08-27 ENCOUNTER — Ambulatory Visit (INDEPENDENT_AMBULATORY_CARE_PROVIDER_SITE_OTHER): Payer: Medicare Other | Admitting: Family Medicine

## 2018-08-27 ENCOUNTER — Other Ambulatory Visit: Payer: Self-pay

## 2018-08-27 ENCOUNTER — Encounter: Payer: Self-pay | Admitting: Family Medicine

## 2018-08-27 VITALS — BP 130/90 | HR 80 | Temp 98.3°F | Ht 69.25 in | Wt 212.5 lb

## 2018-08-27 DIAGNOSIS — Z0000 Encounter for general adult medical examination without abnormal findings: Secondary | ICD-10-CM

## 2018-08-27 DIAGNOSIS — E119 Type 2 diabetes mellitus without complications: Secondary | ICD-10-CM

## 2018-08-27 NOTE — Patient Instructions (Addendum)
Tylenol Extra Strength 500 mg: 2 tablets up to 4 times a day   Alleve 2 tabs by mouth two times a day over the counter: Take at least for 2 - 3 weeks. This is equal to a prescripton strength dose (GENERIC CHEAPER EQUIVALENT IS NAPROXEN SODIUM)     [facility] Low Glycemic Diet (Source: [facility], 2006)  Low Glycemic Foods (20-49) (Decrease risk of developing heart disease)  Best for Diabetes: Eat Mostly these  Breakfast Cereals: All-Bran All-Bran Fruit 'n Oats Fiber One Oatmeal (not instant) Oat bran  Fruits and fruit juices: (Limit to 1-2 servings per day) Apples Apricots (fresh & dried) Blackberries Blueberries Cherries Cranberries Peaches Pears Plums Prunes Grapefruit Raspberries Strawberries Tangerine  Juices: Apple juice Grapefruit juice Tomato juice  Beans and legumes (fresh-cooked): Black-eyed peas Butter beans Chick peas Lentils  Cimo beans Lima beans Kidney beans Navy beans Pinto beans Snow peas  Non-starchy vegetables: Asparagus, avocado, broccoli, cabbage, cauliflower, celery, cucumber, greens, lettuce, mushrooms, peppers, tomatoes, okra, onions, spinach, summer squash  Grains: Barley Bulgur Rye Wild rice  Nuts and oils : Almonds Peanuts Sunflower seeds Hazelnuts Pecans Walnuts Oils that are liquid at room temperature  Dairy, fish, meat, soy, and eggs: Milk, skim Lowfat cheese Yogurt, lowfat, fruit sugar sweetened Lean red meat Fish  Skinless chicken & [place] Shellfish Egg whites (up to 3 daily) Soy products  Egg yolks (up to 7 or _____ per week) Moderate Glycemic Foods (50-69)  OK sometimes with diabetes  Breakfast Cereals: Bran Buds Bran Chex Just Right Mini-Wheats  Special K Swiss muesli  Fruits: Banana (under-ripe) Dates Figs Grapes Kiwi Mango Oranges Raisins  Fruit Juices: Cranberry juice Orange juice  Beans and legumes: [place]-type baked beans Canned pinto, kidney, or navy beans Tucci  peas  Vegetables: Beets Carrots  Sweet potato Yam Corn on the cob  Breads: Pita (pocket) bread Oat bran bread Pumpernickel bread Rye bread Wheat bread, high fiber   Grains: Cornmeal Rice, brown Rice, white Couscous  Pasta: Macaroni Pizza, cheese Ravioli, meat filled Spaghetti, white   Nuts: Cashews Macadamia  Snacks: Chocolate Ice cream, lowfat Muffin Popcorn High Glycemic Foods (70-100)  Rare: Eat occaisionally with diabetes  THESE ARE THE WORST KIND OF FOODS FOR YOUR DIABETES  Breakfast Cereals: Cheerios Corn Chex Corn Flakes Cream of Wheat Grape Nuts Grape Nut Flakes Grits Nutri-Grain Puffed Rice Puffed Wheat Rice Chex Rice Krispies Shredded Wheat Team Total  Fruits: Pineapple Watermelon Banana (over-ripe) Beverages: Sodas, sweet tea, pineapple juice  Vegetables: Potato, baked, boiled, fried, mashed [place] fries Canned or frozen corn Parsnips Winter squash  Breads: Most breads (white and whole grain) Bagels Bread sticks Bread stuffing Kaiser roll Dinner rolls  Grains: Rice, instant Tapioca, with milk Candy and most cookies  Snacks: Donuts Corn chips Jelly beans Pretzels Pastries

## 2018-08-28 ENCOUNTER — Encounter: Payer: Self-pay | Admitting: Family Medicine

## 2018-09-15 ENCOUNTER — Other Ambulatory Visit: Payer: Self-pay | Admitting: Family Medicine

## 2018-09-25 ENCOUNTER — Ambulatory Visit (INDEPENDENT_AMBULATORY_CARE_PROVIDER_SITE_OTHER): Payer: Medicare Other

## 2018-09-25 DIAGNOSIS — Z23 Encounter for immunization: Secondary | ICD-10-CM

## 2018-09-25 NOTE — Progress Notes (Signed)
Xavier Beyersdorf T. [name], MD Primary Care and [place] at [facility] Xavier Perry, [address] Phone: [phone]  FAX: [place] - 73 y.o. male  MRN [id number]  Date of Birth: 1945-03-16  Visit Date: 09/26/2018  PCP: Xavier Loffler, MD  Referred by: Xavier Loffler, MD  Chief Complaint  Patient presents with  . Knee Pain    Left-Twisted hiking Saturday   Subjective:   Xavier Perry is a 73 y.o. very pleasant male patient with Body mass index is 31.26 kg/m. who presents with the following:  Pleasant gentleman, well-known who presents with ongoing L knee pain and known OA.  He has seen Xavier Perry from [facility] in the past, as well. He has had R hip total arthroplasty.  Tweaked knee and rock had some effusion.  Tried to walk the dog yesterday morning. Has 7/10 with movement. One foot at a time on stairs, and worse with driving.    Past Medical History, Surgical History, Social History, Family History, Problem List, Medications, and Allergies have been reviewed and updated if relevant.  Patient Active Problem List   Diagnosis Date Noted  . Hypertension 08/29/2017  . Chronic bilateral low back pain with bilateral sciatica 02/16/2017  . Hyperlipidemia LDL goal <70 02/15/2017  . TIA (transient ischemic attack) 11/15/2016  . Smoker 08/18/2016  . Osteoarthritis of left knee 01/02/2015  . Abdominal aneurysm without mention of rupture 09/19/2013  . Personal history of colonic adenomas 08/30/2012  . GERD (gastroesophageal reflux disease) 09/19/2011    Past Medical History:  Diagnosis Date  . Basal cell carcinoma   . Chronic bilateral low back pain with bilateral sciatica 02/16/2017  . GERD (gastroesophageal reflux disease) 09/19/2011  . History of repair of rotator cuff 09/19/2011   L 2009 and 2010  . Hypertension 08/29/2017  . Personal history of colonic adenomas 08/30/2012  . Squamous cell carcinoma in situ of  skin 09/19/2011  . TIA (transient ischemic attack)     Past Surgical History:  Procedure Laterality Date  . BASAL CELL CARCINOMA EXCISION    . COLONOSCOPY  08-30-2012  . ROTATOR CUFF REPAIR Left 2009 and 2010  . TONSILECTOMY, ADENOIDECTOMY, BILATERAL MYRINGOTOMY AND TUBES  1967    Social History   Socioeconomic History  . Marital status: Married    Spouse name: Not on file  . Number of children: Not on file  . Years of education: Not on file  . Highest education level: Not on file  Occupational History  . Not on file  Social Needs  . Financial resource strain: Not on file  . Food insecurity    Worry: Not on file    Inability: Not on file  . Transportation needs    Medical: Not on file    Non-medical: Not on file  Tobacco Use  . Smoking status: Current Some Day Smoker    Packs/day: 0.50    Types: Cigarettes  . Smokeless tobacco: Never Used  . Tobacco comment: pt will discuss tobacco cessation with PCP  Substance and Sexual Activity  . Alcohol use: Yes    Alcohol/week: 17.0 standard drinks    Types: 14 Glasses of wine, 3 Standard drinks or equivalent per week    Comment: 2 glasses of wine daily  . Drug use: No  . Sexual activity: Not Currently  Lifestyle  . Physical activity    Days per week: Not on file    Minutes per session:  Not on file  . Stress: Not on file  Relationships  . Social Herbalist on phone: Not on file    Gets together: Not on file    Attends religious service: Not on file    Active member of club or organization: Not on file    Attends meetings of clubs or organizations: Not on file    Relationship status: Not on file  . Intimate partner violence    Fear of current or ex partner: Not on file    Emotionally abused: Not on file    Physically abused: Not on file    Forced sexual activity: Not on file  Other Topics Concern  . Not on file  Social History Narrative  . Not on file    Family History  Problem Relation Age of Onset  .  Varicose Veins Mother   . Cancer Father   . Colon cancer Neg Hx   . Esophageal cancer Neg Hx   . Rectal cancer Neg Hx   . Stomach cancer Neg Hx     No Known Allergies  Medication list reviewed and updated in full in [place].  GEN: No fevers, chills. Nontoxic. Primarily MSK c/o today. MSK: Detailed in the HPI GI: tolerating PO intake without difficulty Neuro: No numbness, parasthesias, or tingling associated. Otherwise the pertinent positives of the ROS are noted above.   Objective:   BP 120/80   Pulse 67   Temp 98 F (36.7 C) (Temporal)   Ht 5' 9.25" (1.759 m)   Wt 213 lb 4 oz (96.7 kg)   SpO2 96%   BMI 31.26 kg/m    GEN: WDWN, NAD, Non-toxic, Alert & Oriented x 3 HEENT: Atraumatic, Normocephalic.  Ears and Nose: No external deformity. EXTR: No clubbing/cyanosis/edema NEURO: Normal gait.  PSYCH: Normally interactive. Conversant. Not depressed or anxious appearing.  Calm demeanor.   Knee:  LEFT Gait: Normal heel toe pattern ROM: 0-120 Effusion: mild Echymosis or edema: none Patellar tendon NT Painful PLICA: neg Patellar grind: negative Medial and lateral patellar facet loading: negative medial and lateral joint lines: postero-medial Mcmurray's pos Flexion-pinch pos Varus and valgus stress: stable Lachman: neg Ant and Post drawer: neg Hip abduction, IR, ER: WNL Hip flexion str: 5/5 Hip abd: 5/5 Quad: 5/5 VMO atrophy:No Hamstring concentric and eccentric: 5/5   Radiology: No results found.  Assessment and Plan:     ICD-10-CM   1. Internal derangement of left knee  M23.92   2. Chronic pain of left knee  M25.562    G89.29   3. Primary osteoarthritis of left knee  M17.12    With his exam and symptoms, I think that he likely has a degenerative meniscal tear and acute meniscal tear, posterior medial.  I would continue with NSAIDs and icing, but I think also organ to do a intra-articular injection today given his highly active lifestyle.   Aspiration/Injection Procedure Note Xavier Perry Dec 15, 1945 Date of procedure: 09/26/2018  Procedure: Large Joint Aspiration / Injection of Knee, LEFT Indications: Pain  Procedure Details Patient verbally consented to procedure. Risks (including potential rare risk of infection), benefits, and alternatives explained. Sterilely prepped with Chloraprep. Ethyl cholride used for anesthesia. 8 cc Lidocaine 1% mixed with 2 mL Depo-Medrol 40 mg injected using the anteromedial approach without difficulty. No complications with procedure and tolerated well. Patient had decreased pain post-injection. Medication: 2 mL of Depo-Medrol 40 mg, equaling Depo-Medrol 80 mg total   Follow-up: No follow-ups on file.  No orders of the defined types were placed in this encounter.  No orders of the defined types were placed in this encounter.   Signed,  Maud Deed. Tally Mckinnon, MD   Outpatient Encounter Medications as of 09/26/2018  Medication Sig  . aspirin 81 MG tablet Take 1 tablet (81 mg total) daily by mouth.  . hydrochlorothiazide (HYDRODIURIL) 12.5 MG tablet TAKE 1 TABLET BY MOUTH A DAY  . omeprazole (PRILOSEC) 20 MG capsule Take 20 mg by mouth daily as needed.  . rosuvastatin (CRESTOR) 10 MG tablet TAKE 1 TABLET BY MOUTH ONCE DAILY  . sildenafil (REVATIO) 20 MG tablet Generic Revatio / Sildanefil 20 mg. 1 - 5 tabs 30 mins prior to intercourse.   No facility-administered encounter medications on file as of 09/26/2018.

## 2018-09-26 ENCOUNTER — Encounter: Payer: Self-pay | Admitting: Family Medicine

## 2018-09-26 ENCOUNTER — Other Ambulatory Visit: Payer: Self-pay

## 2018-09-26 ENCOUNTER — Ambulatory Visit (INDEPENDENT_AMBULATORY_CARE_PROVIDER_SITE_OTHER): Payer: Medicare Other | Admitting: Family Medicine

## 2018-09-26 VITALS — BP 120/80 | HR 67 | Temp 98.0°F | Ht 69.25 in | Wt 213.2 lb

## 2018-09-26 DIAGNOSIS — G8929 Other chronic pain: Secondary | ICD-10-CM | POA: Diagnosis not present

## 2018-09-26 DIAGNOSIS — M2392 Unspecified internal derangement of left knee: Secondary | ICD-10-CM | POA: Diagnosis not present

## 2018-09-26 DIAGNOSIS — M1712 Unilateral primary osteoarthritis, left knee: Secondary | ICD-10-CM

## 2018-09-26 DIAGNOSIS — M25562 Pain in left knee: Secondary | ICD-10-CM

## 2018-09-26 MED ORDER — METHYLPREDNISOLONE ACETATE 40 MG/ML IJ SUSP
80.0000 mg | Freq: Once | INTRAMUSCULAR | Status: AC
  Administered 2018-09-26: 80 mg via INTRA_ARTICULAR

## 2018-10-02 ENCOUNTER — Encounter: Payer: Medicare Other | Attending: Family Medicine | Admitting: *Deleted

## 2018-10-02 ENCOUNTER — Other Ambulatory Visit: Payer: Self-pay

## 2018-10-02 ENCOUNTER — Encounter: Payer: Self-pay | Admitting: *Deleted

## 2018-10-02 VITALS — BP 110/74 | Ht 69.5 in | Wt 209.6 lb

## 2018-10-02 DIAGNOSIS — Z713 Dietary counseling and surveillance: Secondary | ICD-10-CM | POA: Insufficient documentation

## 2018-10-02 DIAGNOSIS — E119 Type 2 diabetes mellitus without complications: Secondary | ICD-10-CM

## 2018-10-02 DIAGNOSIS — Z6830 Body mass index (BMI) 30.0-30.9, adult: Secondary | ICD-10-CM | POA: Diagnosis not present

## 2018-10-02 NOTE — Progress Notes (Signed)
Diabetes Self-Management Education  Visit Type: First/Initial  Appt. Start Time: 0900 Appt. End Time: 1030  10/02/2018  [name], identified by name and date of birth, is a 73 y.o. male with a diagnosis of Diabetes: Type 2.   ASSESSMENT  Blood pressure 110/74, height 5' 9.5" (1.765 m), weight 209 lb 9.6 oz (95.1 kg). Body mass index is 30.51 kg/m.  Diabetes Self-Management Education - 10/02/18 1405      Visit Information   Visit Type  First/Initial      Initial Visit   Diabetes Type  Type 2    Are you currently following a meal plan?  Yes    What type of meal plan do you follow?  "more fiber, less fat"    Are you taking your medications as prescribed?  Yes    Date Diagnosed  August 2020      Health Coping   How would you rate your overall health?  Good      Psychosocial Assessment   Patient Belief/Attitude about Diabetes  Other (comment)   "no different feelings"   Self-care barriers  None    Self-management support  Doctor's office;Family    Other persons present  Spouse/SO    Patient Concerns  Nutrition/Meal planning;Glycemic Control;Weight Control    Special Needs  None    Preferred Learning Style  Auditory    Learning Readiness  Change in progress    How often do you need to have someone help you when you read instructions, pamphlets, or other written materials from your doctor or pharmacy?  1 - Never    What is the last grade level you completed in school?  BA degree      Pre-Education Assessment   Patient understands the diabetes disease and treatment process.  Needs Instruction    Patient understands incorporating nutritional management into lifestyle.  Needs Instruction    Patient undertands incorporating physical activity into lifestyle.  Needs Review    Patient understands using medications safely.  Needs Instruction    Patient understands monitoring blood glucose, interpreting and using results  Needs Instruction    Patient understands prevention,  detection, and treatment of acute complications.  Needs Instruction    Patient understands prevention, detection, and treatment of chronic complications.  Needs Instruction    Patient understands how to develop strategies to address psychosocial issues.  Needs Instruction    Patient understands how to develop strategies to promote health/change behavior.  Needs Instruction      Complications   Last HgB A1C per patient/outside source  6.5 %   08/23/2018   How often do you check your blood sugar?  0 times/day (not testing)   Provided Contour Next EZ meter and instructed on use. BG upon return demonstration was 166 mg/dL at 10:15 am - 2 hrs pp.   Have you had a dilated eye exam in the past 12 months?  No    Have you had a dental exam in the past 12 months?  Yes    Are you checking your feet?  Yes    How many days per week are you checking your feet?  7      Dietary Intake   Breakfast  eggs or egg whites (omelet) and bacon or sausage and toast; occasional cereal, milk and fruit (blueberries, banana, cantaloupe); occasional bagels    Snack (morning)  0-1 snacks (nuts, popcorn)    Lunch  1/2 sandwich, chips, 1/2 apple; cuccumbers, tomatoes    Dinner  pork, chicken, fish with potatoes, rice, occasional pasta, peas, beans, Borton beans, lettuce, tomatoes carrots, broccoli, asparagus    Beverage(s)  water, unsweetened tea, coffee      Exercise   Exercise Type  Light (walking / raking leaves)    How many days per week to you exercise?  5    How many minutes per day do you exercise?  60    Total minutes per week of exercise  300      Patient Education   Previous Diabetes Education  No    Disease state   Definition of diabetes, type 1 and 2, and the diagnosis of diabetes;Factors that contribute to the development of diabetes    Nutrition management   Role of diet in the treatment of diabetes and the relationship between the three main macronutrients and blood glucose level;Food label reading,  portion sizes and measuring food.;Reviewed blood glucose goals for pre and post meals and how to evaluate the patients' food intake on their blood glucose level.    Physical activity and exercise   Role of exercise on diabetes management, blood pressure control and cardiac health.    Monitoring  Taught/evaluated SMBG meter.;Purpose and frequency of SMBG.;Taught/discussed recording of test results and interpretation of SMBG.;Identified appropriate SMBG and/or A1C goals.    Chronic complications  Relationship between chronic complications and blood glucose control;Retinopathy and reason for yearly dilated eye exams    Psychosocial adjustment  Identified and addressed patients feelings and concerns about diabetes      Individualized Goals (developed by patient)   Reducing Risk Improve blood sugars Lose weight Become more fit     Outcomes   Expected Outcomes  Demonstrated interest in learning. Expect positive outcomes    Program Status  Not Completed       Individualized Plan for Diabetes Self-Management Training:   Learning Objective:  Patient will have a greater understanding of diabetes self-management. Patient education plan is to attend individual and/or group sessions per assessed needs and concerns.   Plan:   Patient Instructions  Check blood sugars before breakfast or 2 hrs after one meal 3 x week Call your doctor for a prescription for:  1. Meter strips (type) Contour Next  checking 3  times per week  2. Lancets (type) Contour Microlet checking 3  times per week Exercise: Continue program for  60  minutes  5 days a week Eat 3 meals day, 1  snacks a day Space meals 4-6 hours apart Add 1 serving of protein with breakfast Make an eye doctor appointment Call back if you want to schedule classes or an appointment with the nurse or dietitian  Expected Outcomes:  Demonstrated interest in learning. Expect positive outcomes  Education material provided:  General Meal Planning  Guidelines Simple Meal Plan Meter = Contour Next EZ  If problems or questions, patient to contact team via:   Xavier Perry, [place], [place], CDE [phone]  Future DSME appointment:  PRN Pt didn't want to return for any further diabetes education at this time. He was offered classes and an individual appointment with the dietitian.

## 2018-10-02 NOTE — Patient Instructions (Signed)
Check blood sugars before breakfast or 2 hrs after one meal 3 x week  Call your doctor for a prescription for:  1. Meter strips (type) Contour Next  checking 3  times per week  2. Lancets (type) Contour Microlet checking 3  times per week  Exercise: Continue program for  60  minutes  5 days a week  Eat 3 meals day, 1  snacks a day Space meals 4-6 hours apart Add 1 serving of protein with breakfast  Make an eye doctor appointment  Call back if you want to schedule classes or an appointment with the nurse or dietitian

## 2018-10-26 DIAGNOSIS — M1712 Unilateral primary osteoarthritis, left knee: Secondary | ICD-10-CM | POA: Diagnosis not present

## 2018-11-10 ENCOUNTER — Other Ambulatory Visit: Payer: Self-pay | Admitting: Family Medicine

## 2018-11-12 ENCOUNTER — Other Ambulatory Visit: Payer: Self-pay | Admitting: Family Medicine

## 2018-11-22 ENCOUNTER — Ambulatory Visit (INDEPENDENT_AMBULATORY_CARE_PROVIDER_SITE_OTHER): Payer: Medicare Other | Admitting: Family Medicine

## 2018-11-22 ENCOUNTER — Other Ambulatory Visit: Payer: Self-pay

## 2018-11-22 ENCOUNTER — Encounter: Payer: Self-pay | Admitting: Family Medicine

## 2018-11-22 VITALS — BP 138/80 | HR 81 | Temp 98.1°F | Ht 69.25 in | Wt 204.4 lb

## 2018-11-22 DIAGNOSIS — E119 Type 2 diabetes mellitus without complications: Secondary | ICD-10-CM | POA: Diagnosis not present

## 2018-11-22 LAB — POCT GLYCOSYLATED HEMOGLOBIN (HGB A1C): Hemoglobin A1C: 6 % — AB (ref 4.0–5.6)

## 2018-11-22 NOTE — Progress Notes (Signed)
Bertin Inabinet T. Andrienne Havener, MD Primary Care and [place] at [facility] [place], [address] Phone: [phone]  FAX: [place] - 73 y.o. male  MRN [id number]  Date of Birth: [birth year]  Visit Date: 11/22/2018  PCP: Owens Loffler, MD  Referred by: Owens Loffler, MD  Chief Complaint  Patient presents with  . Diabetes   Subjective:   Jamicheal Glunz is a 73 y.o. very pleasant male patient who presents with the following:  He is here today for diabetic follow-up, blood pressure follow-up. Grilling a lot.  Stopped the ice caream.  Had been eating a lot   Diabetes Mellitus: Tolerating Medications: yes Compliance with diet: fair, Body mass index is 29.97 kg/m. Exercise: minimal / intermittent Avg blood sugars at home: not checking Foot problems: none Hypoglycemia: none No nausea, vomitting, blurred vision, polyuria.  Lab Results  Component Value Date   HGBA1C 6.0 (A) 11/22/2018   HGBA1C 6.5 08/23/2018   HGBA1C 6.4 08/15/2017   Lab Results  Component Value Date   LDLCALC 62 08/23/2018   CREATININE 0.81 08/23/2018   Body mass index is 29.97 kg/m.   Wt Readings from Last 3 Encounters:  11/22/18 204 lb 7 oz (92.7 kg)  10/02/18 209 lb 9.6 oz (95.1 kg)  09/26/18 213 lb 4 oz (96.7 kg)  lost 10 pounds    Past Medical History, Surgical History, Social History, Family History, Problem List, Medications, and Allergies have been reviewed and updated if relevant.  Patient Active Problem List   Diagnosis Date Noted  . Diet-controlled diabetes mellitus ([place]) 11/22/2018    Priority: High  . TIA (transient ischemic attack) 11/15/2016    Priority: High  . Hypertension 08/29/2017  . Chronic bilateral low back pain with bilateral sciatica 02/16/2017  . Hyperlipidemia LDL goal <70 02/15/2017  . Smoker 08/18/2016  . Osteoarthritis of left knee 01/02/2015  . Abdominal aneurysm without mention of rupture  09/19/2013  . Personal history of colonic adenomas 08/30/2012  . GERD (gastroesophageal reflux disease) 09/19/2011    Past Medical History:  Diagnosis Date  . Basal cell carcinoma   . Chronic bilateral low back pain with bilateral sciatica 02/16/2017  . Diabetes mellitus without complication ([place])   . GERD (gastroesophageal reflux disease) 09/19/2011  . History of repair of rotator cuff 09/19/2011   L 2009 and 2010  . Hypertension 08/29/2017  . Personal history of colonic adenomas 08/30/2012  . Squamous cell carcinoma in situ of skin 09/19/2011  . TIA (transient ischemic attack)     Past Surgical History:  Procedure Laterality Date  . BASAL CELL CARCINOMA EXCISION    . CARPAL TUNNEL RELEASE    . COLONOSCOPY  08-30-2012  . ROTATOR CUFF REPAIR Left 2009 and 2010  . TONSILECTOMY, ADENOIDECTOMY, BILATERAL MYRINGOTOMY AND TUBES  1967  . TOTAL HIP ARTHROPLASTY      Social History   Socioeconomic History  . Marital status: Married    Spouse name: Not on file  . Number of children: Not on file  . Years of education: Not on file  . Highest education level: Not on file  Occupational History  . Not on file  Social Needs  . Financial resource strain: Not on file  . Food insecurity    Worry: Not on file    Inability: Not on file  . Transportation needs    Medical: Not on file    Non-medical: Not on file  Tobacco Use  .  Smoking status: Current Some Day Smoker    Packs/day: 0.50    Years: 50.00    Pack years: 25.00    Types: Cigarettes  . Smokeless tobacco: Never Used  . Tobacco comment: pt will discuss tobacco cessation with PCP  Substance and Sexual Activity  . Alcohol use: Yes    Alcohol/week: 17.0 standard drinks    Types: 14 Shots of liquor, 3 Standard drinks or equivalent per week  . Drug use: No  . Sexual activity: Not Currently  Lifestyle  . Physical activity    Days per week: Not on file    Minutes per session: Not on file  . Stress: Not on file  Relationships  .  Social Herbalist on phone: Not on file    Gets together: Not on file    Attends religious service: Not on file    Active member of club or organization: Not on file    Attends meetings of clubs or organizations: Not on file    Relationship status: Not on file  . Intimate partner violence    Fear of current or ex partner: Not on file    Emotionally abused: Not on file    Physically abused: Not on file    Forced sexual activity: Not on file  Other Topics Concern  . Not on file  Social History Narrative  . Not on file    Family History  Problem Relation Age of Onset  . Varicose Veins Mother   . Cancer Father   . Colon cancer Neg Hx   . Esophageal cancer Neg Hx   . Rectal cancer Neg Hx   . Stomach cancer Neg Hx     No Known Allergies  Medication list reviewed and updated in full in [place].   GEN: No acute illnesses, no fevers, chills. GI: No n/v/d, eating normally Pulm: No SOB Interactive and getting along well at home.  Otherwise, ROS is as per the HPI.  Objective:   BP 138/80   Pulse 81   Temp 98.1 F (36.7 C) (Temporal)   Ht 5' 9.25" (1.759 m)   Wt 204 lb 7 oz (92.7 kg)   SpO2 97%   BMI 29.97 kg/m   GEN: WDWN, NAD, Non-toxic, A & O x 3 HEENT: Atraumatic, Normocephalic. Neck supple. No masses, No LAD. Ears and Nose: No external deformity. CV: RRR, No M/G/R. No JVD. No thrill. No extra heart sounds. EXTR: No c/c/e NEURO Normal gait.  PSYCH: Normally interactive. Conversant. Not depressed or anxious appearing.  Calm demeanor.   Laboratory and Imaging Data: Results for orders placed or performed in visit on 11/22/18  POCT glycosylated hemoglobin (Hb A1C)  Result Value Ref Range   Hemoglobin A1C 6.0 (A) 4.0 - 5.6 %   HbA1c POC (<> result, manual entry)     HbA1c, POC (prediabetic range)     HbA1c, POC (controlled diabetic range)       Assessment and Plan:     ICD-10-CM   1. Diet-controlled diabetes mellitus (HCC)  E11.9 POCT  glycosylated hemoglobin (Hb A1C)   He is really doing great with lifestyle changes. Drastically cut down on ice cream, cookies, etc, and trying to generally eat better.   Goal < 200 pounds.  Follow-up: No follow-ups on file.  No orders of the defined types were placed in this encounter.  Orders Placed This Encounter  Procedures  . POCT glycosylated hemoglobin (Hb A1C)    Signed,  Geofrey Silliman T. Marylene Masek, MD   Outpatient Encounter Medications as of 11/22/2018  Medication Sig  . acetaminophen (TYLENOL) 650 MG CR tablet Take 650 mg by mouth daily.  Marland Kitchen aspirin 81 MG tablet Take 1 tablet (81 mg total) daily by mouth.  . hydrochlorothiazide (HYDRODIURIL) 12.5 MG tablet TAKE 1 TABLET BY MOUTH ONCE DAILY  . naproxen sodium (ALEVE) 220 MG tablet Take 220 mg by mouth 2 (two) times daily as needed.  Marland Kitchen omeprazole (PRILOSEC) 20 MG capsule Take 20 mg by mouth daily as needed.  . pyridOXINE (VITAMIN B-6) 100 MG tablet Take 100 mg by mouth daily.  . rosuvastatin (CRESTOR) 10 MG tablet TAKE 1 TABLET BY MOUTH ONCE DAILY  . sildenafil (REVATIO) 20 MG tablet Generic Revatio / Sildanefil 20 mg. 1 - 5 tabs 30 mins prior to intercourse.   No facility-administered encounter medications on file as of 11/22/2018.

## 2018-12-17 DIAGNOSIS — Z20828 Contact with and (suspected) exposure to other viral communicable diseases: Secondary | ICD-10-CM | POA: Diagnosis not present

## 2018-12-21 ENCOUNTER — Ambulatory Visit: Payer: Medicare Other | Attending: Internal Medicine

## 2018-12-21 ENCOUNTER — Other Ambulatory Visit: Payer: Self-pay

## 2018-12-21 DIAGNOSIS — Z20822 Contact with and (suspected) exposure to covid-19: Secondary | ICD-10-CM

## 2018-12-22 LAB — NOVEL CORONAVIRUS, NAA: SARS-CoV-2, NAA: NOT DETECTED

## 2019-01-31 IMAGING — CT CT HEAD W/O CM
3 series · 15 of 47 positions shown, 18 images · non-contrast
Comparison: None.

CLINICAL DATA: Dizziness and nausea.

EXAM:
CT HEAD WITHOUT CONTRAST
TECHNIQUE: Contiguous axial images were obtained from the base of the skull
through the vertex without intravenous contrast.

[Series 2: head wo · axial · 0.47mm/px · z∈[-186,-51]mm · 9 of 33 slices shown, 12 images]
[im 3/33  brain]
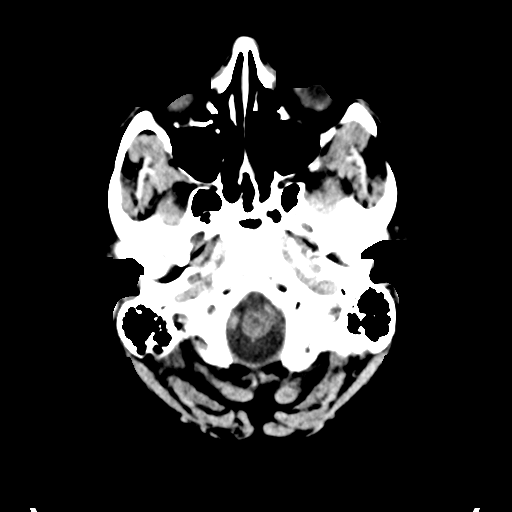
[im 3/33  bone]
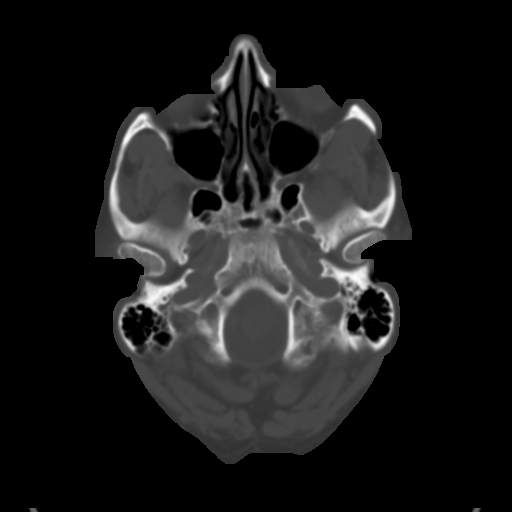
[im 6/33  brain]
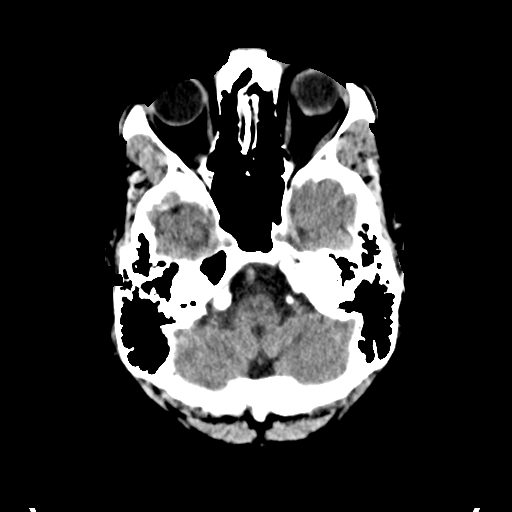
[im 9/33  brain]
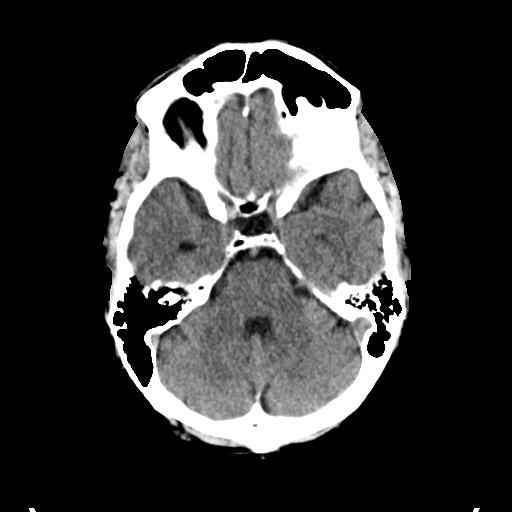
[im 13/33  brain]
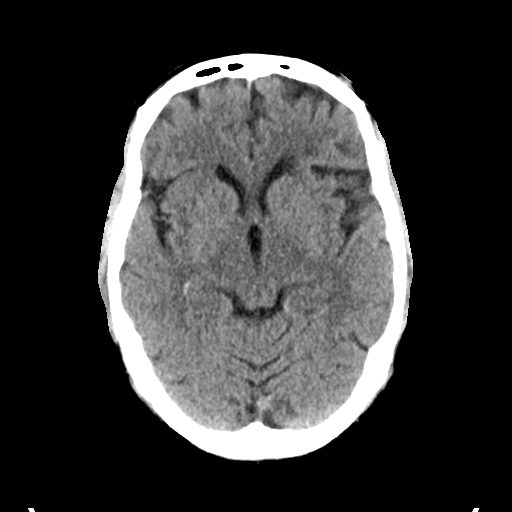
[im 17/33  brain]
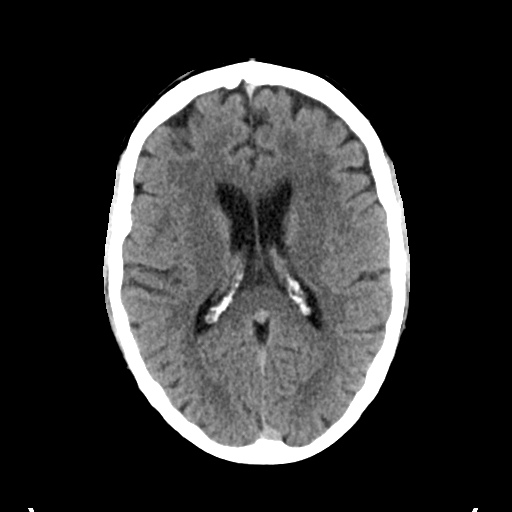
[im 17/33  bone]
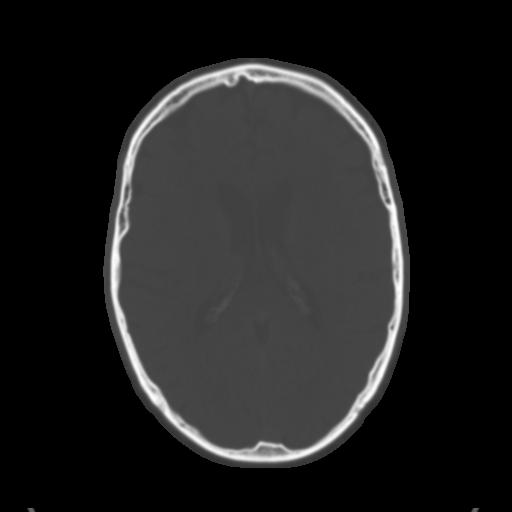
[im 20/33  brain]
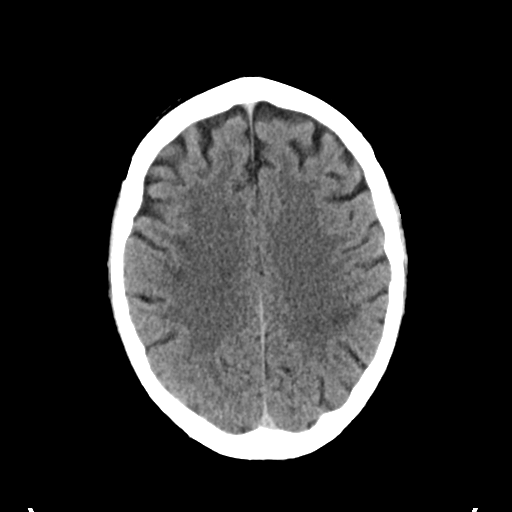
[im 24/33  brain]
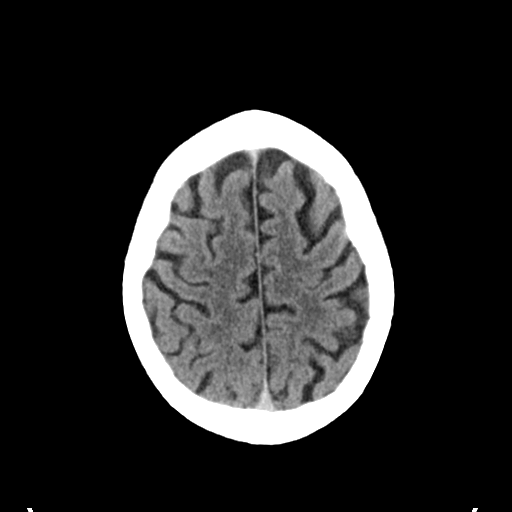
[im 27/33  brain]
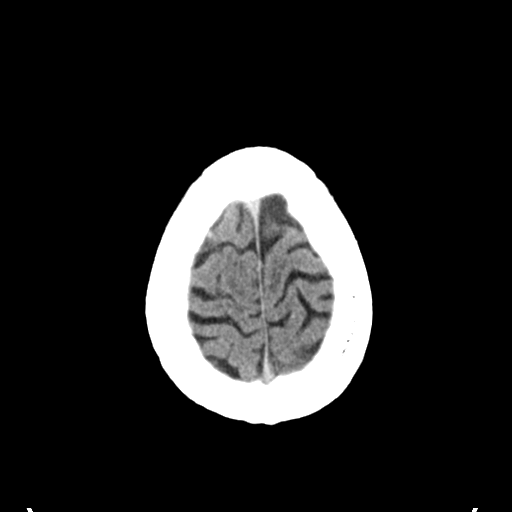
[im 30/33  brain]
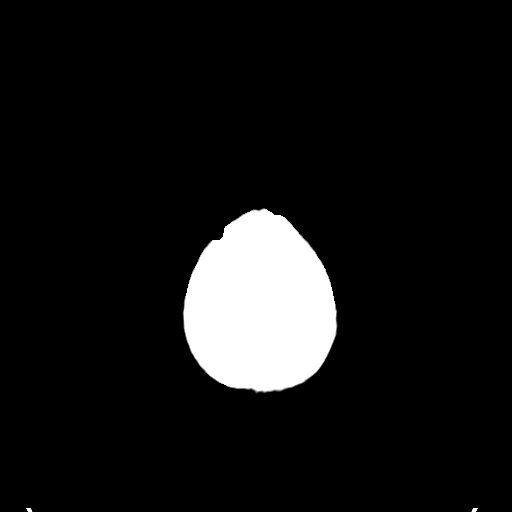
[im 30/33  bone]
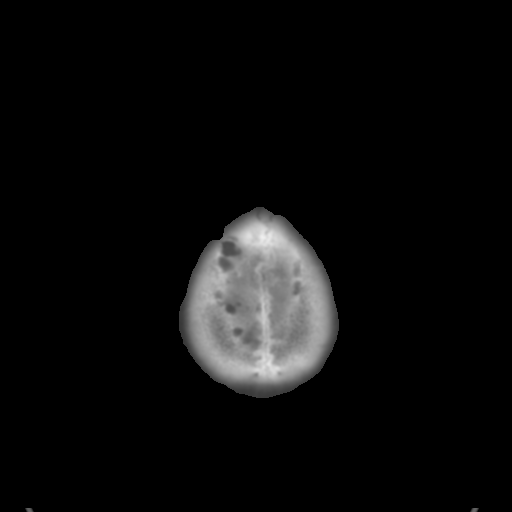

[Series 4: coronal soft tissue · coronal · 0.31mm/px · 3 of 68 slices shown]
[im 23/68  brain]
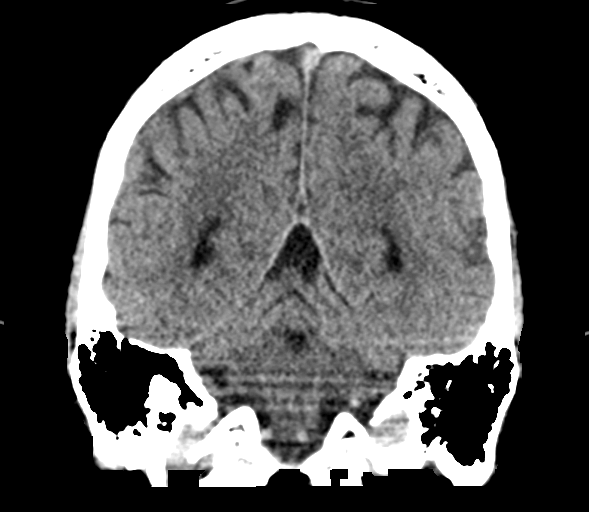
[im 30/68  brain]
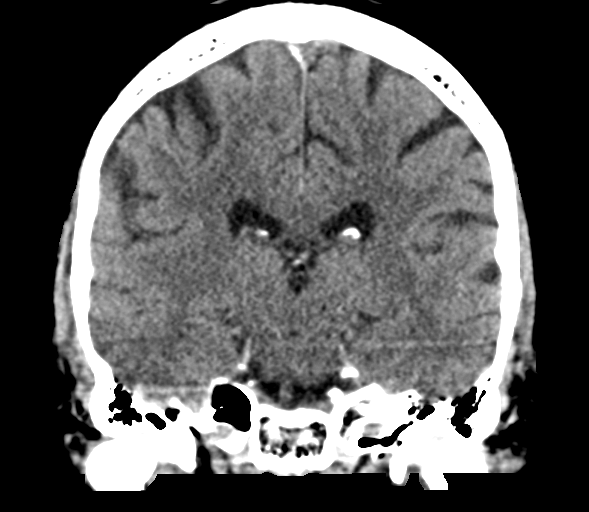
[im 38/68  brain]
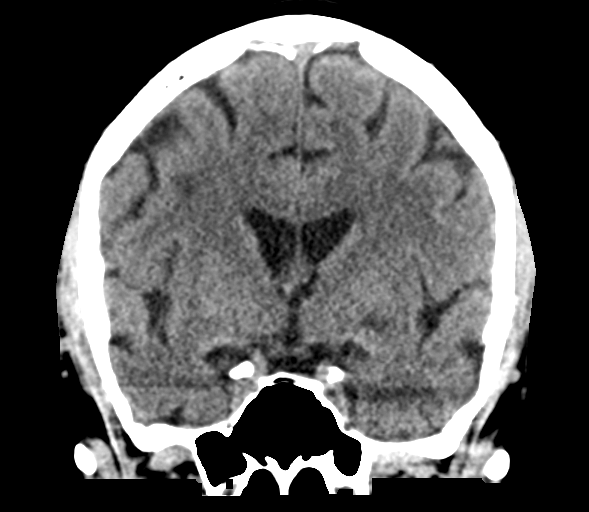

[Series 5: sagittal soft tissue · sagittal · 0.31mm/px · 3 of 56 slices shown]
[im 19/56  brain]
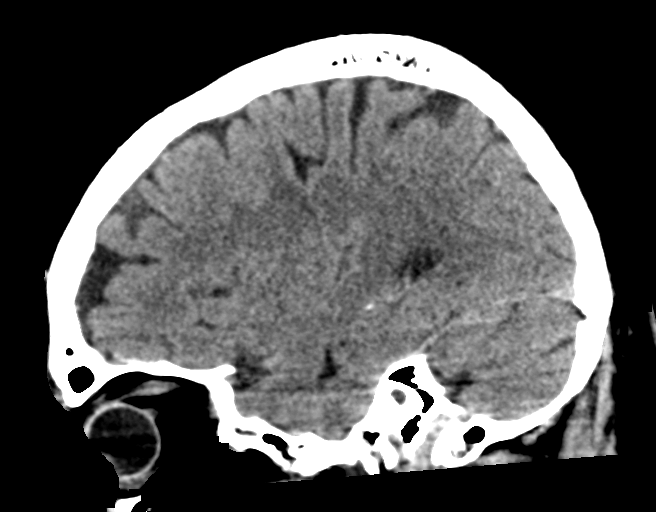
[im 28/56  brain]
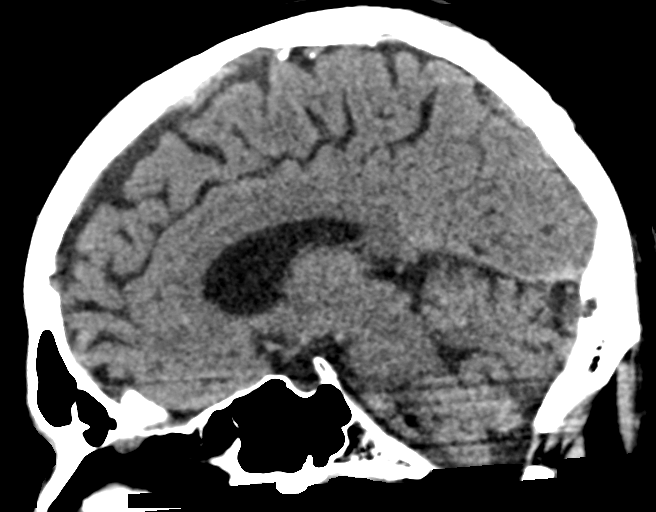
[im 37/56  brain]
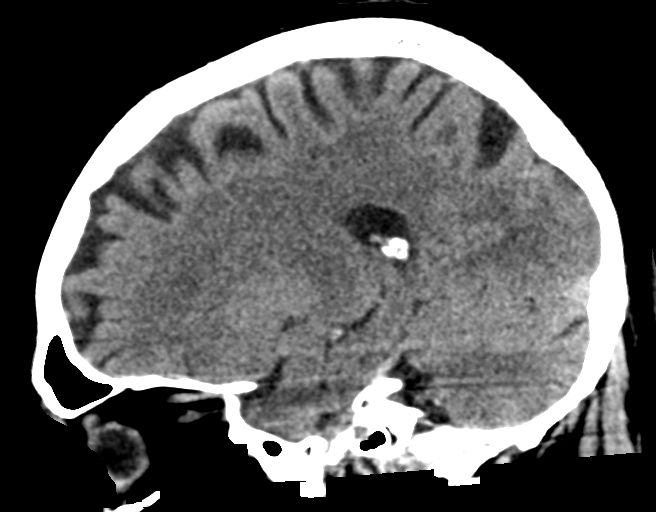

[15 of 47 positions shown; findings below may reference images not displayed]

FINDINGS: Brain: No evidence of acute infarction, hemorrhage, hydrocephalus,
extra-axial collection or mass lesion/mass effect. Mild brain
parenchymal volume loss and periventricular microangiopathy.

Vascular: Calcific atherosclerotic disease at the skullbase.

Skull: Normal. Negative for fracture or focal lesion.

Sinuses/Orbits: No acute finding.

Other: None.
IMPRESSION: No acute intracranial abnormality.

Mild chronic microvascular disease.

## 2019-02-17 ENCOUNTER — Other Ambulatory Visit: Payer: Self-pay | Admitting: Family Medicine

## 2019-03-15 DIAGNOSIS — E119 Type 2 diabetes mellitus without complications: Secondary | ICD-10-CM | POA: Diagnosis not present

## 2019-03-15 LAB — HM DIABETES EYE EXAM

## 2019-03-20 ENCOUNTER — Encounter: Payer: Self-pay | Admitting: Family Medicine

## 2019-03-25 ENCOUNTER — Encounter: Payer: Self-pay | Admitting: Family Medicine

## 2019-03-25 ENCOUNTER — Ambulatory Visit (INDEPENDENT_AMBULATORY_CARE_PROVIDER_SITE_OTHER): Payer: Medicare Other | Admitting: Family Medicine

## 2019-03-25 ENCOUNTER — Other Ambulatory Visit: Payer: Self-pay

## 2019-03-25 VITALS — BP 138/86 | HR 77 | Temp 98.7°F | Ht 69.25 in | Wt 205.5 lb

## 2019-03-25 DIAGNOSIS — M549 Dorsalgia, unspecified: Secondary | ICD-10-CM | POA: Diagnosis not present

## 2019-03-25 DIAGNOSIS — M5416 Radiculopathy, lumbar region: Secondary | ICD-10-CM | POA: Diagnosis not present

## 2019-03-25 NOTE — Progress Notes (Signed)
Milferd Ansell T. Cambreigh Dearing, MD Primary Care and [place] at [facility] [place], [address] Phone: [phone]  FAX: [place] - 74 y.o. male  MRN [id number]  Date of Birth: 07-30-1945  Visit Date: 03/25/2019  PCP: Owens Loffler, MD  Referred by: Owens Loffler, MD  Chief Complaint  Patient presents with  . Leg Pain    Bilateral-back on legs and radates down to calff    This visit occurred during the SARS-CoV-2 public health emergency.  Safety protocols were in place, including screening questions prior to the visit, additional usage of staff PPE, and extensive cleaning of exam room while observing appropriate contact time as indicated for disinfecting solutions.   Subjective:   Xavier Perry is a 74 y.o. very pleasant male patient with Body mass index is 30.13 kg/m. who presents with the following:  Radicular back pain: He has seen Dr. Francisca December many times.  Previously, he thought that much of his back pain was from his back, but ultimately he had a right-sided total hip arthroplasty and his pain subsided.  When he gets up out of the chair.  Has to ease up from a chair. Has been going on for a couple of months.  He occasionally will have some pain down his legs, but he does not have any decreased strength or numbness.  He was able to play 18 holes of golf this morning.  He stopped his crestor. Did not help.  Previously he did have some myalgias on Lipitor. Played some golf today.  Did some things in the yard.  Different than before. Pain from his his, not the back.  No numbness or weakness. Moving helps.  Not something that is there all the time. Down both legs.   ''  Takes some tylnol every day.   Takes 2 ibuprofen.  Wt Readings from Last 3 Encounters:  03/25/19 205 lb 8 oz (93.2 kg)  11/22/18 204 lb 7 oz (92.7 kg)  10/02/18 209 lb 9.6 oz (95.1 kg)    He is globally a very healthy gentleman.  Review  of Systems is noted in the HPI, as appropriate   Objective:   BP 138/86   Pulse 77   Temp 98.7 F (37.1 C) (Temporal)   Ht 5' 9.25" (1.759 m)   Wt 205 lb 8 oz (93.2 kg)   SpO2 99%   BMI 30.13 kg/m   Weight in (lb) to have BMI = 25: 170.2    GEN: No acute distress; alert,appropriate. PULM: Breathing comfortably in no respiratory distress PSYCH: Normally interactive.   Range of motion at  the waist: Flexion: normal Extension: normal Lateral bending: normal Rotation: all normal  No echymosis or edema Rises to examination table with no difficulty Gait: non antalgic  Inspection/Deformity: N Paraspinus Tenderness: Minimal  B Ankle Dorsiflexion (L5,4): 5/5 B Great Toe Dorsiflexion (L5,4): 5/5 Heel Walk (L5): WNL Toe Walk (S1): WNL Rise/Squat (L4): WNL  SENSORY B Medial Foot (L4): WNL B Dorsum (L5): WNL B Lateral (S1): WNL Light Touch: WNL Pinprick: WNL  REFLEXES Knee (L4): 2+ Ankle (S1): 2+  B SLR, seated: neg B SLR, supine: neg B FABER: neg B Reverse FABER: neg B Greater Troch: NT B Log Roll: neg B Stork: NT B Sciatic Notch: NT   Radiology: No results found.  Assessment and Plan:     ICD-10-CM   1. Acute back pain, unspecified back location, unspecified back pain laterality  M54.9   2. Lumbar radiculopathy, acute  M54.16    Acute back pain associated with ongoing intermittent back pain and some occasional radiculopathy.  He is not limited from a back pain standpoint in the sense that he can still be quite active.  Recently, he has been having some difficulty with pain and rising from a seated position.  He is neurovascularly intact throughout.  Begin with some high-dose and anti-inflammatories, basic core rehab and hip range of motion and strengthening.  Patient Instructions  Motrin 800 mg recommended TID. (Over the counter Motrin, Advil, or Generic Ibuprofen 200 mg tablets. 4 tablets by mouth 3 times a day. This equals a prescription strength dose.)   - 3 weeks  If it irritates your stomach, then get some Pepcid AC over the counter.     Follow-up: No follow-ups on file.  No orders of the defined types were placed in this encounter.  There are no discontinued medications. No orders of the defined types were placed in this encounter.   Signed,  Maud Deed. Reyanne Hussar, MD   Outpatient Encounter Medications as of 03/25/2019  Medication Sig  . acetaminophen (TYLENOL) 650 MG CR tablet Take 650 mg by mouth daily.  Marland Kitchen aspirin 81 MG tablet Take 1 tablet (81 mg total) daily by mouth.  . hydrochlorothiazide (HYDRODIURIL) 12.5 MG tablet TAKE 1 TABLET BY MOUTH ONCE DAILY  . naproxen sodium (ALEVE) 220 MG tablet Take 220 mg by mouth 2 (two) times daily as needed.  Marland Kitchen omeprazole (PRILOSEC) 20 MG capsule Take 20 mg by mouth daily as needed.  . pyridOXINE (VITAMIN B-6) 100 MG tablet Take 100 mg by mouth daily.  . rosuvastatin (CRESTOR) 10 MG tablet TAKE 1 TABLET BY MOUTH ONCE DAILY  . sildenafil (REVATIO) 20 MG tablet Generic Revatio / Sildanefil 20 mg. 1 - 5 tabs 30 mins prior to intercourse.   No facility-administered encounter medications on file as of 03/25/2019.

## 2019-03-25 NOTE — Patient Instructions (Signed)
Motrin 800 mg recommended TID. (Over the counter Motrin, Advil, or Generic Ibuprofen 200 mg tablets. 4 tablets by mouth 3 times a day. This equals a prescription strength dose.)  - 3 weeks  If it irritates your stomach, then get some Pepcid AC over the counter.

## 2019-04-05 DIAGNOSIS — M25551 Pain in right hip: Secondary | ICD-10-CM | POA: Diagnosis not present

## 2019-05-09 ENCOUNTER — Other Ambulatory Visit: Payer: Self-pay | Admitting: Family Medicine

## 2019-07-18 DIAGNOSIS — M5432 Sciatica, left side: Secondary | ICD-10-CM | POA: Diagnosis not present

## 2019-07-18 DIAGNOSIS — M5431 Sciatica, right side: Secondary | ICD-10-CM | POA: Diagnosis not present

## 2019-07-23 DIAGNOSIS — M5416 Radiculopathy, lumbar region: Secondary | ICD-10-CM | POA: Diagnosis not present

## 2019-07-23 DIAGNOSIS — M5431 Sciatica, right side: Secondary | ICD-10-CM | POA: Diagnosis not present

## 2019-07-23 DIAGNOSIS — M5126 Other intervertebral disc displacement, lumbar region: Secondary | ICD-10-CM | POA: Diagnosis not present

## 2019-07-23 DIAGNOSIS — M48061 Spinal stenosis, lumbar region without neurogenic claudication: Secondary | ICD-10-CM | POA: Diagnosis not present

## 2019-07-25 DIAGNOSIS — M5416 Radiculopathy, lumbar region: Secondary | ICD-10-CM | POA: Diagnosis not present

## 2019-07-30 DIAGNOSIS — M5416 Radiculopathy, lumbar region: Secondary | ICD-10-CM | POA: Diagnosis not present

## 2019-08-01 DIAGNOSIS — M5416 Radiculopathy, lumbar region: Secondary | ICD-10-CM | POA: Diagnosis not present

## 2019-08-02 DIAGNOSIS — M4316 Spondylolisthesis, lumbar region: Secondary | ICD-10-CM | POA: Diagnosis not present

## 2019-08-02 DIAGNOSIS — M48062 Spinal stenosis, lumbar region with neurogenic claudication: Secondary | ICD-10-CM | POA: Diagnosis not present

## 2019-08-06 ENCOUNTER — Telehealth: Payer: Self-pay

## 2019-08-06 DIAGNOSIS — M5416 Radiculopathy, lumbar region: Secondary | ICD-10-CM | POA: Diagnosis not present

## 2019-08-06 NOTE — Telephone Encounter (Signed)
Pt called wanting to give you a heads up to be on the look out for a form from [facility] in reference to medication mgmt of Aspirin prior to a steroid inj... Just FYI and he would like to be called once the form has been faxed back to [facility]

## 2019-08-09 ENCOUNTER — Telehealth: Payer: Self-pay | Admitting: Family Medicine

## 2019-08-09 DIAGNOSIS — M5416 Radiculopathy, lumbar region: Secondary | ICD-10-CM | POA: Diagnosis not present

## 2019-08-09 NOTE — Telephone Encounter (Signed)
Please schedule MWV with nurse and CPE with Dr. Copland.  °

## 2019-08-16 NOTE — Telephone Encounter (Signed)
Spoke with pt he is on vacation and will call back to schedule

## 2019-08-21 DIAGNOSIS — M5416 Radiculopathy, lumbar region: Secondary | ICD-10-CM | POA: Diagnosis not present

## 2019-09-05 DIAGNOSIS — M4316 Spondylolisthesis, lumbar region: Secondary | ICD-10-CM | POA: Diagnosis not present

## 2019-09-05 DIAGNOSIS — M48062 Spinal stenosis, lumbar region with neurogenic claudication: Secondary | ICD-10-CM | POA: Diagnosis not present

## 2019-09-10 DIAGNOSIS — M5416 Radiculopathy, lumbar region: Secondary | ICD-10-CM | POA: Diagnosis not present

## 2019-09-19 ENCOUNTER — Other Ambulatory Visit: Payer: Self-pay

## 2019-09-19 ENCOUNTER — Ambulatory Visit (INDEPENDENT_AMBULATORY_CARE_PROVIDER_SITE_OTHER): Payer: Medicare Other

## 2019-09-19 DIAGNOSIS — Z23 Encounter for immunization: Secondary | ICD-10-CM

## 2019-09-25 DIAGNOSIS — Z20822 Contact with and (suspected) exposure to covid-19: Secondary | ICD-10-CM | POA: Diagnosis not present

## 2019-09-30 ENCOUNTER — Other Ambulatory Visit: Payer: Self-pay | Admitting: Family Medicine

## 2019-09-30 DIAGNOSIS — Z125 Encounter for screening for malignant neoplasm of prostate: Secondary | ICD-10-CM

## 2019-09-30 DIAGNOSIS — E119 Type 2 diabetes mellitus without complications: Secondary | ICD-10-CM

## 2019-09-30 DIAGNOSIS — E785 Hyperlipidemia, unspecified: Secondary | ICD-10-CM

## 2019-09-30 DIAGNOSIS — Z79899 Other long term (current) drug therapy: Secondary | ICD-10-CM

## 2019-10-08 ENCOUNTER — Other Ambulatory Visit: Payer: Self-pay

## 2019-10-08 ENCOUNTER — Other Ambulatory Visit (INDEPENDENT_AMBULATORY_CARE_PROVIDER_SITE_OTHER): Payer: Medicare Other

## 2019-10-08 DIAGNOSIS — E785 Hyperlipidemia, unspecified: Secondary | ICD-10-CM | POA: Diagnosis not present

## 2019-10-08 DIAGNOSIS — Z79899 Other long term (current) drug therapy: Secondary | ICD-10-CM

## 2019-10-08 DIAGNOSIS — Z125 Encounter for screening for malignant neoplasm of prostate: Secondary | ICD-10-CM | POA: Diagnosis not present

## 2019-10-08 DIAGNOSIS — E119 Type 2 diabetes mellitus without complications: Secondary | ICD-10-CM | POA: Diagnosis not present

## 2019-10-08 LAB — CBC WITH DIFFERENTIAL/PLATELET
Basophils Absolute: 0 10*3/uL (ref 0.0–0.1)
Basophils Relative: 0.4 % (ref 0.0–3.0)
Eosinophils Absolute: 0.3 10*3/uL (ref 0.0–0.7)
Eosinophils Relative: 3.4 % (ref 0.0–5.0)
HCT: 43.9 % (ref 39.0–52.0)
Hemoglobin: 14.8 g/dL (ref 13.0–17.0)
Lymphocytes Relative: 23.1 % (ref 12.0–46.0)
Lymphs Abs: 1.9 10*3/uL (ref 0.7–4.0)
MCHC: 33.7 g/dL (ref 30.0–36.0)
MCV: 94.5 fl (ref 78.0–100.0)
Monocytes Absolute: 0.5 10*3/uL (ref 0.1–1.0)
Monocytes Relative: 6.7 % (ref 3.0–12.0)
Neutro Abs: 5.4 10*3/uL (ref 1.4–7.7)
Neutrophils Relative %: 66.4 % (ref 43.0–77.0)
Platelets: 271 10*3/uL (ref 150.0–400.0)
RBC: 4.64 Mil/uL (ref 4.22–5.81)
RDW: 13.2 % (ref 11.5–15.5)
WBC: 8.1 10*3/uL (ref 4.0–10.5)

## 2019-10-08 LAB — HEPATIC FUNCTION PANEL
ALT: 26 U/L (ref 0–53)
AST: 24 U/L (ref 0–37)
Albumin: 4.3 g/dL (ref 3.5–5.2)
Alkaline Phosphatase: 68 U/L (ref 39–117)
Bilirubin, Direct: 0.1 mg/dL (ref 0.0–0.3)
Total Bilirubin: 0.6 mg/dL (ref 0.2–1.2)
Total Protein: 7 g/dL (ref 6.0–8.3)

## 2019-10-08 LAB — BASIC METABOLIC PANEL
BUN: 14 mg/dL (ref 6–23)
CO2: 30 mEq/L (ref 19–32)
Calcium: 9.5 mg/dL (ref 8.4–10.5)
Chloride: 103 mEq/L (ref 96–112)
Creatinine, Ser: 0.89 mg/dL (ref 0.40–1.50)
GFR: 84.73 mL/min (ref >60.00)
Glucose, Bld: 97 mg/dL (ref 70–99)
Potassium: 4.8 mEq/L (ref 3.5–5.1)
Sodium: 140 mEq/L (ref 135–145)

## 2019-10-08 LAB — HEMOGLOBIN A1C: Hgb A1c MFr Bld: 6.5 % (ref 4.6–6.5)

## 2019-10-08 LAB — LIPID PANEL
Cholesterol: 159 mg/dL (ref 0–200)
HDL: 75.2 mg/dL (ref >39.00)
LDL Cholesterol: 70 mg/dL (ref 0–99)
NonHDL: 84.23
Total CHOL/HDL Ratio: 2
Triglycerides: 73 mg/dL (ref 0.0–149.0)
VLDL: 14.6 mg/dL (ref 0.0–40.0)

## 2019-10-08 LAB — PSA, MEDICARE: PSA: 0.24 ng/ml (ref 0.10–4.00)

## 2019-10-08 LAB — MICROALBUMIN / CREATININE URINE RATIO
Creatinine,U: 129.2 mg/dL
Microalb Creat Ratio: 0.7 mg/g (ref 0.0–30.0)
Microalb, Ur: 0.9 mg/dL (ref 0.0–1.9)

## 2019-10-10 ENCOUNTER — Encounter: Payer: Medicare Other | Admitting: Family Medicine

## 2019-10-16 DIAGNOSIS — Z23 Encounter for immunization: Secondary | ICD-10-CM | POA: Diagnosis not present

## 2019-10-23 ENCOUNTER — Encounter: Payer: Self-pay | Admitting: Family Medicine

## 2019-10-23 ENCOUNTER — Other Ambulatory Visit: Payer: Self-pay

## 2019-10-23 ENCOUNTER — Ambulatory Visit (INDEPENDENT_AMBULATORY_CARE_PROVIDER_SITE_OTHER): Payer: Medicare Other | Admitting: Family Medicine

## 2019-10-23 VITALS — BP 112/82 | HR 73 | Temp 98.0°F | Ht 69.5 in | Wt 202.0 lb

## 2019-10-23 DIAGNOSIS — Z1211 Encounter for screening for malignant neoplasm of colon: Secondary | ICD-10-CM

## 2019-10-23 DIAGNOSIS — M48062 Spinal stenosis, lumbar region with neurogenic claudication: Secondary | ICD-10-CM | POA: Diagnosis not present

## 2019-10-23 DIAGNOSIS — Z0000 Encounter for general adult medical examination without abnormal findings: Secondary | ICD-10-CM | POA: Diagnosis not present

## 2019-10-23 MED ORDER — SILDENAFIL CITRATE 20 MG PO TABS
ORAL_TABLET | ORAL | 11 refills | Status: DC
Start: 2019-10-23 — End: 2021-03-04

## 2019-10-23 MED ORDER — TRAMADOL HCL 50 MG PO TABS
50.0000 mg | ORAL_TABLET | Freq: Three times a day (TID) | ORAL | 3 refills | Status: DC | PRN
Start: 2019-10-23 — End: 2020-06-09

## 2019-10-23 MED ORDER — SILDENAFIL CITRATE 20 MG PO TABS
ORAL_TABLET | ORAL | 11 refills | Status: DC
Start: 2019-10-23 — End: 2019-10-23

## 2019-10-23 NOTE — Progress Notes (Signed)
Hearing Screening   125Hz  250Hz  500Hz  1000Hz  2000Hz  3000Hz  4000Hz  6000Hz  8000Hz   Right ear:           Left ear:           Comments: Has hearing aids. Not wearing them today.  Vision Screening Comments: March 2021

## 2019-10-23 NOTE — Progress Notes (Signed)
Xavier Daniello T. [name], MD, [place] at [facility] [place], [address]  Phone: [phone]   FAX: [phone]  [name] - 74 y.o. male   MRN [id number]   Date of Birth: [birth year]  Date: 10/23/2019   PCP: Owens Loffler, MD   Referral: Owens Loffler, MD  Chief Complaint  Patient presents with   Medicare Wellness   Discuss Options for Pinched Nerve    This visit occurred during the SARS-CoV-2 public health emergency.  Safety protocols were in place, including screening questions prior to the visit, additional usage of staff PPE, and extensive cleaning of exam room while observing appropriate contact time as indicated for disinfecting solutions.   Patient Care Team: Owens Loffler, MD as PCP - General (Family Medicine) Subjective:   Xavier Perry is a 74 y.o. pleasant patient who presents for a medicare wellness examination:  Preventative Health Maintenance Visit:  Health Maintenance Summary Reviewed and updated, unless pt declines services.  Tobacco History Reviewed.  He is still smoking Alcohol: He drinks about 2-3 drinks nightly Exercise Habits: Some activity, rec at least 30 mins 5 times a week, he currently is walking about 3 times a week STD concerns: no risk or activity to increase risk Drug Use: None  COLONOSCOPY -he knows that he is due for 1.  He also talks with me about his spinal stenosis, and he brings in pictures from his MRI.  Also looked at the report.  He has advanced multilevel spinal stenosis at multiple levels as well as foraminal stenosis.  He has seen both spine surgery as well as physical medicine and rehab, and he had excellent relief after 2 epidural steroid injections for greater than 2 months.  Health Maintenance  Topic Date Due   FOOT EXAM  Never done   COLONOSCOPY  11/05/2017   TETANUS/TDAP  08/12/2026 (Originally 08/[birth year]/1965)   OPHTHALMOLOGY  EXAM  03/14/2020   HEMOGLOBIN A1C  04/07/2020   URINE MICROALBUMIN  10/07/2020   INFLUENZA VACCINE  Completed   COVID-19 Vaccine  Completed   Hepatitis C Screening  Completed   PNA vac Low Risk Adult  Completed    Immunization History  Administered Date(s) Administered   Fluad Quad(high Dose 65+) 09/19/2019   Influenza,inj,Quad PF,6+ Mos 11/09/2017, 09/25/2018   Influenza-Unspecified 11/03/2013   PFIZER SARS-COV-2 Vaccination 02/12/2019, 03/05/2019, 10/09/2019   Pneumococcal Conjugate-13 07/25/2013   Pneumococcal Polysaccharide-23 07/18/2012   Zoster 07/18/2012    Patient Active Problem List   Diagnosis Date Noted   Diet-controlled diabetes mellitus ([place]) 11/22/2018    Priority: High   TIA (transient ischemic attack) 11/15/2016    Priority: High   Severe multilevel lumbar spinal stenosis 10/23/2019   Hypertension 08/29/2017   Hyperlipidemia LDL goal <70 02/15/2017   Smoker 08/18/2016   Osteoarthritis of left knee 01/02/2015   Abdominal aneurysm without mention of rupture 09/19/2013   Personal history of colonic adenomas 08/30/2012   GERD (gastroesophageal reflux disease) 09/19/2011    Past Medical History:  Diagnosis Date   Basal cell carcinoma    Chronic bilateral low back pain with bilateral sciatica 02/16/2017   Diabetes mellitus without complication (HCC)    GERD (gastroesophageal reflux disease) 09/19/2011   History of repair of rotator cuff 09/19/2011   L 2009 and 2010   Hypertension 08/29/2017   Personal history of colonic adenomas 08/30/2012   Squamous cell carcinoma in situ of skin 09/19/2011   TIA (  transient ischemic attack)     Past Surgical History:  Procedure Laterality Date   BASAL CELL CARCINOMA EXCISION     CARPAL TUNNEL RELEASE     COLONOSCOPY  08-30-2012   ROTATOR CUFF REPAIR Left 2009 and 2010   TONSILECTOMY, ADENOIDECTOMY, BILATERAL MYRINGOTOMY AND TUBES  1967   TOTAL HIP ARTHROPLASTY      Family History    Problem Relation Age of Onset   Varicose Veins Mother    Cancer Father    Colon cancer Neg Hx    Esophageal cancer Neg Hx    Rectal cancer Neg Hx    Stomach cancer Neg Hx     Past Medical History, Surgical History, Social History, Family History, Problem List, Medications, and Allergies have been reviewed and updated if relevant.  Review of Systems: Pertinent positives are listed above.  Otherwise, a full 14 point review of systems has been done in full and it is negative except where it is noted positive.  Objective:   BP 112/82 (BP Location: Left Arm, Patient Position: Sitting, Cuff Size: Large)    Pulse 73    Temp 98 F (36.7 C)    Ht 5' 9.5" (1.765 m)    Wt 202 lb (91.6 kg)    SpO2 98%    BMI 29.40 kg/m  Fall Risk  10/02/2018 08/27/2018 08/15/2017 08/11/2016 03/30/2015  Falls in the past year? 0 0 No No No   Ideal Body Weight: Weight in (lb) to have BMI = 25: 171.4  Hearing Screening   125Hz  250Hz  500Hz  1000Hz  2000Hz  3000Hz  4000Hz  6000Hz  8000Hz   Right ear:           Left ear:           Comments: Has hearing aids. Not wearing them today.  Vision Screening Comments: March 2021 Depression screen [facility] 2/9 10/02/2018 08/27/2018 08/15/2017 08/11/2016 03/30/2015  Decreased Interest 0 0 0 0 0  Down, Depressed, Hopeless 0 0 0 0 0  PHQ - 2 Score 0 0 0 0 0  Altered sleeping - - 0 - -  Tired, decreased energy - - 0 - -  Change in appetite - - 0 - -  Feeling bad or failure about yourself  - - 0 - -  Trouble concentrating - - 0 - -  Moving slowly or fidgety/restless - - 0 - -  Suicidal thoughts - - 0 - -  PHQ-9 Score - - 0 - -  Difficult doing work/chores - - Not difficult at all - -     GEN: well developed, well nourished, no acute distress Eyes: conjunctiva and lids normal, PERRLA, EOMI ENT: TM clear, nares clear, oral exam WNL Neck: supple, no lymphadenopathy, no thyromegaly, no JVD Pulm: clear to auscultation and percussion, respiratory effort normal CV: regular rate and  rhythm, S1-S2, no murmur, rub or gallop, no bruits, peripheral pulses normal and symmetric, no cyanosis, clubbing, edema or varicosities GI: soft, non-tender; no hepatosplenomegaly, masses; active bowel sounds all quadrants GU: no hernia, testicular mass, penile discharge Lymph: no cervical, axillary or inguinal adenopathy MSK: gait normal, muscle tone and strength WNL, no joint swelling, effusions, discoloration, crepitus  SKIN: clear, good turgor, color WNL, no rashes, lesions, or ulcerations Neuro: normal mental status, normal strength, sensation, and motion Psych: alert; oriented to person, place and time, normally interactive and not anxious or depressed in appearance.  All labs reviewed with patient.  Results for orders placed or performed in visit on 10/08/19  PSA, Medicare  Result Value Ref Range   PSA 0.24 0.10 - 4.00 ng/ml  Basic metabolic panel  Result Value Ref Range   Sodium 140 135 - 145 mEq/L   Potassium 4.8 3.5 - 5.1 mEq/L   Chloride 103 96 - 112 mEq/L   CO2 30 19 - 32 mEq/L   Glucose, Bld 97 70 - 99 mg/dL   BUN 14 6 - 23 mg/dL   Creatinine, Ser 0.89 0.40 - 1.50 mg/dL   GFR 84.73 >60.00 mL/min   Calcium 9.5 8.4 - 10.5 mg/dL  Hepatic function panel  Result Value Ref Range   Total Bilirubin 0.6 0.2 - 1.2 mg/dL   Bilirubin, Direct 0.1 0.0 - 0.3 mg/dL   Alkaline Phosphatase 68 39 - 117 U/L   AST 24 0 - 37 U/L   ALT 26 0 - 53 U/L   Total Protein 7.0 6.0 - 8.3 g/dL   Albumin 4.3 3.5 - 5.2 g/dL  CBC with Differential/Platelet  Result Value Ref Range   WBC 8.1 4.0 - 10.5 K/uL   RBC 4.64 4.22 - 5.81 Mil/uL   Hemoglobin 14.8 13.0 - 17.0 g/dL   HCT 43.9 39 - 52 %   MCV 94.5 78.0 - 100.0 fl   MCHC 33.7 30.0 - 36.0 g/dL   RDW 13.2 11.5 - 15.5 %   Platelets 271.0 150 - 400 K/uL   Neutrophils Relative % 66.4 43 - 77 %   Lymphocytes Relative 23.1 12 - 46 %   Monocytes Relative 6.7 3 - 12 %   Eosinophils Relative 3.4 0 - 5 %   Basophils Relative 0.4 0 - 3 %   Neutro  Abs 5.4 1.4 - 7.7 K/uL   Lymphs Abs 1.9 0.7 - 4.0 K/uL   Monocytes Absolute 0.5 0.1 - 1.0 K/uL   Eosinophils Absolute 0.3 0.0 - 0.7 K/uL   Basophils Absolute 0.0 0.0 - 0.1 K/uL  Microalbumin / creatinine urine ratio  Result Value Ref Range   Microalb, Ur 0.9 0.0 - 1.9 mg/dL   Creatinine,U 129.2 mg/dL   Microalb Creat Ratio 0.7 0.0 - 30.0 mg/g  Hemoglobin A1c  Result Value Ref Range   Hgb A1c MFr Bld 6.5 4.6 - 6.5 %  Lipid panel  Result Value Ref Range   Cholesterol 159 0 - 200 mg/dL   Triglycerides 73.0 0 - 149 mg/dL   HDL 75.20 >39.00 mg/dL   VLDL 14.6 0.0 - 40.0 mg/dL   LDL Cholesterol 70 0 - 99 mg/dL   Total CHOL/HDL Ratio 2    NonHDL 84.23     Assessment and Plan:     ICD-10-CM   1. Healthcare maintenance  Z00.00   2. Screen for colon cancer  Z12.11 Ambulatory referral to Gastroenterology  3. Severe multilevel lumbar spinal stenosis  M48.062    Quit smoking Cut back on drinking  Keep eating well and exercising as tolerated.  Challenging spine case.  I would exhaust all measures prior to thinking about large-scale multilevel fusion.  Health Maintenance Exam: The patient's preventative maintenance and recommended screening tests for an annual wellness exam were reviewed in full today. Brought up to date unless services declined.  Counselled on the importance of diet, exercise, and its role in overall health and mortality. The patient's FH and SH was reviewed, including their home life, tobacco status, and drug and alcohol status.  Follow-up in 1 year for physical exam or additional follow-up below.  I have personally reviewed the Medicare Annual Wellness questionnaire and  have noted 1. The patient's medical and social history 2. Their use of alcohol, tobacco or illicit drugs 3. Their current medications and supplements 4. The patient's functional ability including ADL's, fall risks, home safety risks and hearing or visual             impairment. 5. Diet and  physical activities 6. Evidence for depression or mood disorders 7. Reviewed Updated provider list, see scanned forms and [facility] Snapshot.  8. Reviewed whether or not the patient has HCPOA or living will, and discussed what this means with the patient.  Recommended he bring in a copy for his chart in [facility].  The patients weight, height, BMI and visual acuity have been recorded in the chart I have made referrals, counseling and provided education to the patient based review of the above and I have provided the pt with a written personalized care plan for preventive services.  I have provided the patient with a copy of your personalized plan for preventive services. Instructed to take the time to review along with their updated medication list.  Follow-up: No follow-ups on file. Or follow-up in 1 year if not noted.  No future appointments.  Meds ordered this encounter  Medications   DISCONTD: sildenafil (REVATIO) 20 MG tablet    Sig: Generic Revatio / Sildanefil 20 mg. 1 - 5 tabs 30 mins prior to intercourse.    Dispense:  20 tablet    Refill:  11   traMADol (ULTRAM) 50 MG tablet    Sig: Take 1 tablet (50 mg total) by mouth every 8 (eight) hours as needed for moderate pain.    Dispense:  30 tablet    Refill:  3    refill   sildenafil (REVATIO) 20 MG tablet    Sig: Generic Revatio / Sildanefil 20 mg. 1 - 5 tabs 30 mins prior to intercourse.    Dispense:  20 tablet    Refill:  11   Medications Discontinued During This Encounter  Medication Reason   sildenafil (REVATIO) 20 MG tablet Reorder   sildenafil (REVATIO) 20 MG tablet Reorder   Orders Placed This Encounter  Procedures   Ambulatory referral to Gastroenterology    Signed,  Frederico Hamman T. Logan Baltimore, MD   Allergies as of 10/23/2019   No Known Allergies     Medication List       Accurate as of October 23, 2019  5:53 PM. If you have any questions, ask your nurse or doctor.        acetaminophen 650 MG CR  tablet Commonly known as: TYLENOL Take 650 mg by mouth daily.   aspirin 81 MG tablet Take 1 tablet (81 mg total) daily by mouth.   hydrochlorothiazide 12.5 MG tablet Commonly known as: HYDRODIURIL TAKE 1 TABLET BY MOUTH ONCE DAILY   naproxen sodium 220 MG tablet Commonly known as: ALEVE Take 220 mg by mouth 2 (two) times daily as needed.   omeprazole 20 MG capsule Commonly known as: PRILOSEC Take 20 mg by mouth daily as needed.   pyridOXINE 100 MG tablet Commonly known as: VITAMIN B-6 Take 100 mg by mouth daily.   rosuvastatin 10 MG tablet Commonly known as: CRESTOR TAKE 1 TABLET BY MOUTH ONCE DAILY   sildenafil 20 MG tablet Commonly known as: Revatio Generic Revatio / Sildanefil 20 mg. 1 - 5 tabs 30 mins prior to intercourse.   traMADol 50 MG tablet Commonly known as: ULTRAM Take 1 tablet (50 mg total) by mouth every 8 (eight)  hours as needed for moderate pain. Started by: Owens Loffler, MD

## 2019-11-01 ENCOUNTER — Other Ambulatory Visit: Payer: Self-pay | Admitting: Family Medicine

## 2019-11-16 ENCOUNTER — Other Ambulatory Visit: Payer: Self-pay | Admitting: Family Medicine

## 2019-12-11 DIAGNOSIS — M9903 Segmental and somatic dysfunction of lumbar region: Secondary | ICD-10-CM | POA: Diagnosis not present

## 2019-12-11 DIAGNOSIS — M5432 Sciatica, left side: Secondary | ICD-10-CM | POA: Diagnosis not present

## 2019-12-11 DIAGNOSIS — M9905 Segmental and somatic dysfunction of pelvic region: Secondary | ICD-10-CM | POA: Diagnosis not present

## 2019-12-11 DIAGNOSIS — M5431 Sciatica, right side: Secondary | ICD-10-CM | POA: Diagnosis not present

## 2019-12-16 DIAGNOSIS — M9905 Segmental and somatic dysfunction of pelvic region: Secondary | ICD-10-CM | POA: Diagnosis not present

## 2019-12-16 DIAGNOSIS — M9903 Segmental and somatic dysfunction of lumbar region: Secondary | ICD-10-CM | POA: Diagnosis not present

## 2019-12-16 DIAGNOSIS — M5432 Sciatica, left side: Secondary | ICD-10-CM | POA: Diagnosis not present

## 2019-12-16 DIAGNOSIS — M5431 Sciatica, right side: Secondary | ICD-10-CM | POA: Diagnosis not present

## 2019-12-18 DIAGNOSIS — M9905 Segmental and somatic dysfunction of pelvic region: Secondary | ICD-10-CM | POA: Diagnosis not present

## 2019-12-18 DIAGNOSIS — M5431 Sciatica, right side: Secondary | ICD-10-CM | POA: Diagnosis not present

## 2019-12-18 DIAGNOSIS — M9903 Segmental and somatic dysfunction of lumbar region: Secondary | ICD-10-CM | POA: Diagnosis not present

## 2019-12-18 DIAGNOSIS — M5432 Sciatica, left side: Secondary | ICD-10-CM | POA: Diagnosis not present

## 2019-12-20 DIAGNOSIS — M5432 Sciatica, left side: Secondary | ICD-10-CM | POA: Diagnosis not present

## 2019-12-20 DIAGNOSIS — M9905 Segmental and somatic dysfunction of pelvic region: Secondary | ICD-10-CM | POA: Diagnosis not present

## 2019-12-20 DIAGNOSIS — M5431 Sciatica, right side: Secondary | ICD-10-CM | POA: Diagnosis not present

## 2019-12-20 DIAGNOSIS — M9903 Segmental and somatic dysfunction of lumbar region: Secondary | ICD-10-CM | POA: Diagnosis not present

## 2019-12-23 DIAGNOSIS — M5432 Sciatica, left side: Secondary | ICD-10-CM | POA: Diagnosis not present

## 2019-12-23 DIAGNOSIS — M5431 Sciatica, right side: Secondary | ICD-10-CM | POA: Diagnosis not present

## 2019-12-23 DIAGNOSIS — M9903 Segmental and somatic dysfunction of lumbar region: Secondary | ICD-10-CM | POA: Diagnosis not present

## 2019-12-23 DIAGNOSIS — M9905 Segmental and somatic dysfunction of pelvic region: Secondary | ICD-10-CM | POA: Diagnosis not present

## 2019-12-25 DIAGNOSIS — M5431 Sciatica, right side: Secondary | ICD-10-CM | POA: Diagnosis not present

## 2019-12-25 DIAGNOSIS — M9903 Segmental and somatic dysfunction of lumbar region: Secondary | ICD-10-CM | POA: Diagnosis not present

## 2019-12-25 DIAGNOSIS — M5432 Sciatica, left side: Secondary | ICD-10-CM | POA: Diagnosis not present

## 2019-12-25 DIAGNOSIS — M9905 Segmental and somatic dysfunction of pelvic region: Secondary | ICD-10-CM | POA: Diagnosis not present

## 2019-12-30 DIAGNOSIS — M9903 Segmental and somatic dysfunction of lumbar region: Secondary | ICD-10-CM | POA: Diagnosis not present

## 2019-12-30 DIAGNOSIS — M5431 Sciatica, right side: Secondary | ICD-10-CM | POA: Diagnosis not present

## 2019-12-30 DIAGNOSIS — M5432 Sciatica, left side: Secondary | ICD-10-CM | POA: Diagnosis not present

## 2019-12-30 DIAGNOSIS — M9905 Segmental and somatic dysfunction of pelvic region: Secondary | ICD-10-CM | POA: Diagnosis not present

## 2020-01-01 DIAGNOSIS — M5432 Sciatica, left side: Secondary | ICD-10-CM | POA: Diagnosis not present

## 2020-01-01 DIAGNOSIS — M9905 Segmental and somatic dysfunction of pelvic region: Secondary | ICD-10-CM | POA: Diagnosis not present

## 2020-01-01 DIAGNOSIS — M9903 Segmental and somatic dysfunction of lumbar region: Secondary | ICD-10-CM | POA: Diagnosis not present

## 2020-01-01 DIAGNOSIS — M5431 Sciatica, right side: Secondary | ICD-10-CM | POA: Diagnosis not present

## 2020-01-06 DIAGNOSIS — M9905 Segmental and somatic dysfunction of pelvic region: Secondary | ICD-10-CM | POA: Diagnosis not present

## 2020-01-06 DIAGNOSIS — M5432 Sciatica, left side: Secondary | ICD-10-CM | POA: Diagnosis not present

## 2020-01-06 DIAGNOSIS — M5431 Sciatica, right side: Secondary | ICD-10-CM | POA: Diagnosis not present

## 2020-01-06 DIAGNOSIS — M9903 Segmental and somatic dysfunction of lumbar region: Secondary | ICD-10-CM | POA: Diagnosis not present

## 2020-01-08 DIAGNOSIS — M5432 Sciatica, left side: Secondary | ICD-10-CM | POA: Diagnosis not present

## 2020-01-08 DIAGNOSIS — M5431 Sciatica, right side: Secondary | ICD-10-CM | POA: Diagnosis not present

## 2020-01-08 DIAGNOSIS — M9903 Segmental and somatic dysfunction of lumbar region: Secondary | ICD-10-CM | POA: Diagnosis not present

## 2020-01-08 DIAGNOSIS — M9905 Segmental and somatic dysfunction of pelvic region: Secondary | ICD-10-CM | POA: Diagnosis not present

## 2020-01-10 DIAGNOSIS — M9903 Segmental and somatic dysfunction of lumbar region: Secondary | ICD-10-CM | POA: Diagnosis not present

## 2020-01-10 DIAGNOSIS — M5432 Sciatica, left side: Secondary | ICD-10-CM | POA: Diagnosis not present

## 2020-01-10 DIAGNOSIS — M9905 Segmental and somatic dysfunction of pelvic region: Secondary | ICD-10-CM | POA: Diagnosis not present

## 2020-01-10 DIAGNOSIS — M5431 Sciatica, right side: Secondary | ICD-10-CM | POA: Diagnosis not present

## 2020-01-13 DIAGNOSIS — M9903 Segmental and somatic dysfunction of lumbar region: Secondary | ICD-10-CM | POA: Diagnosis not present

## 2020-01-13 DIAGNOSIS — M5431 Sciatica, right side: Secondary | ICD-10-CM | POA: Diagnosis not present

## 2020-01-13 DIAGNOSIS — M5432 Sciatica, left side: Secondary | ICD-10-CM | POA: Diagnosis not present

## 2020-01-13 DIAGNOSIS — M9905 Segmental and somatic dysfunction of pelvic region: Secondary | ICD-10-CM | POA: Diagnosis not present

## 2020-01-15 DIAGNOSIS — M5432 Sciatica, left side: Secondary | ICD-10-CM | POA: Diagnosis not present

## 2020-01-15 DIAGNOSIS — M9905 Segmental and somatic dysfunction of pelvic region: Secondary | ICD-10-CM | POA: Diagnosis not present

## 2020-01-15 DIAGNOSIS — M9903 Segmental and somatic dysfunction of lumbar region: Secondary | ICD-10-CM | POA: Diagnosis not present

## 2020-01-15 DIAGNOSIS — M5431 Sciatica, right side: Secondary | ICD-10-CM | POA: Diagnosis not present

## 2020-01-22 DIAGNOSIS — M5431 Sciatica, right side: Secondary | ICD-10-CM | POA: Diagnosis not present

## 2020-01-22 DIAGNOSIS — M9905 Segmental and somatic dysfunction of pelvic region: Secondary | ICD-10-CM | POA: Diagnosis not present

## 2020-01-22 DIAGNOSIS — M5432 Sciatica, left side: Secondary | ICD-10-CM | POA: Diagnosis not present

## 2020-01-22 DIAGNOSIS — M9903 Segmental and somatic dysfunction of lumbar region: Secondary | ICD-10-CM | POA: Diagnosis not present

## 2020-01-24 DIAGNOSIS — M5431 Sciatica, right side: Secondary | ICD-10-CM | POA: Diagnosis not present

## 2020-01-24 DIAGNOSIS — M9905 Segmental and somatic dysfunction of pelvic region: Secondary | ICD-10-CM | POA: Diagnosis not present

## 2020-01-24 DIAGNOSIS — M5432 Sciatica, left side: Secondary | ICD-10-CM | POA: Diagnosis not present

## 2020-01-24 DIAGNOSIS — M9903 Segmental and somatic dysfunction of lumbar region: Secondary | ICD-10-CM | POA: Diagnosis not present

## 2020-01-27 DIAGNOSIS — M9905 Segmental and somatic dysfunction of pelvic region: Secondary | ICD-10-CM | POA: Diagnosis not present

## 2020-01-27 DIAGNOSIS — M5432 Sciatica, left side: Secondary | ICD-10-CM | POA: Diagnosis not present

## 2020-01-27 DIAGNOSIS — M5431 Sciatica, right side: Secondary | ICD-10-CM | POA: Diagnosis not present

## 2020-01-27 DIAGNOSIS — M9903 Segmental and somatic dysfunction of lumbar region: Secondary | ICD-10-CM | POA: Diagnosis not present

## 2020-02-09 NOTE — Progress Notes (Signed)
Xavier Spalla T. Loranda Mastel, MD, [place] at [facility] [place], [address]  Phone: [phone]  FAX: [phone]  Xavier Perry - 75 y.o. male  MRN [id number]  Date of Birth: 1945/11/11  Date: 02/10/2020  PCP: Owens Loffler, MD  Referral: Owens Loffler, MD  Chief Complaint  Patient presents with  . Groin Pain    ?Pulled muscle from playing golf last Wednesday    This visit occurred during the SARS-CoV-2 public health emergency.  Safety protocols were in place, including screening questions prior to the visit, additional usage of staff PPE, and extensive cleaning of exam room while observing appropriate contact time as indicated for disinfecting solutions.   Subjective:   Xavier Perry is a 75 y.o. very pleasant male patient with Body mass index is 30.68 kg/m. who presents with the following:  Date of injury February 05, 2020  Xavier Perry is a well-known patient who presents with some groin pain.  Was playing golf and has not played in  A while.  3rd hole.  Felt something in the hip and acute pain.  Putted and then could not finish it up.  His pain is entirely anterior and he has pain with flexing the hip.  Initially had difficulty even getting his pants on.  Since then it is slowly gotten better.  Started wed, and then now able to move it some.  Isolated to the R hip flexor.  Took some tramadol for a couple of days.  Is also taken some NSAIDs as well as some Tylenol.  This is the same side that he had a total hip arthroplasty.  Review of Systems is noted in the HPI, as appropriate   Objective:   BP 100/70   Pulse 73   Temp 98 F (36.7 C) (Temporal)   Ht 5' 9.5" (1.765 m)   Wt 210 lb 12 oz (95.6 kg)   SpO2 97%   BMI 30.68 kg/m   Right hip: Full abduction, and full range of motion with rotational movements with the hip abducted. He does have pain in the anterior aspect of the hip  flexor insertion.  He has no tenderness laterally.  Pain with resisted flexion, none with resisted abduction or abduction.  The contralateral side is not provokable in any way.  Radiology: No results found.  Assessment and Plan:     ICD-10-CM   1. Strain of flexor muscle of right hip, initial encounter  S76.011A    This should do well with conservative care.  He is improving thankfully.  Gave him some rehab from [facility] he had some modified psoas and hip flexor stretching.  We also talked about slowly returning to golf.  Signed,  Maud Deed. Kramer Hanrahan, MD   Outpatient Encounter Medications as of 02/10/2020  Medication Sig  . acetaminophen (TYLENOL) 650 MG CR tablet Take 650 mg by mouth daily.  Marland Kitchen aspirin 81 MG tablet Take 1 tablet (81 mg total) daily by mouth.  . hydrochlorothiazide (HYDRODIURIL) 12.5 MG tablet TAKE 1 TABLET BY MOUTH ONCE DAILY  . naproxen sodium (ALEVE) 220 MG tablet Take 220 mg by mouth 2 (two) times daily as needed.  Marland Kitchen omeprazole (PRILOSEC) 20 MG capsule Take 20 mg by mouth daily as needed.  . pyridOXINE (VITAMIN B-6) 100 MG tablet Take 100 mg by mouth daily.  . rosuvastatin (CRESTOR) 10 MG tablet TAKE 1 TABLET BY MOUTH ONCE DAILY  . sildenafil (REVATIO) 20  MG tablet Generic Revatio / Sildanefil 20 mg. 1 - 5 tabs 30 mins prior to intercourse.  . traMADol (ULTRAM) 50 MG tablet Take 1 tablet (50 mg total) by mouth every 8 (eight) hours as needed for moderate pain.   No facility-administered encounter medications on file as of 02/10/2020.

## 2020-02-10 ENCOUNTER — Other Ambulatory Visit: Payer: Self-pay

## 2020-02-10 ENCOUNTER — Encounter: Payer: Self-pay | Admitting: Family Medicine

## 2020-02-10 ENCOUNTER — Ambulatory Visit (INDEPENDENT_AMBULATORY_CARE_PROVIDER_SITE_OTHER): Payer: Medicare Other | Admitting: Family Medicine

## 2020-02-10 VITALS — BP 100/70 | HR 73 | Temp 98.0°F | Ht 69.5 in | Wt 210.8 lb

## 2020-02-10 DIAGNOSIS — S76011A Strain of muscle, fascia and tendon of right hip, initial encounter: Secondary | ICD-10-CM

## 2020-03-17 DIAGNOSIS — E119 Type 2 diabetes mellitus without complications: Secondary | ICD-10-CM | POA: Diagnosis not present

## 2020-03-17 LAB — HM DIABETES EYE EXAM

## 2020-03-20 ENCOUNTER — Encounter: Payer: Self-pay | Admitting: Family Medicine

## 2020-04-23 DIAGNOSIS — M5416 Radiculopathy, lumbar region: Secondary | ICD-10-CM | POA: Diagnosis not present

## 2020-04-23 DIAGNOSIS — M48061 Spinal stenosis, lumbar region without neurogenic claudication: Secondary | ICD-10-CM | POA: Diagnosis not present

## 2020-05-28 DIAGNOSIS — L570 Actinic keratosis: Secondary | ICD-10-CM | POA: Diagnosis not present

## 2020-05-28 DIAGNOSIS — C44311 Basal cell carcinoma of skin of nose: Secondary | ICD-10-CM | POA: Diagnosis not present

## 2020-05-28 DIAGNOSIS — X32XXXA Exposure to sunlight, initial encounter: Secondary | ICD-10-CM | POA: Diagnosis not present

## 2020-05-28 DIAGNOSIS — C44722 Squamous cell carcinoma of skin of right lower limb, including hip: Secondary | ICD-10-CM | POA: Diagnosis not present

## 2020-05-28 DIAGNOSIS — D485 Neoplasm of uncertain behavior of skin: Secondary | ICD-10-CM | POA: Diagnosis not present

## 2020-06-09 ENCOUNTER — Other Ambulatory Visit: Payer: Self-pay | Admitting: Family Medicine

## 2020-06-09 NOTE — Telephone Encounter (Signed)
Last office visit 02/10/2020 for strain of flexor muscle right hip.  Last refilled 10/23/2019 for #30 with 3 refills.  No future appointments.

## 2020-06-12 DIAGNOSIS — C44722 Squamous cell carcinoma of skin of right lower limb, including hip: Secondary | ICD-10-CM | POA: Diagnosis not present

## 2020-06-25 DIAGNOSIS — M4316 Spondylolisthesis, lumbar region: Secondary | ICD-10-CM | POA: Diagnosis not present

## 2020-06-25 DIAGNOSIS — M48062 Spinal stenosis, lumbar region with neurogenic claudication: Secondary | ICD-10-CM | POA: Diagnosis not present

## 2020-07-02 DIAGNOSIS — R21 Rash and other nonspecific skin eruption: Secondary | ICD-10-CM | POA: Diagnosis not present

## 2020-07-02 DIAGNOSIS — U071 COVID-19: Secondary | ICD-10-CM | POA: Diagnosis not present

## 2020-07-02 DIAGNOSIS — R509 Fever, unspecified: Secondary | ICD-10-CM | POA: Diagnosis not present

## 2020-07-21 DIAGNOSIS — M1612 Unilateral primary osteoarthritis, left hip: Secondary | ICD-10-CM | POA: Diagnosis not present

## 2020-07-29 DIAGNOSIS — M25552 Pain in left hip: Secondary | ICD-10-CM | POA: Diagnosis not present

## 2020-07-29 DIAGNOSIS — E119 Type 2 diabetes mellitus without complications: Secondary | ICD-10-CM | POA: Diagnosis not present

## 2020-08-10 ENCOUNTER — Ambulatory Visit (INDEPENDENT_AMBULATORY_CARE_PROVIDER_SITE_OTHER): Payer: Medicare Other | Admitting: Family Medicine

## 2020-08-10 ENCOUNTER — Other Ambulatory Visit: Payer: Self-pay

## 2020-08-10 ENCOUNTER — Encounter: Payer: Self-pay | Admitting: Family Medicine

## 2020-08-10 VITALS — BP 140/80 | HR 82 | Temp 97.9°F | Ht 69.5 in | Wt 205.0 lb

## 2020-08-10 DIAGNOSIS — M1612 Unilateral primary osteoarthritis, left hip: Secondary | ICD-10-CM | POA: Diagnosis not present

## 2020-08-10 NOTE — Progress Notes (Signed)
Xavier Difrancesco T. Xavier Grupe, MD, [place] at [facility] [place], [address]  Phone: [phone]  FAX: [phone]  Xavier Perry - 75 y.o. male  MRN [id number]  Date of Birth: 07/08/1945  Date: 08/10/2020  PCP: Owens Loffler, MD  Referral: Owens Loffler, MD  Chief Complaint  Patient presents with  . Hip Pain    Left-Had injection 2 weeks with [facility] in [place]-Ineffective    This visit occurred during the SARS-CoV-2 public health emergency.  Safety protocols were in place, including screening questions prior to the visit, additional usage of staff PPE, and extensive cleaning of exam room while observing appropriate contact time as indicated for disinfecting solutions.   Subjective:   Xavier Perry is a 75 y.o. very pleasant male patient with Body mass index is 29.84 kg/m. who presents with the following:  Left hip pain, injection from [employer] 2 weeks ago.  At the time of his office note, the patient's medical records were faxed to our office, and I was able to review with him face-to-face.  He does have end-stage degenerative joint disease of the left hip.  L hip injection - did not help at all.   01/2018 - first hip replacement.  He has done great since then.  Right now, he is having a limp with every step, and hurts to put on his clothing, and he has having a great deal of difficulty playing golf.  Not able to play golf.  None since then.  Cannt deal with the pain.  Has been taking some tylenol in the morning.   One tramadol in the morning.   R leg skin cancer and R mohs surgery is upcoming.   Salonpas patches PT Hip    Review of Systems is noted in the HPI, as appropriate   Objective:   BP 140/80   Pulse 82   Temp 97.9 F (36.6 C) (Temporal)   Ht 5' 9.5" (1.765 m)   Wt 205 lb (93 kg)   SpO2 97%   BMI 29.84 kg/m    HIP EXAM: SIDE: Left ROM: Abduction, Flexion, Internal  and External range of motion: He is able to abduct his hip to 30 degrees, and with the hip flexed at 90 degrees he is only able to achieve approximately 10 degrees of motion with rotational movement. Pain with terminal IROM and EROM: Yes GTB: NT SLR: NEG Knees: No effusion FABER: Unable to fully complete, but induces pain Piriformis: NT at direct palpation Str: flexion: 4-/5 abduction: 5/5 adduction: 5/5 Strength testing non-tender    Radiology: A copy of the AP pelvis was brought to be printed on paper by the patient, and there is obvious severe osteoarthritic changes on the AP pelvis alone on the left.  Assessment and Plan:     ICD-10-CM   1. Localized osteoarthrosis of left hip  M16.12 Ambulatory referral to Physical Therapy     He is planning on moving forward with a left-sided total hip arthroplasty.  He has upcoming follow-up.  For the meantime, continue with Tylenol and tramadol in the morning.  Trial of some Salonpas patches as well.  He may try some CBD Gummies to see if this helps.  Formal physical therapy to help with weakness at the hip and leg, balance, walking, and this will also help from a preoperative standpoint to maximize his recovery.  Medications Discontinued During This Encounter  Medication Reason  . naproxen sodium (ALEVE) 220  MG tablet Completed Course   Orders Placed This Encounter  Procedures  . Ambulatory referral to Physical Therapy    Dragon [employer] One speech-to-text software was used for transcription in this dictation.  Possible transcriptional errors can occur using Editor, commissioning.   Signed,  Maud Deed. Ariez Neilan, MD   Outpatient Encounter Medications as of 08/10/2020  Medication Sig  . acetaminophen (TYLENOL) 650 MG CR tablet Take 650 mg by mouth daily.  Marland Kitchen aspirin 81 MG tablet Take 1 tablet (81 mg total) daily by mouth.  . hydrochlorothiazide (HYDRODIURIL) 12.5 MG tablet TAKE 1 TABLET BY MOUTH ONCE DAILY  . omeprazole (PRILOSEC) 20 MG  capsule Take 20 mg by mouth daily as needed.  . pyridOXINE (VITAMIN B-6) 100 MG tablet Take 100 mg by mouth daily.  . rosuvastatin (CRESTOR) 10 MG tablet TAKE 1 TABLET BY MOUTH ONCE DAILY  . sildenafil (REVATIO) 20 MG tablet Generic Revatio / Sildanefil 20 mg. 1 - 5 tabs 30 mins prior to intercourse.  . traMADol (ULTRAM) 50 MG tablet TAKE ONE TABLET BY MOUTH EVERY 8 HOURS AS NEEDED FOR MODERATE PAIN  . [DISCONTINUED] naproxen sodium (ALEVE) 220 MG tablet Take 220 mg by mouth 2 (two) times daily as needed.   No facility-administered encounter medications on file as of 08/10/2020.

## 2020-08-10 NOTE — Patient Instructions (Signed)
Generic Salonpas patches 4% lidocaine

## 2020-08-13 ENCOUNTER — Telehealth: Payer: Self-pay

## 2020-08-13 NOTE — Telephone Encounter (Signed)
Patient called to follow up on referral to PT. Please advise

## 2020-08-14 NOTE — Telephone Encounter (Signed)
Referred patient to   [facility] in [place], [place] [address] [phone] Fax: [phone]  They can call to schedule or await their call -- they usually review the referral request and then will call the patient to schedule.

## 2020-08-14 NOTE — Telephone Encounter (Signed)
Xavier Perry called in wanted to know about his referral for the PT.

## 2020-08-17 ENCOUNTER — Other Ambulatory Visit: Payer: Self-pay | Admitting: Family Medicine

## 2020-08-17 NOTE — Telephone Encounter (Signed)
Left message to return call to our office.  Wanted to give him number to call office to see about making appointment.

## 2020-08-18 NOTE — Telephone Encounter (Signed)
Called patient he has received call from them and has appointment set up.

## 2020-08-25 DIAGNOSIS — C44311 Basal cell carcinoma of skin of nose: Secondary | ICD-10-CM | POA: Diagnosis not present

## 2020-08-25 DIAGNOSIS — L814 Other melanin hyperpigmentation: Secondary | ICD-10-CM | POA: Diagnosis not present

## 2020-08-25 DIAGNOSIS — L578 Other skin changes due to chronic exposure to nonionizing radiation: Secondary | ICD-10-CM | POA: Diagnosis not present

## 2020-08-25 DIAGNOSIS — L988 Other specified disorders of the skin and subcutaneous tissue: Secondary | ICD-10-CM | POA: Diagnosis not present

## 2020-08-27 DIAGNOSIS — M1612 Unilateral primary osteoarthritis, left hip: Secondary | ICD-10-CM | POA: Diagnosis not present

## 2020-08-31 DIAGNOSIS — M25552 Pain in left hip: Secondary | ICD-10-CM | POA: Diagnosis not present

## 2020-08-31 DIAGNOSIS — R262 Difficulty in walking, not elsewhere classified: Secondary | ICD-10-CM | POA: Diagnosis not present

## 2020-09-02 DIAGNOSIS — M25552 Pain in left hip: Secondary | ICD-10-CM | POA: Diagnosis not present

## 2020-09-02 DIAGNOSIS — R262 Difficulty in walking, not elsewhere classified: Secondary | ICD-10-CM | POA: Diagnosis not present

## 2020-09-09 DIAGNOSIS — R262 Difficulty in walking, not elsewhere classified: Secondary | ICD-10-CM | POA: Diagnosis not present

## 2020-09-09 DIAGNOSIS — M25552 Pain in left hip: Secondary | ICD-10-CM | POA: Diagnosis not present

## 2020-09-11 DIAGNOSIS — R262 Difficulty in walking, not elsewhere classified: Secondary | ICD-10-CM | POA: Diagnosis not present

## 2020-09-11 DIAGNOSIS — M25552 Pain in left hip: Secondary | ICD-10-CM | POA: Diagnosis not present

## 2020-09-22 DIAGNOSIS — M25552 Pain in left hip: Secondary | ICD-10-CM | POA: Diagnosis not present

## 2020-09-22 DIAGNOSIS — R262 Difficulty in walking, not elsewhere classified: Secondary | ICD-10-CM | POA: Diagnosis not present

## 2020-09-24 DIAGNOSIS — R262 Difficulty in walking, not elsewhere classified: Secondary | ICD-10-CM | POA: Diagnosis not present

## 2020-09-24 DIAGNOSIS — M25552 Pain in left hip: Secondary | ICD-10-CM | POA: Diagnosis not present

## 2020-09-29 DIAGNOSIS — R262 Difficulty in walking, not elsewhere classified: Secondary | ICD-10-CM | POA: Diagnosis not present

## 2020-09-29 DIAGNOSIS — M25552 Pain in left hip: Secondary | ICD-10-CM | POA: Diagnosis not present

## 2020-10-02 DIAGNOSIS — M25552 Pain in left hip: Secondary | ICD-10-CM | POA: Diagnosis not present

## 2020-10-02 DIAGNOSIS — R262 Difficulty in walking, not elsewhere classified: Secondary | ICD-10-CM | POA: Diagnosis not present

## 2020-10-04 DIAGNOSIS — Z23 Encounter for immunization: Secondary | ICD-10-CM | POA: Diagnosis not present

## 2020-10-07 DIAGNOSIS — R262 Difficulty in walking, not elsewhere classified: Secondary | ICD-10-CM | POA: Diagnosis not present

## 2020-10-07 DIAGNOSIS — M25552 Pain in left hip: Secondary | ICD-10-CM | POA: Diagnosis not present

## 2020-10-12 ENCOUNTER — Other Ambulatory Visit: Payer: Self-pay | Admitting: Family Medicine

## 2020-10-12 DIAGNOSIS — M25552 Pain in left hip: Secondary | ICD-10-CM | POA: Diagnosis not present

## 2020-10-12 DIAGNOSIS — R262 Difficulty in walking, not elsewhere classified: Secondary | ICD-10-CM | POA: Diagnosis not present

## 2020-10-13 DIAGNOSIS — M1612 Unilateral primary osteoarthritis, left hip: Secondary | ICD-10-CM | POA: Diagnosis not present

## 2020-10-13 DIAGNOSIS — M25552 Pain in left hip: Secondary | ICD-10-CM | POA: Diagnosis not present

## 2020-10-13 DIAGNOSIS — M6281 Muscle weakness (generalized): Secondary | ICD-10-CM | POA: Diagnosis not present

## 2020-11-02 ENCOUNTER — Telehealth: Payer: Self-pay | Admitting: Family Medicine

## 2020-11-02 NOTE — Telephone Encounter (Signed)
Tried to return Kristina's all.  The phone number is to [facility].  I don't know what department/extension Carmell Austria is calling from so I didin't know what option to choice.  If she calls back please let her know that all our record releases are sent to [facility] and it can take 2 to 3 weeks to process.

## 2020-11-02 NOTE — Telephone Encounter (Signed)
Xavier Perry from [facility] wants an update on the medical records request that she faxed on the 26th

## 2020-11-03 HISTORY — PX: TOTAL HIP ARTHROPLASTY: SHX124

## 2020-11-05 ENCOUNTER — Encounter: Payer: Self-pay | Admitting: Family Medicine

## 2020-11-05 ENCOUNTER — Other Ambulatory Visit: Payer: Self-pay | Admitting: Family Medicine

## 2020-11-05 ENCOUNTER — Ambulatory Visit (INDEPENDENT_AMBULATORY_CARE_PROVIDER_SITE_OTHER): Payer: Medicare Other | Admitting: Family Medicine

## 2020-11-05 ENCOUNTER — Other Ambulatory Visit: Payer: Self-pay

## 2020-11-05 VITALS — BP 144/78 | HR 81 | Temp 97.6°F | Ht 69.75 in | Wt 208.0 lb

## 2020-11-05 DIAGNOSIS — E785 Hyperlipidemia, unspecified: Secondary | ICD-10-CM | POA: Diagnosis not present

## 2020-11-05 DIAGNOSIS — E119 Type 2 diabetes mellitus without complications: Secondary | ICD-10-CM

## 2020-11-05 DIAGNOSIS — Z0000 Encounter for general adult medical examination without abnormal findings: Secondary | ICD-10-CM | POA: Diagnosis not present

## 2020-11-05 DIAGNOSIS — Z79899 Other long term (current) drug therapy: Secondary | ICD-10-CM | POA: Diagnosis not present

## 2020-11-05 DIAGNOSIS — Z1211 Encounter for screening for malignant neoplasm of colon: Secondary | ICD-10-CM | POA: Diagnosis not present

## 2020-11-05 DIAGNOSIS — Z125 Encounter for screening for malignant neoplasm of prostate: Secondary | ICD-10-CM

## 2020-11-05 LAB — BASIC METABOLIC PANEL
BUN: 12 mg/dL (ref 6–23)
CO2: 31 mEq/L (ref 19–32)
Calcium: 9.7 mg/dL (ref 8.4–10.5)
Chloride: 100 mEq/L (ref 96–112)
Creatinine, Ser: 0.86 mg/dL (ref 0.40–1.50)
GFR: 85.46 mL/min (ref >60.00)
Glucose, Bld: 116 mg/dL — ABNORMAL HIGH (ref 70–99)
Potassium: 4.4 mEq/L (ref 3.5–5.1)
Sodium: 138 mEq/L (ref 135–145)

## 2020-11-05 LAB — PSA, MEDICARE: PSA: 0.27 ng/ml (ref 0.10–4.00)

## 2020-11-05 LAB — LIPID PANEL
Cholesterol: 172 mg/dL (ref 0–200)
HDL: 85.8 mg/dL
LDL Cholesterol: 66 mg/dL (ref 0–99)
NonHDL: 86.04
Total CHOL/HDL Ratio: 2
Triglycerides: 98 mg/dL (ref 0.0–149.0)
VLDL: 19.6 mg/dL (ref 0.0–40.0)

## 2020-11-05 LAB — CBC WITH DIFFERENTIAL/PLATELET
Basophils Absolute: 0.1 10*3/uL (ref 0.0–0.1)
Basophils Relative: 0.8 % (ref 0.0–3.0)
Eosinophils Absolute: 0.3 10*3/uL (ref 0.0–0.7)
Eosinophils Relative: 3.8 % (ref 0.0–5.0)
HCT: 43.9 % (ref 39.0–52.0)
Hemoglobin: 14.6 g/dL (ref 13.0–17.0)
Lymphocytes Relative: 27.7 % (ref 12.0–46.0)
Lymphs Abs: 1.9 10*3/uL (ref 0.7–4.0)
MCHC: 33.3 g/dL (ref 30.0–36.0)
MCV: 91.9 fl (ref 78.0–100.0)
Monocytes Absolute: 0.6 10*3/uL (ref 0.1–1.0)
Monocytes Relative: 8.6 % (ref 3.0–12.0)
Neutro Abs: 4 10*3/uL (ref 1.4–7.7)
Neutrophils Relative %: 59.1 % (ref 43.0–77.0)
Platelets: 236 10*3/uL (ref 150.0–400.0)
RBC: 4.77 Mil/uL (ref 4.22–5.81)
RDW: 13.3 % (ref 11.5–15.5)
WBC: 6.7 10*3/uL (ref 4.0–10.5)

## 2020-11-05 LAB — HEPATIC FUNCTION PANEL
ALT: 34 U/L (ref 0–53)
AST: 29 U/L (ref 0–37)
Albumin: 4.5 g/dL (ref 3.5–5.2)
Alkaline Phosphatase: 83 U/L (ref 39–117)
Bilirubin, Direct: 0.1 mg/dL (ref 0.0–0.3)
Total Bilirubin: 0.7 mg/dL (ref 0.2–1.2)
Total Protein: 7.5 g/dL (ref 6.0–8.3)

## 2020-11-05 LAB — HEMOGLOBIN A1C: Hgb A1c MFr Bld: 6.1 % (ref 4.6–6.5)

## 2020-11-05 NOTE — Progress Notes (Signed)
Xavier Perry T. Xavier Israelson, MD, [place] at [facility] [place], [address]  Phone: [phone]  FAX: [phone]  Xavier Perry - 75 y.o. male  MRN [id number]  Date of Birth: Jan 18, 1945  Date: 11/05/2020  PCP: Owens Loffler, MD  Referral: Owens Loffler, MD  Chief Complaint  Patient presents with  . Medicare Wellness    This visit occurred during the SARS-CoV-2 public health emergency.  Safety protocols were in place, including screening questions prior to the visit, additional usage of staff PPE, and extensive cleaning of exam room while observing appropriate contact time as indicated for disinfecting solutions.   Patient Care Team: Owens Loffler, MD as PCP - General (Family Medicine) Subjective:   Xavier Perry is a 75 y.o. pleasant patient who presents for a medicare wellness examination:  Preventative Health Maintenance Visit:  Health Maintenance Summary Reviewed and updated, unless pt declines services.  Tobacco History Reviewed. Alcohol: No concerns, no excessive use Exercise Habits: Some activity, rec at least 30 mins 5 times a week STD concerns: no risk or activity to increase risk Drug Use: None  F/u Colon - adenoma  Hip replacement tomorrow  Stopped smoking!  Health Maintenance  Topic Date Due  . FOOT EXAM  Never done  . Zoster Vaccines- Shingrix (1 of 2) Never done  . COLONOSCOPY (Pts 45-64yrs Insurance coverage will need to be confirmed)  11/05/2017  . COVID-19 Vaccine (4 - Booster for [place] series) 12/04/2019  . HEMOGLOBIN A1C  04/07/2020  . URINE MICROALBUMIN  10/07/2020  . TETANUS/TDAP  08/12/2026 (Originally 01/30/2021)  . OPHTHALMOLOGY EXAM  03/17/2021  . Pneumonia Vaccine 17+ Years old  Completed  . INFLUENZA VACCINE  Completed  . Hepatitis C Screening  Completed  . HPV VACCINES  Aged Out    Immunization History  Administered Date(s) Administered  . Fluad Quad(high Dose 65+)  09/19/2019  . Influenza, High Dose Seasonal PF 10/14/2020  . Influenza,inj,Quad PF,6+ Mos 11/09/2017, 09/25/2018  . Influenza-Unspecified 11/03/2013  . PFIZER(Purple Top)SARS-COV-2 Vaccination 02/12/2019, 03/05/2019, 10/09/2019  . Pneumococcal Conjugate-13 07/25/2013  . Pneumococcal Polysaccharide-23 07/18/2012  . Tdap 01/31/2011  . Zoster, Live 07/18/2012    Patient Active Problem List   Diagnosis Date Noted  . Diet-controlled diabetes mellitus ([place]) 11/22/2018    Priority: High  . TIA (transient ischemic attack) 11/15/2016    Priority: High  . Severe multilevel lumbar spinal stenosis 10/23/2019  . Hypertension 08/29/2017  . Hyperlipidemia LDL goal <70 02/15/2017  . Osteoarthritis of left knee 01/02/2015  . Abdominal aneurysm without mention of rupture 09/19/2013  . Personal history of colonic adenomas 08/30/2012  . GERD (gastroesophageal reflux disease) 09/19/2011    Past Medical History:  Diagnosis Date  . Basal cell carcinoma   . Chronic bilateral low back pain with bilateral sciatica 02/16/2017  . Diabetes mellitus without complication ([place])   . GERD (gastroesophageal reflux disease) 09/19/2011  . History of repair of rotator cuff 09/19/2011   L 2009 and 2010  . Hypertension 08/29/2017  . Personal history of colonic adenomas 08/30/2012  . Squamous cell carcinoma in situ of skin 09/19/2011  . TIA (transient ischemic attack)     Past Surgical History:  Procedure Laterality Date  . BASAL CELL CARCINOMA EXCISION    . CARPAL TUNNEL RELEASE    . COLONOSCOPY  08-30-2012  . ROTATOR CUFF REPAIR Left 2009 and 2010  . TONSILECTOMY, ADENOIDECTOMY, BILATERAL MYRINGOTOMY AND TUBES  1967  . TOTAL HIP ARTHROPLASTY  Family History  Problem Relation Age of Onset  . Varicose Veins Mother   . Cancer Father   . Colon cancer Neg Hx   . Esophageal cancer Neg Hx   . Rectal cancer Neg Hx   . Stomach cancer Neg Hx     Past Medical History, Surgical History, Social History, Family  History, Problem List, Medications, and Allergies have been reviewed and updated if relevant.  Review of Systems: Pertinent positives are listed above.  Otherwise, a full 14 point review of systems has been done in full and it is negative except where it is noted positive.  Objective:   BP (!) 144/78   Pulse 81   Temp 97.6 F (36.4 C) (Temporal)   Ht 5' 9.75" (1.772 m)   Wt 208 lb (94.3 kg)   SpO2 98%   BMI 30.06 kg/m  Fall Risk 08/11/2016 08/15/2017 08/27/2018 10/02/2018 11/05/2020  Falls in the past year? No No 0 0 0  Patient Fall Risk Level - - - Low fall risk -   Ideal Body Weight: Weight in (lb) to have BMI = 25: 172.6 Hearing Screening - Comments:: Has bilateral hearing aides but doesn't use them. Vision Screening - Comments:: Eye Exam with Dr. Edison Pace 09/11/2020 Depression screen [facility] 2/9 11/05/2020 10/02/2018 08/27/2018 08/15/2017 08/11/2016  Decreased Interest 0 0 0 0 0  Down, Depressed, Hopeless 0 0 0 0 0  PHQ - 2 Score 0 0 0 0 0  Altered sleeping - - - 0 -  Tired, decreased energy - - - 0 -  Change in appetite - - - 0 -  Feeling bad or failure about yourself  - - - 0 -  Trouble concentrating - - - 0 -  Moving slowly or fidgety/restless - - - 0 -  Suicidal thoughts - - - 0 -  PHQ-9 Score - - - 0 -  Difficult doing work/chores - - - Not difficult at all -     GEN: well developed, well nourished, no acute distress Eyes: conjunctiva and lids normal, PERRLA, EOMI ENT: TM clear, nares clear, oral exam WNL Neck: supple, no lymphadenopathy, no thyromegaly, no JVD Pulm: clear to auscultation and percussion, respiratory effort normal CV: regular rate and rhythm, S1-S2, no murmur, rub or gallop, no bruits, peripheral pulses normal and symmetric, no cyanosis, clubbing, edema or varicosities GI: soft, non-tender; no hepatosplenomegaly, masses; active bowel sounds all quadrants GU: deferred Lymph: no cervical, axillary or inguinal adenopathy MSK: obvious pain with standing and  walking SKIN: clear, good turgor, color WNL, no rashes, lesions, or ulcerations Neuro: normal mental status, normal strength, sensation, and motion Psych: alert; oriented to person, place and time, normally interactive and not anxious or depressed in appearance.  All labs reviewed with patient.   Assessment and Plan:     ICD-10-CM   1. Healthcare maintenance  Z00.00     2. Diet-controlled diabetes mellitus ([place])  E11.9 Hemoglobin T6R    Basic metabolic panel    3. Hyperlipidemia LDL goal <70  E78.5 Lipid panel    4. Screening for malignant neoplasm of prostate  Z12.5 PSA, Medicare ([place])    5. Encounter for long-term (current) use of medications  Z79.899 CBC with Differential/Platelet    Hepatic function panel    6. Screen for colon cancer  Z12.11 Ambulatory referral to Gastroenterology     Doing well - keep off of smoking.  I think he will do well and be more active after his [place]  Maintenance Exam: The patient's preventative maintenance and recommended screening tests for an annual wellness exam were reviewed in full today. Brought up to date unless services declined.  Counselled on the importance of diet, exercise, and its role in overall health and mortality. The patient's FH and SH was reviewed, including their home life, tobacco status, and drug and alcohol status.  Follow-up in 1 year for physical exam or additional follow-up below.  I have personally reviewed the Medicare Annual Wellness questionnaire and have noted 1. The patient's medical and social history 2. Their use of alcohol, tobacco or illicit drugs 3. Their current medications and supplements 4. The patient's functional ability including ADL's, fall risks, home safety risks and hearing or visual             impairment. 5. Diet and physical activities 6. Evidence for depression or mood disorders 7. Reviewed Updated provider list, see scanned forms and [facility] Snapshot.  8. Reviewed whether or not  the patient has HCPOA or living will, and discussed what this means with the patient.  Recommended he bring in a copy for his chart in [facility].  The patients weight, height, BMI and visual acuity have been recorded in the chart I have made referrals, counseling and provided education to the patient based review of the above and I have provided the pt with a written personalized care plan for preventive services.  I have provided the patient with a copy of your personalized plan for preventive services. Instructed to take the time to review along with their updated medication list.  Follow-up: No follow-ups on file. Or follow-up in 1 year if not noted.  No future appointments.  No orders of the defined types were placed in this encounter.  There are no discontinued medications. Orders Placed This Encounter  Procedures  . Hemoglobin A1c  . Basic metabolic panel  . CBC with Differential/Platelet  . Hepatic function panel  . Lipid panel  . PSA, Medicare ([place])  . Ambulatory referral to Gastroenterology    Signed,  Frederico Hamman T. Rashawd Laskaris, MD   Allergies as of 11/05/2020   No Known Allergies      Medication List        Accurate as of November 05, 2020  9:19 AM. If you have any questions, ask your nurse or doctor.          acetaminophen 650 MG CR tablet Commonly known as: TYLENOL Take 650 mg by mouth daily.   aspirin 81 MG tablet Take 1 tablet (81 mg total) daily by mouth.   hydrochlorothiazide 12.5 MG tablet Commonly known as: HYDRODIURIL TAKE ONE TABLET BY MOUTH DAILY   omeprazole 20 MG capsule Commonly known as: PRILOSEC Take 20 mg by mouth daily as needed.   pyridOXINE 100 MG tablet Commonly known as: VITAMIN B-6 Take 100 mg by mouth daily.   rosuvastatin 10 MG tablet Commonly known as: CRESTOR TAKE 1 TABLET BY MOUTH ONCE DAILY   sildenafil 20 MG tablet Commonly known as: Revatio Generic Revatio / Sildanefil 20 mg. 1 - 5 tabs 30 mins prior to  intercourse.   traMADol 50 MG tablet Commonly known as: ULTRAM TAKE ONE TABLET BY MOUTH EVERY 8 HOURS AS NEEDED FOR MODERATE PAIN

## 2020-11-06 DIAGNOSIS — Z87891 Personal history of nicotine dependence: Secondary | ICD-10-CM | POA: Diagnosis not present

## 2020-11-06 DIAGNOSIS — R7303 Prediabetes: Secondary | ICD-10-CM | POA: Diagnosis not present

## 2020-11-06 DIAGNOSIS — E785 Hyperlipidemia, unspecified: Secondary | ICD-10-CM | POA: Diagnosis not present

## 2020-11-06 DIAGNOSIS — Z7982 Long term (current) use of aspirin: Secondary | ICD-10-CM | POA: Diagnosis not present

## 2020-11-06 DIAGNOSIS — M1612 Unilateral primary osteoarthritis, left hip: Secondary | ICD-10-CM | POA: Diagnosis not present

## 2020-11-06 DIAGNOSIS — I10 Essential (primary) hypertension: Secondary | ICD-10-CM | POA: Diagnosis not present

## 2020-11-06 DIAGNOSIS — Z96641 Presence of right artificial hip joint: Secondary | ICD-10-CM | POA: Diagnosis not present

## 2020-11-07 DIAGNOSIS — R7303 Prediabetes: Secondary | ICD-10-CM | POA: Diagnosis not present

## 2020-11-07 DIAGNOSIS — Z96641 Presence of right artificial hip joint: Secondary | ICD-10-CM | POA: Diagnosis not present

## 2020-11-07 DIAGNOSIS — Z87891 Personal history of nicotine dependence: Secondary | ICD-10-CM | POA: Diagnosis not present

## 2020-11-07 DIAGNOSIS — M1612 Unilateral primary osteoarthritis, left hip: Secondary | ICD-10-CM | POA: Diagnosis not present

## 2020-11-07 DIAGNOSIS — Z7982 Long term (current) use of aspirin: Secondary | ICD-10-CM | POA: Diagnosis not present

## 2020-11-10 DIAGNOSIS — M25552 Pain in left hip: Secondary | ICD-10-CM | POA: Diagnosis not present

## 2020-11-10 DIAGNOSIS — M6281 Muscle weakness (generalized): Secondary | ICD-10-CM | POA: Diagnosis not present

## 2020-11-10 DIAGNOSIS — Z96649 Presence of unspecified artificial hip joint: Secondary | ICD-10-CM | POA: Diagnosis not present

## 2020-11-14 ENCOUNTER — Other Ambulatory Visit: Payer: Self-pay | Admitting: Family Medicine

## 2020-11-17 DIAGNOSIS — M25552 Pain in left hip: Secondary | ICD-10-CM | POA: Diagnosis not present

## 2020-11-17 DIAGNOSIS — Z96649 Presence of unspecified artificial hip joint: Secondary | ICD-10-CM | POA: Diagnosis not present

## 2020-11-17 DIAGNOSIS — M6281 Muscle weakness (generalized): Secondary | ICD-10-CM | POA: Diagnosis not present

## 2020-11-19 DIAGNOSIS — Z96649 Presence of unspecified artificial hip joint: Secondary | ICD-10-CM | POA: Diagnosis not present

## 2020-11-19 DIAGNOSIS — M6281 Muscle weakness (generalized): Secondary | ICD-10-CM | POA: Diagnosis not present

## 2020-11-19 DIAGNOSIS — M25552 Pain in left hip: Secondary | ICD-10-CM | POA: Diagnosis not present

## 2020-11-24 DIAGNOSIS — Z96649 Presence of unspecified artificial hip joint: Secondary | ICD-10-CM | POA: Diagnosis not present

## 2020-11-24 DIAGNOSIS — M25552 Pain in left hip: Secondary | ICD-10-CM | POA: Diagnosis not present

## 2020-11-24 DIAGNOSIS — M6281 Muscle weakness (generalized): Secondary | ICD-10-CM | POA: Diagnosis not present

## 2020-12-03 DIAGNOSIS — L570 Actinic keratosis: Secondary | ICD-10-CM | POA: Diagnosis not present

## 2020-12-03 DIAGNOSIS — Z08 Encounter for follow-up examination after completed treatment for malignant neoplasm: Secondary | ICD-10-CM | POA: Diagnosis not present

## 2020-12-03 DIAGNOSIS — Z85828 Personal history of other malignant neoplasm of skin: Secondary | ICD-10-CM | POA: Diagnosis not present

## 2020-12-03 DIAGNOSIS — L853 Xerosis cutis: Secondary | ICD-10-CM | POA: Diagnosis not present

## 2021-01-22 DIAGNOSIS — M5416 Radiculopathy, lumbar region: Secondary | ICD-10-CM | POA: Diagnosis not present

## 2021-01-27 ENCOUNTER — Encounter: Payer: Self-pay | Admitting: Internal Medicine

## 2021-03-04 ENCOUNTER — Other Ambulatory Visit: Payer: Self-pay

## 2021-03-04 ENCOUNTER — Ambulatory Visit (AMBULATORY_SURGERY_CENTER): Payer: Medicare Other | Admitting: *Deleted

## 2021-03-04 VITALS — Ht 69.75 in | Wt 205.0 lb

## 2021-03-04 DIAGNOSIS — Z86010 Personal history of colonic polyps: Secondary | ICD-10-CM

## 2021-03-04 NOTE — Progress Notes (Signed)

## 2021-03-08 DIAGNOSIS — J069 Acute upper respiratory infection, unspecified: Secondary | ICD-10-CM | POA: Diagnosis not present

## 2021-03-11 ENCOUNTER — Telehealth: Payer: Self-pay | Admitting: Internal Medicine

## 2021-03-11 NOTE — Telephone Encounter (Signed)
Patient called today stating ?

## 2021-03-17 ENCOUNTER — Other Ambulatory Visit: Payer: Self-pay | Admitting: Family Medicine

## 2021-03-17 NOTE — Telephone Encounter (Signed)
Refill request Tramadol ?Last office visit 11/05/20 ?Last refill 10/13/20 #30/2 ?

## 2021-03-18 ENCOUNTER — Other Ambulatory Visit: Payer: Self-pay | Admitting: Internal Medicine

## 2021-03-18 ENCOUNTER — Encounter: Payer: Self-pay | Admitting: Internal Medicine

## 2021-03-18 ENCOUNTER — Ambulatory Visit (AMBULATORY_SURGERY_CENTER): Payer: Medicare Other | Admitting: Internal Medicine

## 2021-03-18 VITALS — BP 112/77 | HR 59 | Temp 97.3°F | Resp 15 | Ht 69.75 in | Wt 205.0 lb

## 2021-03-18 DIAGNOSIS — K635 Polyp of colon: Secondary | ICD-10-CM | POA: Diagnosis not present

## 2021-03-18 DIAGNOSIS — Z86010 Personal history of colonic polyps: Secondary | ICD-10-CM

## 2021-03-18 DIAGNOSIS — E119 Type 2 diabetes mellitus without complications: Secondary | ICD-10-CM | POA: Diagnosis not present

## 2021-03-18 DIAGNOSIS — D125 Benign neoplasm of sigmoid colon: Secondary | ICD-10-CM | POA: Diagnosis not present

## 2021-03-18 DIAGNOSIS — I10 Essential (primary) hypertension: Secondary | ICD-10-CM | POA: Diagnosis not present

## 2021-03-18 MED ORDER — SODIUM CHLORIDE 0.9 % IV SOLN: 500.0000 mL | Freq: Once | INTRAVENOUS | Status: DC

## 2021-03-18 NOTE — Patient Instructions (Addendum)
I found and removed one tiny polyp. ? ?I will let you know pathology results and when to have another routine colonoscopy by mail and/or My Chart. ? ?I am thinking you are probably done with routine colonoscopy. ? ?I appreciate the opportunity to care for you. ?Gatha Mayer, MD, [facility] ? ? ?YOU HAD AN ENDOSCOPIC PROCEDURE TODAY AT [facility]:   Refer to the procedure report that was given to you for any specific questions about what was found during the examination.  If the procedure report does not answer your questions, please call your gastroenterologist to clarify.  If you requested that your care partner not be given the details of your procedure findings, then the procedure report has been included in a sealed envelope for you to review at your convenience later. ? ?**Handouts given on polyps and diverticulosis** ? ? ?YOU SHOULD EXPECT: Some feelings of bloating in the abdomen. Passage of more gas than usual.  Walking can help get rid of the air that was put into your GI tract during the procedure and reduce the bloating. If you had a lower endoscopy (such as a colonoscopy or flexible sigmoidoscopy) you may notice spotting of blood in your stool or on the toilet paper. If you underwent a bowel prep for your procedure, you may not have a normal bowel movement for a few days. ? ?Please Note:  You might notice some irritation and congestion in your nose or some drainage.  This is from the oxygen used during your procedure.  There is no need for concern and it should clear up in a day or so. ? ?SYMPTOMS TO REPORT IMMEDIATELY: ? ?Following lower endoscopy (colonoscopy or flexible sigmoidoscopy): ? Excessive amounts of blood in the stool ? Significant tenderness or worsening of abdominal pains ? Swelling of the abdomen that is new, acute ? Fever of 100?F or higher ? ?For urgent or emergent issues, a gastroenterologist can be reached at any hour by calling [phone]. ?Do not use MyChart  messaging for urgent concerns.  ? ? ?DIET:  We do recommend a small meal at first, but then you may proceed to your regular diet.  Drink plenty of fluids but you should avoid alcoholic beverages for 24 hours. ? ?ACTIVITY:  You should plan to take it easy for the rest of today and you should NOT DRIVE or use heavy machinery until tomorrow (because of the sedation medicines used during the test).   ? ?FOLLOW UP: ?Our staff will call the number listed on your records 48-72 hours following your procedure to check on you and address any questions or concerns that you may have regarding the information given to you following your procedure. If we do not reach you, we will leave a message.  We will attempt to reach you two times.  During this call, we will ask if you have developed any symptoms of COVID 19. If you develop any symptoms (ie: fever, flu-like symptoms, shortness of breath, cough etc.) before then, please call [phone].  If you test positive for Covid 19 in the 2 weeks post procedure, please call and report this information to [place].   ? ?If any biopsies were taken you will be contacted by phone or by letter within the next 1-3 weeks.  Please call us at [phone] if you have not heard about the biopsies in 3 weeks.  ? ? ?SIGNATURES/CONFIDENTIALITY: ?You and/or your care partner have signed paperwork which will be entered into your  electronic medical record.  These signatures attest to the fact that that the information above on your After Visit Summary has been reviewed and is understood.  Full responsibility of the confidentiality of this discharge information lies with you and/or your care-partner.  ?

## 2021-03-18 NOTE — Progress Notes (Signed)
PT taken to PACU. Monitors in place. VSS. Report given to RN. 

## 2021-03-18 NOTE — Progress Notes (Signed)
Called to room to assist during endoscopic procedure.  Patient ID and intended procedure confirmed with present staff. Received instructions for my participation in the procedure from the performing physician.  

## 2021-03-18 NOTE — Progress Notes (Signed)
VS completed by DT.  Pt's states no medical or surgical changes since previsit or office visit.  

## 2021-03-18 NOTE — Progress Notes (Signed)
[facility] History and Physical ? ? ?Primary Care Physician:  Owens Loffler, MD ? ? ?Reason for Procedure:   Hx adenomatous colon polyps ? ?Plan:    colonoscopy ? ? ? ? ?HPI: Xavier Perry is a 76 y.o. male w/ hx polyps here for surveillance colonoscopy ? ?08/03/2010 - 24 polyps - many adenomas ([facility]) ?08/30/2012 6 mm sessile serrated adenoma ?11/06/2014 no polyps ?Past Medical History:  ?Diagnosis Date  ? Basal cell carcinoma   ? Chronic bilateral low back pain with bilateral sciatica 02/16/2017  ? Diabetes mellitus without complication ([place])   ? diet control-no meds  ? GERD (gastroesophageal reflux disease) 09/19/2011  ? History of repair of rotator cuff 09/19/2011  ? L 2009 and 2010  ? Hypertension 08/29/2017  ? Personal history of colonic adenomas 08/30/2012  ? Squamous cell carcinoma in situ of skin 09/19/2011  ? TIA (transient ischemic attack) 2017  ? ? ?Past Surgical History:  ?Procedure Laterality Date  ? BASAL CELL CARCINOMA EXCISION    ? CARPAL TUNNEL RELEASE    ? COLONOSCOPY  08/30/2012  ? COLONOSCOPY WITH PROPOFOL  11/06/2014  ? Dr.Wyolene Weimann  ? POLYPECTOMY    ? ROTATOR CUFF REPAIR Left 2009 and 2010  ? TONSILECTOMY, ADENOIDECTOMY, BILATERAL MYRINGOTOMY AND TUBES  1967  ? TOTAL HIP ARTHROPLASTY Right 01/2018  ? TOTAL HIP ARTHROPLASTY Left 11/2020  ? ? ?Prior to Admission medications   ?Medication Sig Start Date End Date Taking? Authorizing Provider  ?acetaminophen (TYLENOL) 650 MG CR tablet Take 650 mg by mouth daily.   Yes [provider]  ?aspirin 81 MG tablet Take 1 tablet (81 mg total) daily by mouth. 11/12/16  Yes Alfred Levins, [place], MD  ?hydrochlorothiazide (HYDRODIURIL) 12.5 MG tablet TAKE ONE TABLET BY MOUTH DAILY 11/15/20  Yes Copland, Frederico Hamman, MD  ?pyridOXINE (VITAMIN B-6) 100 MG tablet Take 100 mg by mouth daily.   Yes [provider]  ?rosuvastatin (CRESTOR) 10 MG tablet TAKE ONE TABLET BY MOUTH DAILY 11/06/20  Yes Copland, Frederico Hamman, MD  ?traMADol (ULTRAM) 50 MG  tablet TAKE ONE TABLET BY MOUTH EVERY 8 HOURS AS NEEDED FOR MODERATE PAIN 10/13/20  Yes Copland, Frederico Hamman, MD  ?omeprazole (PRILOSEC) 20 MG capsule Take 20 mg by mouth daily as needed. ?Patient not taking: Reported on 03/04/2021    [provider]  ? ? ?Current Outpatient Medications  ?Medication Sig Dispense Refill  ? acetaminophen (TYLENOL) 650 MG CR tablet Take 650 mg by mouth daily.    ? aspirin 81 MG tablet Take 1 tablet (81 mg total) daily by mouth. 30 tablet 0  ? hydrochlorothiazide (HYDRODIURIL) 12.5 MG tablet TAKE ONE TABLET BY MOUTH DAILY 90 tablet 3  ? pyridOXINE (VITAMIN B-6) 100 MG tablet Take 100 mg by mouth daily.    ? rosuvastatin (CRESTOR) 10 MG tablet TAKE ONE TABLET BY MOUTH DAILY 90 tablet 3  ? traMADol (ULTRAM) 50 MG tablet TAKE ONE TABLET BY MOUTH EVERY 8 HOURS AS NEEDED FOR MODERATE PAIN 30 tablet 2  ? omeprazole (PRILOSEC) 20 MG capsule Take 20 mg by mouth daily as needed. (Patient not taking: Reported on 03/04/2021)    ? ?Current Facility-Administered Medications  ?Medication Dose Route Frequency Provider Last Rate Last Admin  ? 0.9 %  sodium chloride infusion  500 mL Intravenous Once Gatha Mayer, MD      ? ? ?Allergies as of 03/18/2021  ? (No Known Allergies)  ? ? ?Family History  ?Problem Relation Age of Onset  ? Varicose Veins Mother   ?  Cancer Father   ? Colon cancer Neg Hx   ? Esophageal cancer Neg Hx   ? Rectal cancer Neg Hx   ? Stomach cancer Neg Hx   ? Colon polyps Neg Hx   ? ? ?Social History  ? ?Socioeconomic History  ? Marital status: Married  ?  Spouse name: Not on file  ? Number of children: Not on file  ? Years of education: Not on file  ? Highest education level: Not on file  ?Occupational History  ? Not on file  ?Tobacco Use  ? Smoking status: Some Days  ?  Packs/day: 0.25  ?  Years: 50.00  ?  Pack years: 12.50  ?  Types: Cigarettes  ? Smokeless tobacco: Never  ? Tobacco comments:  ?  pt will discuss tobacco cessation with PCP  ?Vaping Use  ? Vaping Use: Never used   ?Substance and Sexual Activity  ? Alcohol use: Yes  ?  Alcohol/week: 17.0 standard drinks  ?  Types: 14 Shots of liquor, 3 Standard drinks or equivalent per week  ? Drug use: No  ? Sexual activity: Not Currently  ?Other Topics Concern  ? ?Review of Systems: ? ?All other review of systems negative except as mentioned in the HPI. ? ?Physical Exam: ?Vital signs ?BP 121/81   Pulse 79   Temp (!) 97.3 ?F (36.3 ?C) (Temporal)   Ht 5' 9.75" (1.772 m)   Wt 205 lb (93 kg)   SpO2 96%   BMI 29.63 kg/m?  ? ?General:   Alert,  Well-developed, well-nourished, pleasant and cooperative in NAD ?Lungs:  Clear throughout to auscultation.   ?Heart:  Regular rate and rhythm; no murmurs, clicks, rubs,  or gallops. ?Abdomen:  Soft, nontender and nondistended. Normal bowel sounds.   ?Neuro/Psych:  Alert and cooperative. Normal mood and affect. A and O x 3 ? ? ?'@Fiana Gladu'$  Simonne Maffucci, MD, Marval Regal ?[facility] ?[phone] (pager) ?03/18/2021 9:55 AM@ ? ?

## 2021-03-18 NOTE — Op Note (Signed)
[facility] ?Patient Name: Sheffield Hawker ?Procedure Date: 03/18/2021 9:54 AM ?MRN: [id number] ?Endoscopist: Gatha Mayer , MD ?Age: 76 ?Referring MD:  ?Date of Birth: 13-Dec-1945 ?Gender: Male ?Account #: [id number] ?Procedure:                Colonoscopy ?Indications:              Surveillance: Personal history of adenomatous  ?                          polyps on last colonoscopy > 5 years ago ?Medicines:                Propofol per Anesthesia, Monitored Anesthesia Care ?Procedure:                Pre-Anesthesia Assessment: ?                          - Prior to the procedure, a History and Physical  ?                          was performed, and patient medications and  ?                          allergies were reviewed. The patient's tolerance of  ?                          previous anesthesia was also reviewed. The risks  ?                          and benefits of the procedure and the sedation  ?                          options and risks were discussed with the patient.  ?                          All questions were answered, and informed consent  ?                          was obtained. Prior Anticoagulants: The patient has  ?                          taken no previous anticoagulant or antiplatelet  ?                          agents. ASA Grade Assessment: II - A patient with  ?                          mild systemic disease. After reviewing the risks  ?                          and benefits, the patient was deemed in  ?                          satisfactory condition to undergo the procedure. ?  After obtaining informed consent, the colonoscope  ?                          was passed under direct vision. Throughout the  ?                          procedure, the patient's blood pressure, pulse, and  ?                          oxygen saturations were monitored continuously. The  ?                          CF [id number] #[id number] was introduced through the anus  ?                          and  advanced to the the cecum, identified by  ?                          appendiceal orifice and ileocecal valve. The  ?                          colonoscopy was performed without difficulty. The  ?                          patient tolerated the procedure well. The quality  ?                          of the bowel preparation was good. The bowel  ?                          preparation used was Miralax via split dose  ?                          instruction. The ileocecal valve, appendiceal  ?                          orifice, and rectum were photographed. ?Scope In: 10:04:52 AM ?Scope Out: 10:24:15 AM ?Scope Withdrawal Time: 0 hours 17 minutes 46 seconds  ?Total Procedure Duration: 0 hours 19 minutes 23 seconds  ?Findings:                 The perianal and digital rectal examinations were  ?                          normal. ?                          A 3 mm polyp was found in the sigmoid colon. The  ?                          polyp was sessile. The polyp was removed with a  ?                          cold snare. Resection and retrieval were complete.  ?  Verification of patient identification for the  ?                          specimen was done. Estimated blood loss was minimal. ?                          A few diverticula were found in the ascending colon. ?                          The exam was otherwise without abnormality on  ?                          direct and retroflexion views. ?Complications:            No immediate complications. ?Estimated Blood Loss:     Estimated blood loss was minimal. Estimated blood  ?                          loss was minimal. ?Impression:               - One 3 mm polyp in the sigmoid colon, removed with  ?                          a cold snare. Resected and retrieved. ?                          - Diverticulosis in the ascending colon. ?                          - The examination was otherwise normal on direct  ?                          and retroflexion views. ?                           - Personal history of colonic polyps. 08/03/2010 -  ?                          24 polyps - many adenomas ([facility]) ?                          08/30/2012 6 mm sessile serrated adenoma ?                          11/06/2014 no polyps ?Recommendation:           - Patient has a contact number available for  ?                          emergencies. The signs and symptoms of potential  ?                          delayed complications were discussed with the  ?                          patient. Return to normal  activities tomorrow.  ?                          Written discharge instructions were provided to the  ?                          patient. ?                          - Resume previous diet. ?                          - Continue present medications. ?                          - No recommendation at this time regarding repeat  ?                          colonoscopy due to age. ?                          - Await pathology results. ?Gatha Mayer, MD ?03/18/2021 10:34:05 AM ?This report has been signed electronically. ?

## 2021-03-22 ENCOUNTER — Telehealth: Payer: Self-pay

## 2021-03-22 ENCOUNTER — Telehealth: Payer: Self-pay | Admitting: *Deleted

## 2021-03-22 NOTE — Telephone Encounter (Signed)
?  Follow up Call- ? ?Call back number 03/18/2021  ?Post procedure Call Back phone  # [phone]  ?Permission to leave phone message Yes  ?Some recent data might be hidden  ?  ? ?Patient questions: ?Message left to call us if necessary. ?

## 2021-03-22 NOTE — Telephone Encounter (Signed)
?  Follow up Call- ? ?Call back number 03/18/2021  ?Post procedure Call Back phone  # [phone]  ?Permission to leave phone message Yes  ?Some recent data might be hidden  ? First follow up call, LVM ?

## 2021-03-23 DIAGNOSIS — E119 Type 2 diabetes mellitus without complications: Secondary | ICD-10-CM | POA: Diagnosis not present

## 2021-03-23 LAB — HM DIABETES EYE EXAM

## 2021-03-28 ENCOUNTER — Encounter: Payer: Self-pay | Admitting: Internal Medicine

## 2021-05-03 ENCOUNTER — Encounter: Payer: Self-pay | Admitting: Family Medicine

## 2021-07-21 ENCOUNTER — Other Ambulatory Visit: Payer: Self-pay | Admitting: Family Medicine

## 2021-07-21 NOTE — Telephone Encounter (Signed)
Last office visit 11/05/2020 for CPE.  Last refilled 03/18/21 for #30 with 3 refills.  No furure appointments.

## 2021-09-08 DIAGNOSIS — X32XXXA Exposure to sunlight, initial encounter: Secondary | ICD-10-CM | POA: Diagnosis not present

## 2021-09-08 DIAGNOSIS — D485 Neoplasm of uncertain behavior of skin: Secondary | ICD-10-CM | POA: Diagnosis not present

## 2021-09-08 DIAGNOSIS — D2261 Melanocytic nevi of right upper limb, including shoulder: Secondary | ICD-10-CM | POA: Diagnosis not present

## 2021-09-08 DIAGNOSIS — L570 Actinic keratosis: Secondary | ICD-10-CM | POA: Diagnosis not present

## 2021-09-08 DIAGNOSIS — D2271 Melanocytic nevi of right lower limb, including hip: Secondary | ICD-10-CM | POA: Diagnosis not present

## 2021-09-08 DIAGNOSIS — D2262 Melanocytic nevi of left upper limb, including shoulder: Secondary | ICD-10-CM | POA: Diagnosis not present

## 2021-09-08 DIAGNOSIS — Z85828 Personal history of other malignant neoplasm of skin: Secondary | ICD-10-CM | POA: Diagnosis not present

## 2021-10-11 DIAGNOSIS — Z23 Encounter for immunization: Secondary | ICD-10-CM | POA: Diagnosis not present

## 2021-10-20 ENCOUNTER — Other Ambulatory Visit: Payer: Self-pay | Admitting: Family Medicine

## 2021-10-20 DIAGNOSIS — Z79899 Other long term (current) drug therapy: Secondary | ICD-10-CM

## 2021-10-20 DIAGNOSIS — E785 Hyperlipidemia, unspecified: Secondary | ICD-10-CM

## 2021-10-20 DIAGNOSIS — E119 Type 2 diabetes mellitus without complications: Secondary | ICD-10-CM

## 2021-10-20 DIAGNOSIS — Z125 Encounter for screening for malignant neoplasm of prostate: Secondary | ICD-10-CM

## 2021-11-01 ENCOUNTER — Other Ambulatory Visit (INDEPENDENT_AMBULATORY_CARE_PROVIDER_SITE_OTHER): Payer: Medicare Other

## 2021-11-01 DIAGNOSIS — E785 Hyperlipidemia, unspecified: Secondary | ICD-10-CM

## 2021-11-01 DIAGNOSIS — Z125 Encounter for screening for malignant neoplasm of prostate: Secondary | ICD-10-CM | POA: Diagnosis not present

## 2021-11-01 DIAGNOSIS — E119 Type 2 diabetes mellitus without complications: Secondary | ICD-10-CM

## 2021-11-01 DIAGNOSIS — Z79899 Other long term (current) drug therapy: Secondary | ICD-10-CM | POA: Diagnosis not present

## 2021-11-01 LAB — BASIC METABOLIC PANEL
BUN: 11 mg/dL (ref 6–23)
CO2: 28 mEq/L (ref 19–32)
Calcium: 9.3 mg/dL (ref 8.4–10.5)
Chloride: 102 mEq/L (ref 96–112)
Creatinine, Ser: 0.76 mg/dL (ref 0.40–1.50)
GFR: 88.1 mL/min (ref >60.00)
Glucose, Bld: 114 mg/dL — ABNORMAL HIGH (ref 70–99)
Potassium: 4.5 mEq/L (ref 3.5–5.1)
Sodium: 137 mEq/L (ref 135–145)

## 2021-11-01 LAB — CBC WITH DIFFERENTIAL/PLATELET
Basophils Absolute: 0.1 10*3/uL (ref 0.0–0.1)
Basophils Relative: 1.2 % (ref 0.0–3.0)
Eosinophils Absolute: 0.2 10*3/uL (ref 0.0–0.7)
Eosinophils Relative: 3.7 % (ref 0.0–5.0)
HCT: 41.5 % (ref 39.0–52.0)
Hemoglobin: 14 g/dL (ref 13.0–17.0)
Lymphocytes Relative: 30 % (ref 12.0–46.0)
Lymphs Abs: 1.8 10*3/uL (ref 0.7–4.0)
MCHC: 33.7 g/dL (ref 30.0–36.0)
MCV: 93.4 fl (ref 78.0–100.0)
Monocytes Absolute: 0.5 10*3/uL (ref 0.1–1.0)
Monocytes Relative: 9 % (ref 3.0–12.0)
Neutro Abs: 3.4 10*3/uL (ref 1.4–7.7)
Neutrophils Relative %: 56.1 % (ref 43.0–77.0)
Platelets: 227 10*3/uL (ref 150.0–400.0)
RBC: 4.44 Mil/uL (ref 4.22–5.81)
RDW: 12.9 % (ref 11.5–15.5)
WBC: 6 10*3/uL (ref 4.0–10.5)

## 2021-11-01 LAB — MICROALBUMIN / CREATININE URINE RATIO
Creatinine,U: 87.3 mg/dL
Microalb Creat Ratio: 1.2 mg/g (ref 0.0–30.0)
Microalb, Ur: 1 mg/dL (ref 0.0–1.9)

## 2021-11-01 LAB — LIPID PANEL
Cholesterol: 160 mg/dL (ref 0–200)
HDL: 79.7 mg/dL (ref >39.00)
LDL Cholesterol: 60 mg/dL (ref 0–99)
NonHDL: 79.98
Total CHOL/HDL Ratio: 2
Triglycerides: 102 mg/dL (ref 0.0–149.0)
VLDL: 20.4 mg/dL (ref 0.0–40.0)

## 2021-11-01 LAB — HEMOGLOBIN A1C: Hgb A1c MFr Bld: 6.1 % (ref 4.6–6.5)

## 2021-11-01 LAB — HEPATIC FUNCTION PANEL
ALT: 30 U/L (ref 0–53)
AST: 28 U/L (ref 0–37)
Albumin: 4.3 g/dL (ref 3.5–5.2)
Alkaline Phosphatase: 73 U/L (ref 39–117)
Bilirubin, Direct: 0.2 mg/dL (ref 0.0–0.3)
Total Bilirubin: 0.7 mg/dL (ref 0.2–1.2)
Total Protein: 6.8 g/dL (ref 6.0–8.3)

## 2021-11-01 LAB — PSA, MEDICARE: PSA: 0.16 ng/ml (ref 0.10–4.00)

## 2021-11-02 ENCOUNTER — Other Ambulatory Visit: Payer: Self-pay | Admitting: Family Medicine

## 2021-11-08 ENCOUNTER — Ambulatory Visit (INDEPENDENT_AMBULATORY_CARE_PROVIDER_SITE_OTHER): Payer: Medicare Other | Admitting: Family Medicine

## 2021-11-08 ENCOUNTER — Ambulatory Visit (INDEPENDENT_AMBULATORY_CARE_PROVIDER_SITE_OTHER): Payer: Medicare Other | Admitting: *Deleted

## 2021-11-08 ENCOUNTER — Encounter: Payer: Self-pay | Admitting: Family Medicine

## 2021-11-08 VITALS — BP 140/80 | HR 85 | Temp 98.6°F | Ht 69.0 in | Wt 200.2 lb

## 2021-11-08 DIAGNOSIS — I10 Essential (primary) hypertension: Secondary | ICD-10-CM

## 2021-11-08 DIAGNOSIS — Z0000 Encounter for general adult medical examination without abnormal findings: Secondary | ICD-10-CM | POA: Diagnosis not present

## 2021-11-08 DIAGNOSIS — G459 Transient cerebral ischemic attack, unspecified: Secondary | ICD-10-CM | POA: Diagnosis not present

## 2021-11-08 DIAGNOSIS — E119 Type 2 diabetes mellitus without complications: Secondary | ICD-10-CM | POA: Diagnosis not present

## 2021-11-08 DIAGNOSIS — E785 Hyperlipidemia, unspecified: Secondary | ICD-10-CM | POA: Diagnosis not present

## 2021-11-08 NOTE — Progress Notes (Signed)
Subjective:   Xavier Perry is a 76 y.o. male who presents for Medicare Annual/Subsequent preventive examination.  I connected with  Xavier Perry on 11/08/21 by a telephone enabled telemedicine application and verified that I am speaking with the correct person using two identifiers.   I discussed the limitations of evaluation and management by telemedicine. The patient expressed understanding and agreed to proceed.  Patient location: home  Provider location: Tele-Health-home    Review of Systems     Cardiac Risk Factors include: advanced age (>48mn, >>40women);hypertension;male gender;obesity (BMI >30kg/m2)     Objective:    Today's Vitals   There is no height or weight on file to calculate BMI.     11/08/2021    8:33 AM 10/02/2018    9:12 AM 08/15/2017   11:26 AM 11/12/2016    1:48 PM 08/11/2016    2:50 PM 10/23/2014    2:08 PM 10/07/2014    9:18 AM  Advanced Directives  Does Patient Have a Medical Advance Directive? Yes No Yes No Yes Yes No  Type of [place]  [place] will   Copy of [place] Chart? No - copy requested  No - copy requested  No - copy requested    Would patient like information on creating a medical advance directive?  Yes (MAU/Ambulatory/Procedural Areas - Information given)  No - Patient declined   No - patient declined information    Current Medications (verified) Outpatient Encounter Medications as of 11/08/2021  Medication Sig   acetaminophen (TYLENOL) 650 MG CR tablet Take 650 mg by mouth daily.   aspirin 81 MG tablet Take 1 tablet (81 mg total) daily by mouth.   hydrochlorothiazide (HYDRODIURIL) 12.5 MG tablet TAKE ONE TABLET BY MOUTH DAILY   pyridOXINE (VITAMIN B-6) 100 MG tablet Take 100 mg by mouth daily.   rosuvastatin (CRESTOR) 10 MG tablet TAKE ONE TABLET BY MOUTH DAILY    traMADol (ULTRAM) 50 MG tablet TAKE ONE TABLET BY MOUTH EVERY 8 HOURS AS NEEDED FOR MODERATE PAIN   No facility-administered encounter medications on file as of 11/08/2021.    Allergies (verified) Patient has no known allergies.   History: Past Medical History:  Diagnosis Date   Basal cell carcinoma    Chronic bilateral low back pain with bilateral sciatica 02/16/2017   Diabetes mellitus without complication ([facility])    diet control-no meds   GERD (gastroesophageal reflux disease) 09/19/2011   History of repair of rotator cuff 09/19/2011   L 2009 and 2010   Hypertension 08/29/2017   Personal history of colonic adenomas 08/30/2012   Squamous cell carcinoma in situ of skin 09/19/2011   TIA (transient ischemic attack) 2017   Past Surgical History:  Procedure Laterality Date   BASAL CELL CARCINOMA EXCISION     CARPAL TUNNEL RELEASE     COLONOSCOPY  08/30/2012   COLONOSCOPY WITH PROPOFOL  11/06/2014   Dr.Gessner   POLYPECTOMY     ROTATOR CUFF REPAIR Left 2009 and 2010   TONSILECTOMY, ADENOIDECTOMY, BILATERAL MYRINGOTOMY AND TUBES  1967   TOTAL HIP ARTHROPLASTY Right 01/2018   TOTAL HIP ARTHROPLASTY Left 11/2020   Family History  Problem Relation Age of Onset   Varicose Veins Mother    Cancer Father    Colon cancer Neg Hx    Esophageal cancer Neg Hx    Rectal cancer Neg Hx  Stomach cancer Neg Hx    Colon polyps Neg Hx    Social History   Socioeconomic History   Marital status: Married    Spouse name: Not on file   Number of children: Not on file   Years of education: Not on file   Highest education level: Not on file  Occupational History   Not on file  Tobacco Use   Smoking status: Some Days    Packs/day: 0.25    Years: 50.00    Total pack years: 12.50    Types: Cigarettes   Smokeless tobacco: Never   Tobacco comments:    pt will discuss tobacco cessation with PCP  Vaping Use   Vaping Use: Never used  Substance and Sexual Activity   Alcohol use: Yes     Alcohol/week: 17.0 standard drinks of alcohol    Types: 14 Shots of liquor, 3 Standard drinks or equivalent per week   Drug use: No   Sexual activity: Not Currently  Other Topics Concern   Not on file  Social History Narrative   Not on file   Social Determinants of Health   Financial Resource Strain: Low Risk  (11/08/2021)   Overall Financial Resource Strain (CARDIA)    Difficulty of Paying Living Expenses: Not hard at all  Food Insecurity: No Food Insecurity (11/08/2021)   Hunger Vital Sign    Worried About Running Out of Food in the Last Year: Never true    Ran Out of Food in the Last Year: Never true  Transportation Needs: No Transportation Needs (11/08/2021)   PRAPARE - Hydrologist (Medical): No    Perry of Transportation (Non-Medical): No  Physical Activity: Sufficiently Active (11/08/2021)   Exercise Vital Sign    Days of Exercise per Week: 4 days    Minutes of Exercise per Session: 50 min  Stress: No Stress Concern Present (11/08/2021)   [place]    Feeling of Stress : Not at all  Social Connections: Moderately Integrated (11/08/2021)   Social Connection and Isolation Panel [NHANES]    Frequency of Communication with Friends and Family: Twice a week    Frequency of Social Gatherings with Friends and Family: Three times a week    Attends Religious Services: More than 4 times per year    Active Member of Clubs or Organizations: No    Attends Archivist Meetings: Never    Marital Status: Married    Tobacco Counseling Ready to quit: Not Answered Counseling given: Not Answered Tobacco comments: pt will discuss tobacco cessation with PCP   Clinical Intake:  Pre-visit preparation completed: Yes  Pain : No/denies pain        How often do you need to have someone help you when you read instructions, pamphlets, or other written materials from your doctor or  pharmacy?: 1 - Never  Diabetic?  no  Interpreter Needed?: No  Information entered by :: Leroy Kennedy LPN   Activities of Daily Living    11/08/2021    8:34 AM  In your present state of health, do you have any difficulty performing the following activities:  Hearing? 0  Vision? 0  Difficulty concentrating or making decisions? 0  Walking or climbing stairs? 0  Dressing or bathing? 0  Doing errands, shopping? 0  Preparing Food and eating ? N  Using the Toilet? N  In the past six months, have you accidently leaked urine? N  Do you have problems with loss of bowel control? N  Managing your Medications? N  Managing your Finances? N  Housekeeping or managing your Housekeeping? N    Patient Care Team: Owens Loffler, MD as PCP - General (Family Medicine)  Indicate any recent Medical Services you may have received from other than [facility] providers in the past year (date may be approximate).     Assessment:   This is a routine wellness examination for Xavier Perry.  Hearing/Vision screen Hearing Screening - Comments:: No trouble hearing Vision Screening - Comments:: [facility] Up to date  Dietary issues and exercise activities discussed: Current Exercise Habits: Home exercise routine, Type of exercise: walking, Time (Minutes): 45, Frequency (Times/Week): 4, Weekly Exercise (Minutes/Week): 180, Intensity: Mild   Goals Addressed             This Visit's Progress    Patient Stated       Stay active       Depression Screen    11/08/2021    8:38 AM 11/05/2020    8:49 AM 10/02/2018    9:12 AM 08/27/2018    2:56 PM 08/15/2017   11:51 AM 08/11/2016    2:33 PM 03/30/2015    2:45 PM  PHQ 2/9 Scores  PHQ - 2 Score 0 0 0 0 0 0 0  PHQ- 9 Score 0    0      Fall Risk    11/08/2021    8:32 AM 11/05/2020    8:49 AM 10/02/2018    9:12 AM 08/27/2018    2:56 PM 08/15/2017   11:51 AM  Fall Risk   Falls in the past year? 0 0 0 0 No  Number falls in past yr: 0      Injury with Fall? 0       Follow up Falls evaluation completed;Education provided;Falls prevention discussed        FALL RISK PREVENTION PERTAINING TO THE HOME:  Any stairs in or around the home? Yes  If so, are there any without handrails? No  Home free of loose throw rugs in walkways, pet beds, electrical cords, etc? No  Adequate lighting in your home to reduce risk of falls? No   ASSISTIVE DEVICES UTILIZED TO PREVENT FALLS:  Life alert? Yes  Use of a cane, walker or w/c? No  Grab bars in the bathroom? Yes  Shower chair or bench in shower? Yes  Elevated toilet seat or a handicapped toilet? Yes   TIMED UP AND GO:  Was the test performed? No .    Cognitive Function:    08/15/2017   11:51 AM 08/11/2016    2:33 PM  MMSE - Mini Mental State Exam  Orientation to time 5 5  Orientation to Place 5 5  Registration 3 3  Attention/ Calculation 0 0  Recall 2 3  Recall-comments unable to recall 1 of 3 words   Language- name 2 objects 0 0  Language- repeat 1 1  Language- follow 3 step command 3 3  Language- read & follow direction 0 0  Write a sentence 0 0  Copy design 0 0  Total score 19 20        11/08/2021    8:35 AM  6CIT Screen  What Year? 0 points  What month? 0 points  What time? 0 points  Count back from 20 0 points  Months in reverse 0 points  Repeat phrase 0 points  Total Score 0 points    Immunizations  Immunization History  Administered Date(s) Administered   Fluad Quad(high Dose 65+) 09/19/2019, 10/20/2021   Influenza, High Dose Seasonal PF 10/14/2020   Influenza,inj,Quad PF,6+ Mos 11/09/2017, 09/25/2018   Influenza-Unspecified 11/03/2013   PFIZER(Purple Top)SARS-COV-2 Vaccination 02/12/2019, 03/05/2019, 10/09/2019   Pneumococcal Conjugate-13 07/25/2013   Pneumococcal Polysaccharide-23 07/18/2012   Tdap 01/31/2011   Zoster, Live 07/18/2012    TDAP status: Due, Education has been provided regarding the importance of this vaccine. Advised may receive this vaccine at local  pharmacy or Health Dept. Aware to provide a copy of the vaccination record if obtained from local pharmacy or Health Dept. Verbalized acceptance and understanding.  Flu Vaccine status: Up to date  Pneumococcal vaccine status: Up to date  Covid-19 vaccine status: Declined, Education has been provided regarding the importance of this vaccine but patient still declined. Advised may receive this vaccine at local pharmacy or Health Dept.or vaccine clinic. Aware to provide a copy of the vaccination record if obtained from local pharmacy or Health Dept. Verbalized acceptance and understanding.  Qualifies for Shingles Vaccine? Yes   Zostavax completed No   Shingrix Completed?: No.    Education has been provided regarding the importance of this vaccine. Patient has been advised to call insurance company to determine out of pocket expense if they have not yet received this vaccine. Advised may also receive vaccine at local pharmacy or Health Dept. Verbalized acceptance and understanding.  Screening Tests Health Maintenance  Topic Date Due   FOOT EXAM  Never done   COVID-19 Vaccine (4 - Pfizer risk series) 11/24/2021 (Originally 12/04/2019)   Zoster Vaccines- Shingrix (1 of 2) 02/08/2022 (Originally 08/09/1964)   TETANUS/TDAP  08/12/2026 (Originally 01/30/2021)   OPHTHALMOLOGY EXAM  03/24/2022   HEMOGLOBIN A1C  05/03/2022   Diabetic kidney evaluation - GFR measurement  11/02/2022   Diabetic kidney evaluation - Urine ACR  11/02/2022   Medicare Annual Wellness (AWV)  11/09/2022   Pneumonia Vaccine 97+ Years old  Completed   INFLUENZA VACCINE  Completed   Hepatitis C Screening  Completed   HPV VACCINES  Aged Out   COLONOSCOPY (Pts 45-21yr Insurance coverage will need to be confirmed)  Discontinued    Health Maintenance  Health Maintenance Due  Topic Date Due   FOOT EXAM  Never done    Colorectal cancer screening: No longer required.   Lung Cancer Screening: (Low Dose CT Chest recommended if  Age 75-80years, 30 pack-year currently smoking OR have quit w/in 15years.) does not qualify.   Lung Cancer Screening Referral:   Additional Screening:  Hepatitis C Screening: does not qualify; Completed 2018  Vision Screening: Recommended annual ophthalmology exams for early detection of glaucoma and other disorders of the eye. Is the patient up to date with their annual eye exam?  Yes  Who is the provider or what is the name of the office in which the patient attends annual eye exams? [facility] pt is not established with a provider, would they like to be referred to a provider to establish care? No .   Dental Screening: Recommended annual dental exams for proper oral hygiene  Community Resource Referral / Chronic Care Management: CRR required this visit?  No   CCM required this visit?  No      Plan:     I have personally reviewed and noted the following in the patient's chart:   Medical and social history Use of alcohol, tobacco or illicit drugs  Current medications and supplements including opioid prescriptions. Patient is  not currently taking opioid prescriptions. Functional ability and status Nutritional status Physical activity Advanced directives List of other physicians Hospitalizations, surgeries, and ER visits in previous 12 months Vitals Screenings to include cognitive, depression, and falls Referrals and appointments  In addition, I have reviewed and discussed with patient certain preventive protocols, quality metrics, and best practice recommendations. A written personalized care plan for preventive services as well as general preventive health recommendations were provided to patient.     Leroy Kennedy, LPN   12/11/473   Nurse Notes:

## 2021-11-08 NOTE — Progress Notes (Unsigned)
Xavier Abrigo T. Doneen Ollinger, MD, [place] at [facility] [place], [address]  Phone: [phone]  FAX: [phone]  Xavier Perry - 76 y.o. male  MRN [id number]  Date of Birth: 1945-01-10  Date: 11/08/2021  PCP: Owens Loffler, MD  Referral: Owens Loffler, MD  Chief Complaint  Patient presents with   Annual Exam    Part 2   Subjective:   Xavier Perry is a 76 y.o. very pleasant male patient with Body mass index is 29.57 kg/m. who presents with the following:  He is here to follow-up on general medical problems after his Medicare wellness exam earlier this morning.  He does actually have diabetes, but this has been well controlled off of any medications.  Did great even a few weeks after his hip replacement.   Playing golf 3 times a week.   HTN: Tolerating all medications without side effects Stable and at goal No CP, no sob. No HA.  BP Readings from Last 3 Encounters:  11/08/21 (!) 140/30  03/18/21 112/77  11/05/20 (!) 371/69    Basic Metabolic Panel:    Component Value Date/Time   NA 137 11/01/2021 0737   K 4.5 11/01/2021 0737   CL 102 11/01/2021 0737   CO2 28 11/01/2021 0737   BUN 11 11/01/2021 0737   CREATININE 0.76 11/01/2021 0737   GLUCOSE 114 (H) 11/01/2021 0737   CALCIUM 9.3 11/01/2021 0737    Wt Readings from Last 3 Encounters:  11/08/21 200 lb 4 oz (90.8 kg)  03/18/21 205 lb (93 kg)  03/04/21 205 lb (93 kg)     Health Maintenance  Topic Date Due   FOOT EXAM  Never done   COVID-19 Vaccine (4 - Pfizer risk series) 11/24/2021 (Originally 12/04/2019)   Zoster Vaccines- Shingrix (1 of 2) 02/08/2022 (Originally 08/09/1964)   TETANUS/TDAP  08/12/2026 (Originally 01/30/2021)   OPHTHALMOLOGY EXAM  03/24/2022   HEMOGLOBIN A1C  05/03/2022   Diabetic kidney evaluation - GFR measurement  11/02/2022   Diabetic kidney evaluation - Urine ACR  11/02/2022   Medicare Annual Wellness (AWV)  11/09/2022    Pneumonia Vaccine 69+ Years old  Completed   INFLUENZA VACCINE  Completed   Hepatitis C Screening  Completed   HPV VACCINES  Aged Out   COLONOSCOPY (Pts 45-54yr Insurance coverage will need to be confirmed)  Discontinued    Immunization History  Administered Date(s) Administered   Fluad Quad(high Dose 65+) 09/19/2019, 10/20/2021   Influenza, High Dose Seasonal PF 10/14/2020   Influenza,inj,Quad PF,6+ Mos 11/09/2017, 09/25/2018   Influenza-Unspecified 11/03/2013   PFIZER(Purple Top)SARS-COV-2 Vaccination 02/12/2019, 03/05/2019, 10/09/2019   Pneumococcal Conjugate-13 07/25/2013   Pneumococcal Polysaccharide-23 07/18/2012   Tdap 01/31/2011   Zoster, Live 07/18/2012     Review of Systems is noted in the HPI, as appropriate  Objective:   BP (!) 140/30   Pulse 85   Temp 98.6 F (37 C) (Oral)   Ht '5\' 9"'$  (1.753 m)   Wt 200 lb 4 oz (90.8 kg)   SpO2 95%   BMI 29.57 kg/m   GEN: no acute distress. HEENT: Atraumatic, Normocephalic.  Ears and Nose: No external deformity. CV: RRR, No M/G/R. No JVD. No thrill. No extra heart sounds. PULM: CTA B, no wheezes, crackles, rhonchi. No retractions. No resp. distress. No accessory muscle use. ABD: S, NT, ND, +BS. No rebound. No HSM. EXTR: No c/c/e PSYCH: Normally interactive. Conversant.    Diabetic foot exam: Normal inspection No  skin breakdown No calluses  Normal DP pulses Normal sensation to light tough Nails normal   Laboratory and Imaging Data:  Assessment and Plan:     ICD-10-CM   1. Diet-controlled diabetes mellitus ([place])  E11.9     2. Primary hypertension  I10     3. Hyperlipidemia LDL goal <70  E78.5     4. TIA (transient ischemic attack)  G45.9      Doing well in general without any limitations.   BP stable Lipids stable  Compliant with all meds including ASA post distant TIA  Disposition: 1 year  Dr[employer] One speech-to-text software was used for transcription in this dictation.  Possible  transcriptional errors can occur using Editor, commissioning.   Signed,  Maud Deed. Dawud Mays, MD   Outpatient Encounter Medications as of 11/08/2021  Medication Sig   acetaminophen (TYLENOL) 650 MG CR tablet Take 650 mg by mouth daily.   aspirin 81 MG tablet Take 1 tablet (81 mg total) daily by mouth.   hydrochlorothiazide (HYDRODIURIL) 12.5 MG tablet TAKE ONE TABLET BY MOUTH DAILY   pyridOXINE (VITAMIN B-6) 100 MG tablet Take 100 mg by mouth daily.   rosuvastatin (CRESTOR) 10 MG tablet TAKE ONE TABLET BY MOUTH DAILY   traMADol (ULTRAM) 50 MG tablet TAKE ONE TABLET BY MOUTH EVERY 8 HOURS AS NEEDED FOR MODERATE PAIN   No facility-administered encounter medications on file as of 11/08/2021.

## 2021-11-08 NOTE — Patient Instructions (Signed)
[name] , Thank you for taking time to come for your Medicare Wellness Visit. I appreciate your ongoing commitment to your health goals. Please review the following plan we discussed and let me know if I can assist you in the future.   These are the goals we discussed:  Goals      Increase physical activity     Starting 08/15/2017, I will continue to golf for at least 5 hours 3-4 days per week and do yard work as needed.      Patient Stated     Stay active        This is a list of the screening recommended for you and due dates:  Health Maintenance  Topic Date Due   Complete foot exam   Never done   COVID-19 Vaccine (4 - Pfizer risk series) 11/24/2021*   Zoster (Shingles) Vaccine (1 of 2) 02/08/2022*   Tetanus Vaccine  08/12/2026*   Eye exam for diabetics  03/24/2022   Hemoglobin A1C  05/03/2022   Yearly kidney function blood test for diabetes  11/02/2022   Yearly kidney health urinalysis for diabetes  11/02/2022   Medicare Annual Wellness Visit  11/09/2022   Pneumonia Vaccine  Completed   Flu Shot  Completed   Hepatitis C Screening: USPSTF Recommendation to screen - Ages 18-79 yo.  Completed   HPV Vaccine  Aged Out   Colon Cancer Screening  Discontinued  *Topic was postponed. The date shown is not the original due date.    Advanced directives: yes  Conditions/risks identified:   Next appointment: Follow up in one year for your annual wellness visit. 11-08-2021 @ 3:40  [place]  Preventive Care 28 Years and Older, Male  Preventive care refers to lifestyle choices and visits with your health care provider that can promote health and wellness. What does preventive care include? A yearly physical exam. This is also called an annual well check. Dental exams once or twice a year. Routine eye exams. Ask your health care provider how often you should have your eyes checked. Personal lifestyle choices, including: Daily care of your teeth and gums. Regular physical  activity. Eating a healthy diet. Avoiding tobacco and drug use. Limiting alcohol use. Practicing safe sex. Taking low doses of aspirin every day. Taking vitamin and mineral supplements as recommended by your health care provider. What happens during an annual well check? The services and screenings done by your health care provider during your annual well check will depend on your age, overall health, lifestyle risk factors, and family history of disease. Counseling  Your health care provider may ask you questions about your: Alcohol use. Tobacco use. Drug use. Emotional well-being. Home and relationship well-being. Sexual activity. Eating habits. History of falls. Memory and ability to understand (cognition). Work and work Statistician. Screening  You may have the following tests or measurements: Height, weight, and BMI. Blood pressure. Lipid and cholesterol levels. These may be checked every 5 years, or more frequently if you are over 49 years old. Skin check. Lung cancer screening. You may have this screening every year starting at age 60 if you have a 30-pack-year history of smoking and currently smoke or have quit within the past 15 years. Fecal occult blood test (FOBT) of the stool. You may have this test every year starting at age 73. Flexible sigmoidoscopy or colonoscopy. You may have a sigmoidoscopy every 5 years or a colonoscopy every 10 years starting at age 68. Prostate cancer screening. Recommendations will vary  depending on your family history and other risks. Hepatitis C blood test. Hepatitis B blood test. Sexually transmitted disease (STD) testing. Diabetes screening. This is done by checking your blood sugar (glucose) after you have not eaten for a while (fasting). You may have this done every 1-3 years. Abdominal aortic aneurysm (AAA) screening. You may need this if you are a current or former smoker. Osteoporosis. You may be screened starting at age 3 if you are  at high risk. Talk with your health care provider about your test results, treatment options, and if necessary, the need for more tests. Vaccines  Your health care provider may recommend certain vaccines, such as: Influenza vaccine. This is recommended every year. Tetanus, diphtheria, and acellular pertussis (Tdap, Td) vaccine. You may need a Td booster every 10 years. Zoster vaccine. You may need this after age 57. Pneumococcal 13-valent conjugate (PCV13) vaccine. One dose is recommended after age 18. Pneumococcal polysaccharide (PPSV23) vaccine. One dose is recommended after age 48. Talk to your health care provider about which screenings and vaccines you need and how often you need them. This information is not intended to replace advice given to you by your health care provider. Make sure you discuss any questions you have with your health care provider. Document Released: 01/16/2015 Document Revised: 09/09/2015 Document Reviewed: 10/21/2014 Elsevier Interactive Patient Education  2017 [place] Prevention in the Home Falls can cause injuries. They can happen to people of all ages. There are many things you can do to make your home safe and to help prevent falls. What can I do on the outside of my home? Regularly fix the edges of walkways and driveways and fix any cracks. Remove anything that might make you trip as you walk through a door, such as a raised step or threshold. Trim any bushes or trees on the path to your home. Use bright outdoor lighting. Clear any walking paths of anything that might make someone trip, such as rocks or tools. Regularly check to see if handrails are loose or broken. Make sure that both sides of any steps have handrails. Any raised decks and porches should have guardrails on the edges. Have any leaves, snow, or ice cleared regularly. Use sand or salt on walking paths during winter. Clean up any spills in your garage right away. This includes oil  or grease spills. What can I do in the bathroom? Use night lights. Install grab bars by the toilet and in the tub and shower. Do not use towel bars as grab bars. Use non-skid mats or decals in the tub or shower. If you need to sit down in the shower, use a plastic, non-slip stool. Keep the floor dry. Clean up any water that spills on the floor as soon as it happens. Remove soap buildup in the tub or shower regularly. Attach bath mats securely with double-sided non-slip rug tape. Do not have throw rugs and other things on the floor that can make you trip. What can I do in the bedroom? Use night lights. Make sure that you have a light by your bed that is easy to reach. Do not use any sheets or blankets that are too big for your bed. They should not hang down onto the floor. Have a firm chair that has side arms. You can use this for support while you get dressed. Do not have throw rugs and other things on the floor that can make you trip. What can I do in the  kitchen? Clean up any spills right away. Avoid walking on wet floors. Keep items that you use a lot in easy-to-reach places. If you need to reach something above you, use a strong step stool that has a grab bar. Keep electrical cords out of the way. Do not use floor polish or wax that makes floors slippery. If you must use wax, use non-skid floor wax. Do not have throw rugs and other things on the floor that can make you trip. What can I do with my stairs? Do not leave any items on the stairs. Make sure that there are handrails on both sides of the stairs and use them. Fix handrails that are broken or loose. Make sure that handrails are as long as the stairways. Check any carpeting to make sure that it is firmly attached to the stairs. Fix any carpet that is loose or worn. Avoid having throw rugs at the top or bottom of the stairs. If you do have throw rugs, attach them to the floor with carpet tape. Make sure that you have a light  switch at the top of the stairs and the bottom of the stairs. If you do not have them, ask someone to add them for you. What else can I do to help prevent falls? Wear shoes that: Do not have high heels. Have rubber bottoms. Are comfortable and fit you well. Are closed at the toe. Do not wear sandals. If you use a stepladder: Make sure that it is fully opened. Do not climb a closed stepladder. Make sure that both sides of the stepladder are locked into place. Ask someone to hold it for you, if possible. Clearly mark and make sure that you can see: Any grab bars or handrails. First and last steps. Where the edge of each step is. Use tools that help you move around (mobility aids) if they are needed. These include: Canes. Walkers. Scooters. Crutches. Turn on the lights when you go into a dark area. Replace any light bulbs as soon as they burn out. Set up your furniture so you have a clear path. Avoid moving your furniture around. If any of your floors are uneven, fix them. If there are any pets around you, be aware of where they are. Review your medicines with your doctor. Some medicines can make you feel dizzy. This can increase your chance of falling. Ask your doctor what other things that you can do to help prevent falls. This information is not intended to replace advice given to you by your health care provider. Make sure you discuss any questions you have with your health care provider. Document Released: 10/16/2008 Document Revised: 05/28/2015 Document Reviewed: 01/24/2014 Elsevier Interactive Patient Education  2017 [employer].

## 2021-11-09 DIAGNOSIS — M25552 Pain in left hip: Secondary | ICD-10-CM | POA: Diagnosis not present

## 2021-11-10 ENCOUNTER — Other Ambulatory Visit: Payer: Self-pay | Admitting: Family Medicine

## 2021-11-19 ENCOUNTER — Other Ambulatory Visit: Payer: Self-pay | Admitting: Family Medicine

## 2021-11-19 NOTE — Telephone Encounter (Signed)
Last office visit 11/08/21 for CPE.  Last refilled 07/21/21 for #30 with 3 refills.  No future appointments.

## 2022-03-11 DIAGNOSIS — R69 Illness, unspecified: Secondary | ICD-10-CM | POA: Diagnosis not present

## 2022-03-24 ENCOUNTER — Other Ambulatory Visit: Payer: Self-pay | Admitting: Family Medicine

## 2022-03-24 NOTE — Telephone Encounter (Signed)
Last office visit 11/08/2021 for CPE.  Last refilled 11/19/2021 for #30 with 3 refills.  Next Appt: No futuer appointments.

## 2022-04-28 DIAGNOSIS — H2513 Age-related nuclear cataract, bilateral: Secondary | ICD-10-CM | POA: Diagnosis not present

## 2022-04-28 DIAGNOSIS — E119 Type 2 diabetes mellitus without complications: Secondary | ICD-10-CM | POA: Diagnosis not present

## 2022-04-28 LAB — HM DIABETES EYE EXAM

## 2022-07-23 ENCOUNTER — Other Ambulatory Visit: Payer: Self-pay | Admitting: Family Medicine

## 2022-07-25 NOTE — Telephone Encounter (Signed)
Last office visit 11/08/21 for CPE.  Last refilled 03/24/2022 for #30 with 3 refills.  Next Appt: No future appointments.

## 2022-10-26 ENCOUNTER — Telehealth: Payer: Self-pay | Admitting: *Deleted

## 2022-10-26 DIAGNOSIS — E785 Hyperlipidemia, unspecified: Secondary | ICD-10-CM

## 2022-10-26 DIAGNOSIS — Z79899 Other long term (current) drug therapy: Secondary | ICD-10-CM

## 2022-10-26 DIAGNOSIS — Z125 Encounter for screening for malignant neoplasm of prostate: Secondary | ICD-10-CM

## 2022-10-26 DIAGNOSIS — E119 Type 2 diabetes mellitus without complications: Secondary | ICD-10-CM

## 2022-10-26 NOTE — Telephone Encounter (Signed)
-----   Message from Vincenza Hews sent at 10/26/2022  1:54 PM EDT ----- Regarding: Lab Mon 11/07/22 Hello,  Patient is coming in for CPE labs on Monday 11/07/22. Can we get orders please.   Thanks

## 2022-10-29 ENCOUNTER — Other Ambulatory Visit: Payer: Self-pay | Admitting: Family Medicine

## 2022-10-31 MED ORDER — HYDROCHLOROTHIAZIDE 12.5 MG PO TABS: 12.5000 mg | ORAL_TABLET | Freq: Every day | ORAL | 0 refills | Status: DC

## 2022-10-31 NOTE — Addendum Note (Signed)
Addended by: [name] on: 10/31/2022 10:35 AM   Modules accepted: Orders

## 2022-11-07 ENCOUNTER — Other Ambulatory Visit (INDEPENDENT_AMBULATORY_CARE_PROVIDER_SITE_OTHER): Payer: Medicare HMO

## 2022-11-07 DIAGNOSIS — E785 Hyperlipidemia, unspecified: Secondary | ICD-10-CM

## 2022-11-07 DIAGNOSIS — E119 Type 2 diabetes mellitus without complications: Secondary | ICD-10-CM | POA: Diagnosis not present

## 2022-11-07 DIAGNOSIS — Z125 Encounter for screening for malignant neoplasm of prostate: Secondary | ICD-10-CM

## 2022-11-07 DIAGNOSIS — Z79899 Other long term (current) drug therapy: Secondary | ICD-10-CM

## 2022-11-07 LAB — HEPATIC FUNCTION PANEL
ALT: 26 U/L (ref 0–53)
AST: 28 U/L (ref 0–37)
Albumin: 4.3 g/dL (ref 3.5–5.2)
Alkaline Phosphatase: 79 U/L (ref 39–117)
Bilirubin, Direct: 0.1 mg/dL (ref 0.0–0.3)
Total Bilirubin: 0.5 mg/dL (ref 0.2–1.2)
Total Protein: 7.2 g/dL (ref 6.0–8.3)

## 2022-11-07 LAB — LIPID PANEL
Cholesterol: 155 mg/dL (ref 0–200)
HDL: 87.8 mg/dL (ref >39.00)
LDL Cholesterol: 50 mg/dL (ref 0–99)
NonHDL: 66.75
Total CHOL/HDL Ratio: 2
Triglycerides: 84 mg/dL (ref 0.0–149.0)
VLDL: 16.8 mg/dL (ref 0.0–40.0)

## 2022-11-07 LAB — CBC WITH DIFFERENTIAL/PLATELET
Basophils Absolute: 0.1 10*3/uL (ref 0.0–0.1)
Basophils Relative: 1.1 % (ref 0.0–3.0)
Eosinophils Absolute: 0.3 10*3/uL (ref 0.0–0.7)
Eosinophils Relative: 4.4 % (ref 0.0–5.0)
HCT: 43.4 % (ref 39.0–52.0)
Hemoglobin: 14.2 g/dL (ref 13.0–17.0)
Lymphocytes Relative: 27.2 % (ref 12.0–46.0)
Lymphs Abs: 1.9 10*3/uL (ref 0.7–4.0)
MCHC: 32.8 g/dL (ref 30.0–36.0)
MCV: 94.2 fL (ref 78.0–100.0)
Monocytes Absolute: 0.7 10*3/uL (ref 0.1–1.0)
Monocytes Relative: 9.7 % (ref 3.0–12.0)
Neutro Abs: 3.9 10*3/uL (ref 1.4–7.7)
Neutrophils Relative %: 57.6 % (ref 43.0–77.0)
Platelets: 265 10*3/uL (ref 150.0–400.0)
RBC: 4.61 Mil/uL (ref 4.22–5.81)
RDW: 13 % (ref 11.5–15.5)
WBC: 6.8 10*3/uL (ref 4.0–10.5)

## 2022-11-07 LAB — MICROALBUMIN / CREATININE URINE RATIO
Creatinine,U: 121.1 mg/dL
Microalb Creat Ratio: 0.9 mg/g (ref 0.0–30.0)
Microalb, Ur: 1.1 mg/dL (ref 0.0–1.9)

## 2022-11-07 LAB — BASIC METABOLIC PANEL
BUN: 14 mg/dL (ref 6–23)
CO2: 30 meq/L (ref 19–32)
Calcium: 9.5 mg/dL (ref 8.4–10.5)
Chloride: 101 meq/L (ref 96–112)
Creatinine, Ser: 0.94 mg/dL (ref 0.40–1.50)
GFR: 78.89 mL/min (ref >60.00)
Glucose, Bld: 113 mg/dL — ABNORMAL HIGH (ref 70–99)
Potassium: 5 meq/L (ref 3.5–5.1)
Sodium: 138 meq/L (ref 135–145)

## 2022-11-07 LAB — HEMOGLOBIN A1C: Hgb A1c MFr Bld: 6.2 % (ref 4.6–6.5)

## 2022-11-07 LAB — PSA, MEDICARE: PSA: 0.32 ng/mL (ref 0.10–4.00)

## 2022-11-13 NOTE — Progress Notes (Unsigned)
Maybree Riling T. Bee Marchiano, MD, CAQ Sports Medicine [facility] at [facility] [address] [place], [address]  Phone: [phone]  FAX: [phone]  Xavier Perry - 77 y.o. male  MRN [id number]  Date of Birth: [birth year]  Date: 11/14/2022  PCP: Hannah Beat, MD  Referral: Hannah Beat, MD  No chief complaint on file.  Patient Care Team: Hannah Beat, MD as PCP - General (Family Medicine) Subjective:   Xavier Perry is a 77 y.o. pleasant patient who presents for a medicare wellness examination:  Preventative Health Maintenance Visit:  Health Maintenance Summary Reviewed and updated, unless pt declines services.  Tobacco History Reviewed. Alcohol: No concerns, no excessive use Exercise Habits: Some activity, rec at least 30 mins 5 times a week STD concerns: no risk or activity to increase risk Drug Use: None  Shingrix RSV Tdap Flu Covid booster  He also has a history of well-controlled DM, HTN, TIA, high cholesterol.    Diabetes Mellitus: Tolerating Medications: yes Compliance with diet: fair, There is no height or weight on file to calculate BMI. Exercise: minimal / intermittent Avg blood sugars at home: not checking Foot problems: none Hypoglycemia: none No nausea, vomitting, blurred vision, polyuria.  Lab Results  Component Value Date   HGBA1C 6.2 11/07/2022   HGBA1C 6.1 11/01/2021   HGBA1C 6.1 11/05/2020   Lab Results  Component Value Date   MICROALBUR 1.1 11/07/2022   LDLCALC 50 11/07/2022   CREATININE 0.94 11/07/2022    Wt Readings from Last 3 Encounters:  11/08/21 200 lb 4 oz (90.8 kg)  03/18/21 205 lb (93 kg)  03/04/21 205 lb (93 kg)    HTN: Tolerating all medications without side effects Stable and at goal No CP, no sob. No HA.  BP Readings from Last 3 Encounters:  11/08/21 (!) 140/80  03/18/21 112/77  11/05/20 (!) 144/78    Basic Metabolic Panel:    Component Value Date/Time   NA 138 11/07/2022 0743    K 5.0 11/07/2022 0743   CL 101 11/07/2022 0743   CO2 30 11/07/2022 0743   BUN 14 11/07/2022 0743   CREATININE 0.94 11/07/2022 0743   GLUCOSE 113 (H) 11/07/2022 0743   CALCIUM 9.5 11/07/2022 0743     Health Maintenance  Topic Date Due   Zoster Vaccines- Shingrix (1 of 2) 08/09/1964   DTaP/Tdap/Td (2 - Td or Tdap) 01/30/2021   INFLUENZA VACCINE  08/04/2022   COVID-19 Vaccine (4 - 2023-24 season) 09/04/2022   Medicare Annual Wellness (AWV)  11/09/2022   FOOT EXAM  11/09/2022   OPHTHALMOLOGY EXAM  04/28/2023   HEMOGLOBIN A1C  05/07/2023   Diabetic kidney evaluation - eGFR measurement  11/07/2023   Diabetic kidney evaluation - Urine ACR  11/07/2023   Pneumonia Vaccine 95+ Years old  Completed   Hepatitis C Screening  Completed   HPV VACCINES  Aged Out   Colonoscopy  Discontinued    Immunization History  Administered Date(s) Administered   Fluad Quad(high Dose 65+) 09/19/2019, 10/20/2021   Influenza, High Dose Seasonal PF 10/14/2020   Influenza,inj,Quad PF,6+ Mos 11/09/2017, 09/25/2018   Influenza-Unspecified 11/03/2013   PFIZER(Purple Top)SARS-COV-2 Vaccination 02/12/2019, 03/05/2019, 10/09/2019   Pneumococcal Conjugate-13 07/25/2013   Pneumococcal Polysaccharide-23 07/18/2012   Tdap 01/31/2011   Zoster, Live 07/18/2012    Patient Active Problem List   Diagnosis Date Noted   Diet-controlled diabetes mellitus (HCC) 11/22/2018    Priority: High   TIA (transient ischemic attack) 11/15/2016    Priority: High  Hypertension 08/29/2017    Priority: Medium    Hyperlipidemia LDL goal <70 02/15/2017    Priority: Medium    Severe multilevel lumbar spinal stenosis 10/23/2019   Aneurysm of abdominal vessel (HCC) 09/19/2013   History of colonic polyps 08/30/2012   GERD (gastroesophageal reflux disease) 09/19/2011    Past Medical History:  Diagnosis Date   Basal cell carcinoma    Chronic bilateral low back pain with bilateral sciatica 02/16/2017   Diabetes mellitus without  complication (HCC)    diet control-no meds   GERD (gastroesophageal reflux disease) 09/19/2011   History of repair of rotator cuff 09/19/2011   L 2009 and 2010   Hypertension 08/29/2017   Personal history of colonic adenomas 08/30/2012   Squamous cell carcinoma in situ of skin 09/19/2011   TIA (transient ischemic attack) 2017    Past Surgical History:  Procedure Laterality Date   BASAL CELL CARCINOMA EXCISION     CARPAL TUNNEL RELEASE     COLONOSCOPY  08/30/2012   COLONOSCOPY WITH PROPOFOL  11/06/2014   Dr.Gessner   POLYPECTOMY     ROTATOR CUFF REPAIR Left 2009 and 2010   TONSILECTOMY, ADENOIDECTOMY, BILATERAL MYRINGOTOMY AND TUBES  1967   TOTAL HIP ARTHROPLASTY Right 01/2018   TOTAL HIP ARTHROPLASTY Left 11/2020    Family History  Problem Relation Age of Onset   Varicose Veins Mother    Cancer Father    Colon cancer Neg Hx    Esophageal cancer Neg Hx    Rectal cancer Neg Hx    Stomach cancer Neg Hx    Colon polyps Neg Hx     Social History   Social History Narrative   Not on file    Past Medical History, Surgical History, Social History, Family History, Problem List, Medications, and Allergies have been reviewed and updated if relevant.  Review of Systems: Pertinent positives are listed above.  Otherwise, a full 14 point review of systems has been done in full and it is negative except where it is noted positive.  Objective:   There were no vitals taken for this visit.    08/27/2018    2:56 PM 10/02/2018    9:12 AM 11/05/2020    8:49 AM 11/08/2021    8:32 AM 11/07/2022    4:40 PM  Fall Risk  Falls in the past year? 0 0 0 0 0  Was there an injury with Fall?    0 0  Fall Risk Category Calculator    0 0  Fall Risk Category (Retired)    Low   (RETIRED) Patient Fall Risk Level  Low fall risk  Low fall risk   Fall risk Follow up    Falls evaluation completed;Education provided;Falls prevention discussed    Ideal Body Weight:   No results found.    11/08/2021     8:38 AM 11/05/2020    8:49 AM 10/02/2018    9:12 AM 08/27/2018    2:56 PM 08/15/2017   11:51 AM  Depression screen PHQ 2/9  Decreased Interest 0 0 0 0 0  Down, Depressed, Hopeless 0 0 0 0 0  PHQ - 2 Score 0 0 0 0 0  Altered sleeping 0    0  Tired, decreased energy 0    0  Change in appetite 0    0  Feeling bad or failure about yourself  0    0  Trouble concentrating 0    0  Moving slowly or fidgety/restless 0  0  Suicidal thoughts 0    0  PHQ-9 Score 0    0  Difficult doing work/chores Not difficult at all    Not difficult at all     GEN: well developed, well nourished, no acute distress Eyes: conjunctiva and lids normal, PERRLA, EOMI ENT: TM clear, nares clear, oral exam WNL Neck: supple, no lymphadenopathy, no thyromegaly, no JVD Pulm: clear to auscultation and percussion, respiratory effort normal CV: regular rate and rhythm, S1-S2, no murmur, rub or gallop, no bruits, peripheral pulses normal and symmetric, no cyanosis, clubbing, edema or varicosities GI: soft, non-tender; no hepatosplenomegaly, masses; active bowel sounds all quadrants GU: deferred Lymph: no cervical, axillary or inguinal adenopathy MSK: gait normal, muscle tone and strength WNL, no joint swelling, effusions, discoloration, crepitus  SKIN: clear, good turgor, color WNL, no rashes, lesions, or ulcerations Neuro: normal mental status, normal strength, sensation, and motion Psych: alert; oriented to person, place and time, normally interactive and not anxious or depressed in appearance.  All labs reviewed with patient.  Results for orders placed or performed in visit on 11/07/22  PSA, Medicare  Result Value Ref Range   PSA 0.32 0.10 - 4.00 ng/ml  Hepatic Function Panel  Result Value Ref Range   Total Bilirubin 0.5 0.2 - 1.2 mg/dL   Bilirubin, Direct 0.1 0.0 - 0.3 mg/dL   Alkaline Phosphatase 79 39 - 117 U/L   AST 28 0 - 37 U/L   ALT 26 0 - 53 U/L   Total Protein 7.2 6.0 - 8.3 g/dL   Albumin 4.3 3.5 -  5.2 g/dL  CBC with Differential/Platelet  Result Value Ref Range   WBC 6.8 4.0 - 10.5 K/uL   RBC 4.61 4.22 - 5.81 Mil/uL   Hemoglobin 14.2 13.0 - 17.0 g/dL   HCT 09.8 11.9 - 14.7 %   MCV 94.2 78.0 - 100.0 fl   MCHC 32.8 30.0 - 36.0 g/dL   RDW 82.9 56.2 - 13.0 %   Platelets 265.0 150.0 - 400.0 K/uL   Neutrophils Relative % 57.6 43.0 - 77.0 %   Lymphocytes Relative 27.2 12.0 - 46.0 %   Monocytes Relative 9.7 3.0 - 12.0 %   Eosinophils Relative 4.4 0.0 - 5.0 %   Basophils Relative 1.1 0.0 - 3.0 %   Neutro Abs 3.9 1.4 - 7.7 K/uL   Lymphs Abs 1.9 0.7 - 4.0 K/uL   Monocytes Absolute 0.7 0.1 - 1.0 K/uL   Eosinophils Absolute 0.3 0.0 - 0.7 K/uL   Basophils Absolute 0.1 0.0 - 0.1 K/uL  Microalbumin / creatinine urine ratio  Result Value Ref Range   Microalb, Ur 1.1 0.0 - 1.9 mg/dL   Creatinine,U 865.7 mg/dL   Microalb Creat Ratio 0.9 0.0 - 30.0 mg/g  Lipid panel  Result Value Ref Range   Cholesterol 155 0 - 200 mg/dL   Triglycerides 84.6 0.0 - 149.0 mg/dL   HDL 96.29 >52.84 mg/dL   VLDL 13.2 0.0 - 44.0 mg/dL   LDL Cholesterol 50 0 - 99 mg/dL   Total CHOL/HDL Ratio 2    NonHDL 66.75   Hemoglobin A1c  Result Value Ref Range   Hgb A1c MFr Bld 6.2 4.6 - 6.5 %  Basic metabolic panel  Result Value Ref Range   Sodium 138 135 - 145 mEq/L   Potassium 5.0 3.5 - 5.1 mEq/L   Chloride 101 96 - 112 mEq/L   CO2 30 19 - 32 mEq/L   Glucose, Bld 113 (H) 70 -  99 mg/dL   BUN 14 6 - 23 mg/dL   Creatinine, Ser 6.29 0.40 - 1.50 mg/dL   GFR 52.84 >13.24 mL/min   Calcium 9.5 8.4 - 10.5 mg/dL    Assessment and Plan:     ICD-10-CM   1. Healthcare maintenance  Z00.00       Health Maintenance Exam: The patient's preventative maintenance and recommended screening tests for an annual wellness exam were reviewed in full today. Brought up to date unless services declined.  Counselled on the importance of diet, exercise, and its role in overall health and mortality. The patient's FH and SH was  reviewed, including their home life, tobacco status, and drug and alcohol status.  Follow-up in 1 year for physical exam or additional follow-up below.  I have personally reviewed the Medicare Annual Wellness questionnaire and have noted 1. The patient's medical and social history 2. Their use of alcohol, tobacco or illicit drugs 3. Their current medications and supplements 4. The patient's functional ability including ADL's, fall risks, home safety risks and hearing or visual             impairment. 5. Diet and physical activities 6. Evidence for depression or mood disorders 7. Reviewed Updated provider list, see scanned forms and [facility] Snapshot.  8. Reviewed whether or not the patient has HCPOA or living will, and discussed what this means with the patient.  Recommended he bring in a copy for his chart in [facility].  The patients weight, height, BMI and visual acuity have been recorded in the chart I have made referrals, counseling and provided education to the patient based review of the above and I have provided the pt with a written personalized care plan for preventive services.  I have provided the patient with a copy of your personalized plan for preventive services. Instructed to take the time to review along with their updated medication list.  Disposition: No follow-ups on file.  Future Appointments  Date Time Provider Department Center  11/14/2022  8:20 AM Stassi Fadely, Karleen Hampshire, MD [facility]    No orders of the defined types were placed in this encounter.  There are no discontinued medications. No orders of the defined types were placed in this encounter.   Signed,  Elpidio Galea. Neev Mcmains, MD   Allergies as of 11/14/2022   No Known Allergies      Medication List        Accurate as of November 13, 2022 12:15 PM. If you have any questions, ask your nurse or doctor.          acetaminophen 650 MG CR tablet Commonly known as: TYLENOL Take 650 mg by mouth daily.    aspirin 81 MG tablet Take 1 tablet (81 mg total) daily by mouth.   hydrochlorothiazide 12.5 MG tablet Commonly known as: HYDRODIURIL Take 1 tablet (12.5 mg total) by mouth daily.   pyridOXINE 100 MG tablet Commonly known as: VITAMIN B6 Take 100 mg by mouth daily.   rosuvastatin 10 MG tablet Commonly known as: CRESTOR TAKE 1 TABLET BY MOUTH DAILY   traMADol 50 MG tablet Commonly known as: ULTRAM Take 1 tablet (50 mg total) by mouth every 8 (eight) hours as needed for moderate pain.

## 2022-11-14 ENCOUNTER — Encounter: Payer: Self-pay | Admitting: Family Medicine

## 2022-11-14 ENCOUNTER — Ambulatory Visit (INDEPENDENT_AMBULATORY_CARE_PROVIDER_SITE_OTHER): Payer: Medicare HMO | Admitting: Family Medicine

## 2022-11-14 VITALS — BP 110/72 | HR 64 | Temp 98.1°F | Ht 69.5 in | Wt 203.4 lb

## 2022-11-14 DIAGNOSIS — Z0000 Encounter for general adult medical examination without abnormal findings: Secondary | ICD-10-CM | POA: Diagnosis not present

## 2022-11-14 MED ORDER — TRAMADOL HCL 50 MG PO TABS: 50.0000 mg | ORAL_TABLET | Freq: Three times a day (TID) | ORAL | 3 refills | Status: DC | PRN

## 2022-11-14 NOTE — Patient Instructions (Addendum)
Shingrix vaccine (shingles)  RSV vaccine  Tetanus booster  Covid booster

## 2023-01-07 DIAGNOSIS — J101 Influenza due to other identified influenza virus with other respiratory manifestations: Secondary | ICD-10-CM | POA: Diagnosis not present

## 2023-01-07 DIAGNOSIS — R059 Cough, unspecified: Secondary | ICD-10-CM | POA: Diagnosis not present

## 2023-01-07 DIAGNOSIS — R509 Fever, unspecified: Secondary | ICD-10-CM | POA: Diagnosis not present

## 2023-01-26 ENCOUNTER — Other Ambulatory Visit: Payer: Self-pay | Admitting: Family Medicine

## 2023-01-26 DIAGNOSIS — L244 Irritant contact dermatitis due to drugs in contact with skin: Secondary | ICD-10-CM | POA: Diagnosis not present

## 2023-01-26 DIAGNOSIS — L814 Other melanin hyperpigmentation: Secondary | ICD-10-CM | POA: Diagnosis not present

## 2023-01-26 DIAGNOSIS — D225 Melanocytic nevi of trunk: Secondary | ICD-10-CM | POA: Diagnosis not present

## 2023-01-26 DIAGNOSIS — L218 Other seborrheic dermatitis: Secondary | ICD-10-CM | POA: Diagnosis not present

## 2023-01-26 DIAGNOSIS — Z85828 Personal history of other malignant neoplasm of skin: Secondary | ICD-10-CM | POA: Diagnosis not present

## 2023-01-26 DIAGNOSIS — L570 Actinic keratosis: Secondary | ICD-10-CM | POA: Diagnosis not present

## 2023-01-26 DIAGNOSIS — X32XXXA Exposure to sunlight, initial encounter: Secondary | ICD-10-CM | POA: Diagnosis not present

## 2023-01-26 DIAGNOSIS — D2272 Melanocytic nevi of left lower limb, including hip: Secondary | ICD-10-CM | POA: Diagnosis not present

## 2023-01-26 DIAGNOSIS — L821 Other seborrheic keratosis: Secondary | ICD-10-CM | POA: Diagnosis not present

## 2023-01-26 DIAGNOSIS — D2271 Melanocytic nevi of right lower limb, including hip: Secondary | ICD-10-CM | POA: Diagnosis not present

## 2023-01-26 DIAGNOSIS — D2261 Melanocytic nevi of right upper limb, including shoulder: Secondary | ICD-10-CM | POA: Diagnosis not present

## 2023-01-26 DIAGNOSIS — D2262 Melanocytic nevi of left upper limb, including shoulder: Secondary | ICD-10-CM | POA: Diagnosis not present

## 2023-03-07 NOTE — Progress Notes (Signed)
 Norman Piacentini T. Jrue Yambao, MD, CAQ Sports Medicine [facility] [address] [place], [address]  Phone: [phone]  FAX: [phone]  Xavier Perry - 78 y.o. male  MRN [id number]  Date of Birth: 1945-11-10  Date: 03/09/2023  PCP: Hannah Beat, MD  Referral: Hannah Beat, MD  Chief Complaint  Patient presents with   Cough    Had Flu Jan 3 but can't shake the cough   Rash    On Hip   Hip Pain    Left-Lower Buttock Area   Subjective:   Xavier Perry is a 78 y.o. very pleasant male patient with Body mass index is 29.04 kg/m. who presents with the following:  Xavier Perry is a very nice patient who I have known for many years, he presents with an ongoing persistent cough after a diagnosis of influenza.  Had flu a in January - took tamiflu About a week later, went away Weak and no energy Persistent cough and in the morning will prod cough He does have a long history of smoking He does have a cough with some intermittent wheezing as well.  He also has a small scaly rash on his hip and buttocks.  While he did change soaps for a few days, he has already changed back to his typical soap and regiment without any significant impact to the rash.  It is not painful, red, or hot.  He also previously was playing golf and he swung very hard and started to fall and felt a pop in his lower buttocks.  He has not had any bruising or swelling in the region.  He does like is getting a little bit better.  He has taped golf since then and he is doing okay intervally.  Did have a history of prior anterior hip arthroplasty, so he is little bit concerned this could potentially be from his hip joint.  Wheeze B lung bases Abx, steroids  Lung ca scr program  Review of Systems is noted in the HPI, as appropriate  Objective:   BP 132/68 (BP Location: Right Arm, Patient Position: Sitting, Cuff Size: Large)   Pulse 64   Temp 97.9 F (36.6 C) (Temporal)   Ht 5' 9.5"  (1.765 m)   Wt 199 lb 8 oz (90.5 kg)   SpO2 96%   BMI 29.04 kg/m    Gen: WDWN, NAD. Globally Non-toxic HEENT: Throat clear, w/o exudate, R TM clear, L TM - good landmarks, No fluid present. rhinnorhea.  MMM Frontal sinuses: NT Max sinuses: NT NECK: Anterior cervical  LAD is absent CV: RRR, No M/G/R, cap refill <2 sec PULM: Breathing comfortably in no respiratory distress.  He does have some mild wheezing in the bilateral lower lobes without crackles or rhonchi  Few patches around the hip and buttocks area of flattened mildly scaly macular rash  HIP EXAM: SIDE: Left ROM: Abduction, Flexion, Internal and External range of motion: Full Pain with terminal IROM and EROM: Minimal to mild posterior buttocks pain with rotation GTB: NT SLR: NEG Knees: No effusion FABER: NT REVERSE FABER: Some discomfort Piriformis: NT at direct palpation. He does have pain on the inferior aspect of the gluteus on the left just caudal to the ischial tuberosity there is no significant tenderness inferior to the gluteus in and about the proximal hamstring Str: flexion: 5/5 abduction: 5/5 adduction: 5/5 Strength testing non-tender    Laboratory and Imaging Data:  Assessment and Plan:     ICD-10-CM  1. Subacute cough  R05.2 DG Chest 2 View    2. Rash  R21     3. Smoker  F17.200 DG Chest 2 View    Ambulatory Referral Lung Cancer Screening [facility]    CANCELED: Ambulatory Referral Lung Cancer Screening [facility]    4. Screening for lung cancer  Z12.2     5. Strain of tendon of left gluteus muscle  S76.012A      To me the patient's chest x-ray looks normal.  I am going to have radiology read this, as well.  Given the wheezing and persistent coughing, I think it is most prudent to place him on some antibiotics.  He is a longstanding smoker, so we will set him up for screening lung cancer CT as well.  I will also place him on some oral steroids.  The posterior buttocks pain is  consistent with left-sided gluteus insertional injury caudal to the ischial tuberosity.  No concern for hamstring injury.  The joint moves well.  Location was consistent with acute muscle injury, and I do not see how this could possibly relate to his prior hip arthroplasty.  Mild rash.  Betamethasone.  Medication Management during today's office visit: Meds ordered this encounter  Medications   betamethasone valerate ointment (VALISONE) 0.1 %    Sig: Apply 1 Application topically 2 (two) times daily.    Dispense:  30 g    Refill:  0   doxycycline (VIBRA-TABS) 100 MG tablet    Sig: Take 1 tablet (100 mg total) by mouth 2 (two) times daily.    Dispense:  20 tablet    Refill:  0   predniSONE (DELTASONE) 20 MG tablet    Sig: 2 tabs po daily for 5 days, then 1 tab po daily for 5 days    Dispense:  15 tablet    Refill:  0   Medications Discontinued During This Encounter  Medication Reason   pyridOXINE (VITAMIN B-6) 100 MG tablet Completed Course    Orders placed today for conditions managed today: Orders Placed This Encounter  Procedures   DG Chest 2 View   Ambulatory Referral Lung Cancer Screening [facility]    Disposition: No follow-ups on file.  Dr[employer] One speech-to-text software was used for transcription in this dictation.  Possible transcriptional errors can occur using Animal nutritionist.   Signed,  Elpidio Galea. Sabiha Sura, MD   Outpatient Encounter Medications as of 03/09/2023  Medication Sig   betamethasone valerate ointment (VALISONE) 0.1 % Apply 1 Application topically 2 (two) times daily.   doxycycline (VIBRA-TABS) 100 MG tablet Take 1 tablet (100 mg total) by mouth 2 (two) times daily.   predniSONE (DELTASONE) 20 MG tablet 2 tabs po daily for 5 days, then 1 tab po daily for 5 days   triamcinolone (KENALOG) 0.025 % cream Apply 1 Application topically daily as needed.   acetaminophen (TYLENOL) 650 MG CR tablet Take 650 mg by mouth daily.   aspirin 81 MG  tablet Take 1 tablet (81 mg total) daily by mouth.   cyanocobalamin (VITAMIN B12) 1000 MCG tablet Take 1,000 mcg by mouth daily.   hydrochlorothiazide (HYDRODIURIL) 12.5 MG tablet TAKE 1 TABLET BY MOUTH DAILY   rosuvastatin (CRESTOR) 10 MG tablet TAKE 1 TABLET BY MOUTH DAILY   traMADol (ULTRAM) 50 MG tablet Take 1 tablet (50 mg total) by mouth every 8 (eight) hours as needed for moderate pain (pain score 4-6).   [DISCONTINUED] pyridOXINE (VITAMIN B-6) 100 MG tablet  Take 100 mg by mouth daily.   No facility-administered encounter medications on file as of 03/09/2023.

## 2023-03-09 ENCOUNTER — Ambulatory Visit: Admitting: Family Medicine

## 2023-03-09 ENCOUNTER — Ambulatory Visit
Admission: RE | Admit: 2023-03-09 | Discharge: 2023-03-09 | Disposition: A | Discharge status: Home / Self Care | Source: Ambulatory Visit | Attending: Family Medicine | Admitting: Family Medicine

## 2023-03-09 VITALS — BP 132/68 | HR 64 | Temp 97.9°F | Ht 69.5 in | Wt 199.5 lb

## 2023-03-09 DIAGNOSIS — Z122 Encounter for screening for malignant neoplasm of respiratory organs: Secondary | ICD-10-CM | POA: Diagnosis not present

## 2023-03-09 DIAGNOSIS — F17200 Nicotine dependence, unspecified, uncomplicated: Secondary | ICD-10-CM | POA: Diagnosis not present

## 2023-03-09 DIAGNOSIS — R052 Subacute cough: Secondary | ICD-10-CM | POA: Diagnosis not present

## 2023-03-09 DIAGNOSIS — S76012A Strain of muscle, fascia and tendon of left hip, initial encounter: Secondary | ICD-10-CM

## 2023-03-09 DIAGNOSIS — R059 Cough, unspecified: Secondary | ICD-10-CM | POA: Diagnosis not present

## 2023-03-09 DIAGNOSIS — R21 Rash and other nonspecific skin eruption: Secondary | ICD-10-CM | POA: Diagnosis not present

## 2023-03-09 MED ORDER — PREDNISONE 20 MG PO TABS: ORAL_TABLET | ORAL | 0 refills | Status: DC

## 2023-03-09 MED ORDER — DOXYCYCLINE HYCLATE 100 MG PO TABS: 100.0000 mg | ORAL_TABLET | Freq: Two times a day (BID) | ORAL | 0 refills | Status: DC

## 2023-03-09 MED ORDER — BETAMETHASONE VALERATE 0.1 % EX OINT: 1.0000 | TOPICAL_OINTMENT | Freq: Two times a day (BID) | CUTANEOUS | 0 refills | Status: DC

## 2023-03-10 ENCOUNTER — Encounter: Payer: Self-pay | Admitting: Family Medicine

## 2023-03-21 ENCOUNTER — Other Ambulatory Visit: Payer: Self-pay | Admitting: Family Medicine

## 2023-03-21 NOTE — Telephone Encounter (Signed)
 Last office visit 03/09/2023 for cough, rash, hip pain.  Last refilled 11/14/22 for #30 with 3 refills.  Next Appt: No future appointments.

## 2023-03-23 ENCOUNTER — Telehealth: Payer: Self-pay | Admitting: Acute Care

## 2023-03-23 ENCOUNTER — Telehealth: Payer: Self-pay | Admitting: *Deleted

## 2023-03-23 DIAGNOSIS — Z87891 Personal history of nicotine dependence: Secondary | ICD-10-CM

## 2023-03-23 DIAGNOSIS — Z122 Encounter for screening for malignant neoplasm of respiratory organs: Secondary | ICD-10-CM

## 2023-03-23 DIAGNOSIS — F17210 Nicotine dependence, cigarettes, uncomplicated: Secondary | ICD-10-CM

## 2023-03-23 NOTE — Telephone Encounter (Signed)
 Spoke with Xavier Perry.  He is upset that he never heard from anyone about his Lung Cancer Screening CT.  He states he finally called and did speak with Karlton Lemon today and got scheduled but he wants to know if this will be covered by his insurance.  If it is not covered.  He wants this appointment to be cancelled.  Will forward to Kandice Robinsons to follow up with patient about coverage.

## 2023-03-23 NOTE — Telephone Encounter (Signed)
 Copied from CRM [id number]. Topic: General - Other >> Mar 23, 2023  1:19 PM Xavier Perry wrote: Reason for CRM: patient is needing a call back regarding his ct scan he is wanting to know what is the hold up with the ct scan he would like a call back

## 2023-03-23 NOTE — Telephone Encounter (Signed)
 Lung Cancer Screening Narrative/Criteria Questionnaire (Cigarette Smokers Only- No Cigars/Pipes/vapes)   Jenelle Mages   SDMV:04/13/23 at 0915 Orpha Bur                                           [birth year]              LDCT: 04/18/23 at 0930a/ [facility] [place]    78 y.o.   Phone: [phone]  Lung Screening Narrative (confirm age 50-77 yrs Medicare / 50-80 yrs Private pay insurance)   Insurance information:[employer] medicare   Referring Provider:[place]   This screening involves an initial phone call with a team member from our program. It is called a shared decision making visit. The initial meeting is required by insurance and Medicare to make sure you understand the program. This appointment takes about 15-20 minutes to complete. The CT scan will completed at a separate date/time. This scan takes about 5-10 minutes to complete and you may eat and drink before and after the scan.  Criteria questions for Lung Cancer Screening:   Are you a current or former smoker? Current Age began smoking: 78 yo    If you are a former smoker, what year did you quit smoking? N/A(within 15 yrs)   To calculate your smoking history, I need an accurate estimate of how many packs of cigarettes you smoked per day and for how many years. (Not just the number of PPD you are now smoking)   Years smoking 61 x Packs per day 1/2 to 1.5 = Pack years 49   (at least 20 pack yrs)   (Make sure they understand that we need to know how much they have smoked in the past, not just the number of PPD they are smoking now)  Do you have a personal history of cancer?  No    Do you have a family history of cancer? Yes  (cancer type and and relative) father .throat  Are you coughing up blood?  No  Have you had unexplained weight loss of 15 lbs or more in the last 6 months? No  It looks like you meet all criteria.     Additional information: N/A

## 2023-03-28 ENCOUNTER — Encounter: Payer: Self-pay | Admitting: Family Medicine

## 2023-04-13 ENCOUNTER — Ambulatory Visit: Admitting: Adult Health

## 2023-04-13 ENCOUNTER — Encounter: Payer: Self-pay | Admitting: Adult Health

## 2023-04-13 DIAGNOSIS — F17210 Nicotine dependence, cigarettes, uncomplicated: Secondary | ICD-10-CM

## 2023-04-13 NOTE — Patient Instructions (Signed)

## 2023-04-13 NOTE — Progress Notes (Signed)
  Virtual Visit via Telephone Note  I connected with Xavier Perry , 04/13/23 9:20 AM by a telemedicine application and verified that I am speaking with the correct person using two identifiers.  Location: Patient: home Provider: home   I discussed the limitations of evaluation and management by telemedicine and the availability of in person appointments. The patient expressed understanding and agreed to proceed.   Shared Decision Making Visit Lung Cancer Screening Program [phone])   Eligibility: 78 y.o. Pack Years Smoking History Calculation = 61 pack years  (# packs/per year x # years smoked) Recent History of coughing up blood  no Unexplained weight loss? no ( >Than 15 pounds within the last 6 months ) Prior History Lung / other cancer no (Diagnosis within the last 5 years already requiring surveillance chest CT Scans). Smoking Status Current Smoker  Visit Components: Discussion included one or more decision making aids. YES Discussion included risk/benefits of screening. YES Discussion included potential follow up diagnostic testing for abnormal scans. YES Discussion included meaning and risk of over diagnosis. YES Discussion included meaning and risk of False Positives. YES Discussion included meaning of total radiation exposure. YES  Counseling Included: Importance of adherence to annual lung cancer LDCT screening. YES Impact of comorbidities on ability to participate in the program. YES Ability and willingness to under diagnostic treatment. YES  Smoking Cessation Counseling: Current Smokers:  Discussed importance of smoking cessation. yes Information about tobacco cessation classes and interventions provided to patient. yes Patient provided with "ticket" for LDCT Scan. yes Symptomatic Patient. NO Diagnosis Code: Tobacco Use Z72.0 Asymptomatic Patient yes  Counseling (Intermediate counseling: > three minutes counseling) U0454 (CT Chest Lung Cancer Screening Low Dose  W/O CM) [id number]  Z12.2-Screening of respiratory organs Z87.891-Personal history of nicotine dependence   Danford Bad 04/13/23

## 2023-04-18 ENCOUNTER — Ambulatory Visit
Admission: RE | Admit: 2023-04-18 | Discharge: 2023-04-18 | Disposition: A | Discharge status: Home / Self Care | Source: Ambulatory Visit | Attending: Family Medicine | Admitting: Family Medicine

## 2023-04-18 DIAGNOSIS — Z87891 Personal history of nicotine dependence: Secondary | ICD-10-CM

## 2023-04-18 DIAGNOSIS — F17210 Nicotine dependence, cigarettes, uncomplicated: Secondary | ICD-10-CM | POA: Diagnosis not present

## 2023-04-18 DIAGNOSIS — Z122 Encounter for screening for malignant neoplasm of respiratory organs: Secondary | ICD-10-CM

## 2023-05-04 DIAGNOSIS — H2513 Age-related nuclear cataract, bilateral: Secondary | ICD-10-CM | POA: Diagnosis not present

## 2023-05-04 DIAGNOSIS — E119 Type 2 diabetes mellitus without complications: Secondary | ICD-10-CM | POA: Diagnosis not present

## 2023-05-04 LAB — OPHTHALMOLOGY REPORT-SCANNED

## 2023-05-16 ENCOUNTER — Telehealth: Payer: Self-pay | Admitting: *Deleted

## 2023-05-16 NOTE — Telephone Encounter (Signed)
 Copied from CRM [id number]. Topic: Clinical - Lab/Test Results >> May 16, 2023 11:34 AM Kita Perish H wrote: Reason for CRM: Patient is calling to obtain imaging results done on 4/15 that Dr. Geralyn Knee ordered, please reach out.  Autry Legions [phone]

## 2023-05-16 NOTE — Telephone Encounter (Signed)
 Mr. Nistler notified by telephone the information provided by Charmayne Cooper.  Patient states understanding.

## 2023-05-16 NOTE — Telephone Encounter (Signed)
 Spoke with Xavier Perry to let him know the MRI has not been finalized.  I will have Terri call to see if we can't get this read since the scan was done almost a month ago.    Terri,  Can you please call and see if we can't get his MRI ready.  MRI done 04/18/2023.

## 2023-05-16 NOTE — Telephone Encounter (Signed)
 Per Terri:  It is a screening and they don't read them stat, they are now reading 4.11.25, so it is in line to be read.

## 2023-05-17 ENCOUNTER — Telehealth: Payer: Self-pay | Admitting: Acute Care

## 2023-05-17 NOTE — Telephone Encounter (Signed)
 Received a call report from [facility] regarding pt's LDCT performed on 04/18/2023. Impression is below.    IMPRESSION: 1. 13.7 mm anterior right lower lobe nodule. Lung-RADS 4A, suspicious. Follow up low-dose chest CT without contrast in 3 months (please use the following order, "CT CHEST LCS NODULE FOLLOW-UP W/O CM") is recommended. Alternatively, PET may be considered when there is a solid component 8mm or larger. These results will be called to the ordering clinician or representative by the Radiologist Assistant, and communication documented in the PACS or [employer]. 2. Cholelithiasis. 3. Aortic atherosclerosis (ICD10-I70.0). Coronary artery calcification. 4.  Emphysema (ICD10-J43.9).     Electronically Signed   By: Shearon Denis M.D.   On: 05/17/2023 14:23

## 2023-05-18 ENCOUNTER — Telehealth: Payer: Self-pay

## 2023-05-18 ENCOUNTER — Telehealth: Payer: Self-pay | Admitting: Acute Care

## 2023-05-18 ENCOUNTER — Other Ambulatory Visit: Payer: Self-pay

## 2023-05-18 DIAGNOSIS — R911 Solitary pulmonary nodule: Secondary | ICD-10-CM

## 2023-05-18 DIAGNOSIS — Z122 Encounter for screening for malignant neoplasm of respiratory organs: Secondary | ICD-10-CM

## 2023-05-18 DIAGNOSIS — F17210 Nicotine dependence, cigarettes, uncomplicated: Secondary | ICD-10-CM

## 2023-05-18 DIAGNOSIS — Z87891 Personal history of nicotine dependence: Secondary | ICD-10-CM

## 2023-05-18 NOTE — Telephone Encounter (Signed)
 See provider note from 05/18/2023.

## 2023-05-18 NOTE — Telephone Encounter (Signed)
 I have called the patient with the results of his low-dose screening CT.  His scan was read as a lung RADS 4A.  There was notation of a 13.7 mm anterior right lower lobe pulmonary nodule.  Recommendation per radiology is for 60-month follow-up scan. This is the patient's first scan, plan will be for a 81-month follow-up which will be due mid July 2025. I spoke with the patient he denies any hemoptysis or unexplained weight loss. Lex Redbird, and [place] please fax results to PCP and let them know plan for follow-up. Please place order for follow-up low-dose CT due mid July 2025 for reevaluation of the 13.7 mm anterior right lower lobe pulmonary nodule.  Thank you so much

## 2023-05-18 NOTE — Telephone Encounter (Signed)
 3 Month F/U order placed. Results and plan to PCP. Reminder set to outreach patient at a later date to schedule scan.

## 2023-05-18 NOTE — Telephone Encounter (Signed)
 Copied from CRM [id number]. Topic: Clinical - Lab/Test Results >> May 18, 2023 11:11 AM Xavier Perry wrote: Reason for CRM: Patient received CT results from MyChart and need to discuss results. Please call.

## 2023-05-24 NOTE — Telephone Encounter (Signed)
 Noted. Nothing further needed.

## 2023-07-18 ENCOUNTER — Ambulatory Visit
Admission: RE | Admit: 2023-07-18 | Discharge: 2023-07-18 | Disposition: A | Discharge status: Home / Self Care | Source: Ambulatory Visit | Attending: Acute Care | Admitting: Acute Care

## 2023-07-18 DIAGNOSIS — Z87891 Personal history of nicotine dependence: Secondary | ICD-10-CM

## 2023-07-18 DIAGNOSIS — Z122 Encounter for screening for malignant neoplasm of respiratory organs: Secondary | ICD-10-CM

## 2023-07-18 DIAGNOSIS — F17210 Nicotine dependence, cigarettes, uncomplicated: Secondary | ICD-10-CM

## 2023-07-18 DIAGNOSIS — R911 Solitary pulmonary nodule: Secondary | ICD-10-CM | POA: Diagnosis not present

## 2023-07-22 ENCOUNTER — Other Ambulatory Visit: Payer: Self-pay | Admitting: Family Medicine

## 2023-07-24 NOTE — Telephone Encounter (Signed)
 Last office visit 03/09/2023 for cough/rash and hip pain.  Last refilled 03/21/23 for #30 with 3 refills.  Next Appt: No future appointments.

## 2023-07-28 ENCOUNTER — Telehealth: Payer: Self-pay

## 2023-07-28 DIAGNOSIS — R911 Solitary pulmonary nodule: Secondary | ICD-10-CM

## 2023-07-28 DIAGNOSIS — Z122 Encounter for screening for malignant neoplasm of respiratory organs: Secondary | ICD-10-CM

## 2023-07-28 DIAGNOSIS — Z87891 Personal history of nicotine dependence: Secondary | ICD-10-CM

## 2023-07-28 DIAGNOSIS — F17210 Nicotine dependence, cigarettes, uncomplicated: Secondary | ICD-10-CM

## 2023-07-28 NOTE — Telephone Encounter (Signed)
 Called patient and review recent Lung CT results. He is in agreement to complete a 6 month f/u scan in January 2026. No additional questions. Results and plan to PCP. Order placed.    IMPRESSION: 1. 12.9 mm right lower lobe nodule is unchanged. Lung-RADS 3, probably benign findings. Short-term follow-up in 6 months is recommended with repeat low-dose chest CT without contrast (please use the following order, CT CHEST LCS NODULE FOLLOW-UP W/O CM). 2. Cholelithiasis. 3. Aortic atherosclerosis (ICD10-I70.0). Coronary artery calcification. 4.  Emphysema (ICD10-J43.9).

## 2023-10-09 ENCOUNTER — Other Ambulatory Visit: Payer: Self-pay | Admitting: Family Medicine

## 2023-10-09 ENCOUNTER — Telehealth: Payer: Self-pay | Admitting: *Deleted

## 2023-10-09 DIAGNOSIS — Z79899 Other long term (current) drug therapy: Secondary | ICD-10-CM

## 2023-10-09 DIAGNOSIS — Z125 Encounter for screening for malignant neoplasm of prostate: Secondary | ICD-10-CM

## 2023-10-09 DIAGNOSIS — E119 Type 2 diabetes mellitus without complications: Secondary | ICD-10-CM

## 2023-10-09 DIAGNOSIS — E785 Hyperlipidemia, unspecified: Secondary | ICD-10-CM

## 2023-10-09 NOTE — Telephone Encounter (Signed)
 Please call and schedule fasting lab appointment prior to his CPE on 11/20/23.  Future orders in Epic.

## 2023-10-09 NOTE — Telephone Encounter (Signed)
 Copied from CRM #[id number]. Topic: Clinical - Request for Lab/Test Order >> Oct 09, 2023  3:41 PM Mesmerise C wrote: Reason for CRM: Patient gets labs done for his physical needs orders to be put in to schedule lab appt

## 2023-10-09 NOTE — Addendum Note (Signed)
 Addended by: Makinley Muscato E on: 10/09/2023 04:32 PM   Modules accepted: Orders

## 2023-10-10 NOTE — Telephone Encounter (Signed)
 Msg 1 left to schedule lab appointment

## 2023-10-12 ENCOUNTER — Ambulatory Visit (INDEPENDENT_AMBULATORY_CARE_PROVIDER_SITE_OTHER)

## 2023-10-12 VITALS — BP 132/68 | Ht 69.5 in | Wt 200.0 lb

## 2023-10-12 DIAGNOSIS — Z0000 Encounter for general adult medical examination without abnormal findings: Secondary | ICD-10-CM

## 2023-10-12 NOTE — Patient Instructions (Signed)
 [name],  Thank you for taking the time for your Medicare Wellness Visit. I appreciate your continued commitment to your health goals. Please review the care plan we discussed, and feel free to reach out if I can assist you further.  Medicare recommends these wellness visits once per year to help you and your care team stay ahead of potential health issues. These visits are designed to focus on prevention, allowing your provider to concentrate on managing your acute and chronic conditions during your regular appointments.  Please note that Annual Wellness Visits do not include a physical exam. Some assessments may be limited, especially if the visit was conducted virtually. If needed, we may recommend a separate in-person follow-up with your provider.  Ongoing Care Seeing your primary care provider every 3 to 6 months helps us  monitor your health and provide consistent, personalized care.   Referrals If a referral was made during today's visit and you haven't received any updates within two weeks, please contact the referred provider directly to check on the status.  Recommended Screenings:  Health Maintenance  Topic Date Due   Yearly kidney health urinalysis for diabetes  Never done   Zoster (Shingles) Vaccine (1 of 2) [birth year]1965   DTaP/Tdap/Td vaccine (2 - Td or Tdap) 01/30/2021   Complete foot exam   11/09/2022   Hemoglobin A1C  05/07/2023   COVID-19 Vaccine (4 - 2025-26 season) 09/04/2023   Yearly kidney function blood test for diabetes  11/07/2023   Medicare Annual Wellness Visit  11/14/2023   Eye exam for diabetics  05/03/2024   Pneumococcal Vaccine for age over 98  Completed   Flu Shot  Completed   Hepatitis C Screening  Completed   Meningitis B Vaccine  Aged Out   Colon Cancer Screening  Discontinued       10/12/2023    9:07 AM  Advanced Directives  Does Patient Have a Medical Advance Directive? Yes  Type of Estate agent of [place];Living will   Does patient want to make changes to medical advance directive? No - Patient declined  Copy of Healthcare Power of Attorney in Chart? No - copy requested   Advance Care Planning is important because it: Ensures you receive medical care that aligns with your values, goals, and preferences. Provides guidance to your family and loved ones, reducing the emotional burden of decision-making during critical moments.  Vision: Annual vision screenings are recommended for early detection of glaucoma, cataracts, and diabetic retinopathy. These exams can also reveal signs of chronic conditions such as diabetes and high blood pressure.  Dental: Annual dental screenings help detect early signs of oral cancer, gum disease, and other conditions linked to overall health, including heart disease and diabetes.  Please see the attached documents for additional preventive care recommendations.

## 2023-10-12 NOTE — Progress Notes (Signed)
 Because this visit was a virtual/telehealth visit,  certain criteria was not obtained, such a blood pressure, CBG if applicable, and timed get up and go. Any medications not marked as taking were not mentioned during the medication reconciliation part of the visit. Any vitals not documented were not able to be obtained due to this being a telehealth visit or patient was unable to self-report a recent blood pressure reading due to a lack of equipment at home via telehealth. Vitals that have been documented are verbally provided by the patient.  This visit was performed by a medical professional under my direct supervision. I was immediately available for consultation/collaboration. I have reviewed and agree with the Annual Wellness Visit documentation.  Subjective:   Xavier Perry is a 78 y.o. who presents for a Medicare Wellness preventive visit.  As a reminder, Annual Wellness Visits don't include a physical exam, and some assessments may be limited, especially if this visit is performed virtually. We may recommend an in-person follow-up visit with your provider if needed.  Visit Complete: Virtual I connected with  Xavier Perry on 10/12/23 by a audio enabled telemedicine application and verified that I am speaking with the correct person using two identifiers.  Patient Location: Home  Provider Location: Home Office  I discussed the limitations of evaluation and management by telemedicine. The patient expressed understanding and agreed to proceed.  Vital Signs: Because this visit was a virtual/telehealth visit, some criteria may be missing or patient reported. Any vitals not documented were not able to be obtained and vitals that have been documented are patient reported.  VideoDeclined- This patient declined Librarian, academic. Therefore the visit was completed with audio only.  Persons Participating in Visit: Patient.  AWV Questionnaire: No: Patient Medicare AWV  questionnaire was not completed prior to this visit.  Cardiac Risk Factors include: advanced age (>55men, >59 women);male gender;smoking/ tobacco exposure;hypertension;dyslipidemia     Objective:    Today's Vitals   10/12/23 0901  BP: 132/68  Weight: 200 lb (90.7 kg)  Height: 5' 9.5 (1.765 m)   Body mass index is 29.11 kg/m.     10/12/2023    9:07 AM 11/08/2021    8:33 AM 10/02/2018    9:12 AM 08/15/2017   11:26 AM 11/12/2016    1:48 PM 08/11/2016    2:50 PM 10/23/2014    2:08 PM  Advanced Directives  Does Patient Have a Medical Advance Directive? Yes Yes No Yes  No  Yes  Yes   Type of Estate agent of [place];Living will Healthcare Power of [employer] of [place];Living will  Healthcare Power of [place];Living will Healthcare Power of [place];Living will   Does patient want to make changes to medical advance directive? No - Patient declined        Copy of Healthcare Power of Attorney in Chart? No - copy requested No - copy requested  No - copy requested   No - copy requested    Would patient like information on creating a medical advance directive?   Yes (MAU/Ambulatory/Procedural Areas - Information given)  No - Patient declined        Data saved with a previous flowsheet row definition    Current Medications (verified) Outpatient Encounter Medications as of 10/12/2023  Medication Sig   aspirin  81 MG tablet Take 1 tablet (81 mg total) daily by mouth.   betamethasone  valerate ointment (VALISONE ) 0.1 % Apply 1 Application topically 2 (two) times daily.  cyanocobalamin (VITAMIN B12) 1000 MCG tablet Take 1,000 mcg by mouth daily.   doxycycline  (VIBRA -TABS) 100 MG tablet Take 1 tablet (100 mg total) by mouth 2 (two) times daily.   hydrochlorothiazide  (HYDRODIURIL ) 12.5 MG tablet TAKE 1 TABLET BY MOUTH DAILY   rosuvastatin  (CRESTOR ) 10 MG tablet TAKE 1 TABLET BY MOUTH DAILY   traMADol  (ULTRAM ) 50 MG tablet Take 1 tablet (50 mg total) by mouth  3 (three) times daily as needed for moderate pain (pain score 4-6).   triamcinolone  (KENALOG ) 0.025 % cream Apply 1 Application topically daily as needed.   No facility-administered encounter medications on file as of 10/12/2023.    Allergies (verified) Patient has no known allergies.   History: Past Medical History:  Diagnosis Date   Basal cell carcinoma    Chronic bilateral low back pain with bilateral sciatica 02/16/2017   Diabetes mellitus without complication (HCC)    diet control-no meds   GERD (gastroesophageal reflux disease) 09/19/2011   History of repair of rotator cuff 09/19/2011   L 2009 and 2010   Hypertension 08/29/2017   Personal history of colonic adenomas 08/30/2012   Squamous cell carcinoma in situ of skin 09/19/2011   TIA (transient ischemic attack) 2017   Past Surgical History:  Procedure Laterality Date   BASAL CELL CARCINOMA EXCISION     CARPAL TUNNEL RELEASE     COLONOSCOPY  08/30/2012   COLONOSCOPY WITH PROPOFOL   11/06/2014   Dr.Gessner   POLYPECTOMY     ROTATOR CUFF REPAIR Left 2009 and 2010   TONSILECTOMY, ADENOIDECTOMY, BILATERAL MYRINGOTOMY AND TUBES  1967   TOTAL HIP ARTHROPLASTY Right 01/2018   TOTAL HIP ARTHROPLASTY Left 11/2020   Family History  Problem Relation Age of Onset   Varicose Veins Mother    Cancer Father    Colon cancer Neg Hx    Esophageal cancer Neg Hx    Rectal cancer Neg Hx    Stomach cancer Neg Hx    Colon polyps Neg Hx    Social History   Socioeconomic History   Marital status: Married    Spouse name: Not on file   Number of children: Not on file   Years of education: Not on file   Highest education level: Bachelor's degree (e.g., BA, AB, BS)  Occupational History   Not on file  Tobacco Use   Smoking status: Some Days    Current packs/day: 0.25    Average packs/day: 0.3 packs/day for 50.0 years (12.5 ttl pk-yrs)    Types: Cigarettes   Smokeless tobacco: Never   Tobacco comments:    pt will discuss tobacco  cessation with PCP  Vaping Use   Vaping status: Never Used  Substance and Sexual Activity   Alcohol use: Yes    Alcohol/week: 17.0 standard drinks of alcohol    Types: 14 Shots of liquor, 3 Standard drinks or equivalent per week   Drug use: No   Sexual activity: Not Currently  Other Topics Concern   Not on file  Social History Narrative   Not on file   Social Drivers of Health   Financial Resource Strain: Low Risk  (10/12/2023)   Overall Financial Resource Strain (CARDIA)    Difficulty of Paying Living Expenses: Not hard at all  Food Insecurity: No Food Insecurity (10/12/2023)   Hunger Vital Sign    Worried About Running Out of Food in the Last Year: Never true    Ran Out of Food in the Last Year: Never true  Transportation  Needs: No Transportation Needs (10/12/2023)   PRAPARE - Administrator, Civil Service (Medical): No    Lack of Transportation (Non-Medical): No  Physical Activity: Sufficiently Active (10/12/2023)   Exercise Vital Sign    Days of Exercise per Week: 7 days    Minutes of Exercise per Session: 40 min  Stress: No Stress Concern Present (10/12/2023)   [employer] of Occupational Health - Occupational Stress Questionnaire    Feeling of Stress: Only a little  Social Connections: Socially Integrated (10/12/2023)   Social Connection and Isolation Panel    Frequency of Communication with Friends and Family: Twice a week    Frequency of Social Gatherings with Friends and Family: Three times a week    Attends Religious Services: More than 4 times per year    Active Member of Clubs or Organizations: Yes    Attends Engineer, structural: More than 4 times per year    Marital Status: Married    Tobacco Counseling Ready to quit: Not Answered Counseling given: Not Answered Tobacco comments: pt will discuss tobacco cessation with PCP    Clinical Intake:  Pre-visit preparation completed: Yes  Pain : No/denies pain     BMI - recorded:  29.11 Nutritional Status: BMI 25 -29 Overweight Nutritional Risks: None Diabetes: No  Lab Results  Component Value Date   HGBA1C 6.2 11/07/2022   HGBA1C 6.1 11/01/2021   HGBA1C 6.1 11/05/2020     How often do you need to have someone help you when you read instructions, pamphlets, or other written materials from your doctor or pharmacy?: 1 - Never  Interpreter Needed?: No  Information entered by :: Lyle Anderson LATHER   Activities of Daily Living     10/12/2023    9:06 AM 11/07/2022    4:40 PM  In your present state of health, do you have any difficulty performing the following activities:  Hearing? 0 0  Vision? 0 0  Difficulty concentrating or making decisions? 0 0  Walking or climbing stairs? 0 0  Dressing or bathing? 0 0  Doing errands, shopping? 0 0  Preparing Food and eating ? N N  Using the Toilet? N N  In the past six months, have you accidently leaked urine? N N  Do you have problems with loss of bowel control? N N  Managing your Medications? N N  Managing your Finances? N N  Housekeeping or managing your Housekeeping? N N    Patient Care Team: Watt Mirza, MD as PCP - General (Family Medicine)  I have updated your Care Teams any recent Medical Services you may have received from other providers in the past year.     Assessment:   This is a routine wellness examination for Xavier Perry.  Hearing/Vision screen Hearing Screening - Comments:: Patient has no difficulties  Vision Screening - Comments:: No difficulties   Goals Addressed             This Visit's Progress    Patient Stated       To lose some weight        Depression Screen     10/12/2023    9:08 AM 11/14/2022    8:19 AM 11/08/2021    8:38 AM 11/05/2020    8:49 AM 10/02/2018    9:12 AM 08/27/2018    2:56 PM 08/15/2017   11:51 AM  PHQ 2/9 Scores  PHQ - 2 Score 0 0 0 0 0 0 0  PHQ- 9 Score 0  0    0    Fall Risk     10/12/2023    9:07 AM 11/14/2022    8:19 AM 11/07/2022    4:40 PM  11/08/2021    8:32 AM 11/05/2020    8:49 AM  Fall Risk   Falls in the past year? 0 0 0 0 0  Number falls in past yr: 0 0 0 0   Injury with Fall? 0 0 0 0   Risk for fall due to : No Fall Risks No Fall Risks     Follow up Falls evaluation completed Falls evaluation completed  Falls evaluation completed;Education provided;Falls prevention discussed       Data saved with a previous flowsheet row definition    MEDICARE RISK AT HOME:  Medicare Risk at Home Any stairs in or around the home?: No If so, are there any without handrails?: No Home free of loose throw rugs in walkways, pet beds, electrical cords, etc?: Yes Adequate lighting in your home to reduce risk of falls?: Yes Life alert?: No Use of a cane, walker or w/c?: No Grab bars in the bathroom?: Yes Shower chair or bench in shower?: Yes Elevated toilet seat or a handicapped toilet?: Yes  TIMED UP AND GO:  Was the test performed?  No  Cognitive Function: 6CIT completed    08/15/2017   11:51 AM 08/11/2016    2:33 PM  MMSE - Mini Mental State Exam  Orientation to time 5 5   Orientation to Place 5 5   Registration 3 3   Attention/ Calculation 0 0   Recall 2 3   Recall-comments unable to recall 1 of 3 words   Language- name 2 objects 0 0   Language- repeat 1 1  Language- follow 3 step command 3 3   Language- read & follow direction 0 0   Write a sentence 0 0   Copy design 0 0   Total score 19 20      Data saved with a previous flowsheet row definition        10/12/2023    9:04 AM 11/08/2021    8:35 AM  6CIT Screen  What Year? 0 points 0 points  What month? 0 points 0 points  What time? 0 points 0 points  Count back from 20 0 points 0 points  Months in reverse 0 points 0 points  Repeat phrase 0 points 0 points  Total Score 0 points 0 points    Immunizations Immunization History  Administered Date(s) Administered   Fluad Quad(high Dose 65+) 09/19/2019, 10/20/2021   INFLUENZA, HIGH DOSE SEASONAL PF 10/14/2020,  10/13/2022, 10/07/2023   Influenza,inj,Quad PF,6+ Mos 11/09/2017, 09/25/2018   Influenza-Unspecified 11/03/2013   PFIZER(Purple Top)SARS-COV-2 Vaccination 02/12/2019, 03/05/2019, 10/09/2019   Pneumococcal Conjugate-13 07/25/2013   Pneumococcal Polysaccharide-23 07/18/2012   Tdap 01/31/2011   Zoster, Live 07/18/2012    Screening Tests Health Maintenance  Topic Date Due   Diabetic kidney evaluation - Urine ACR  Never done   Zoster Vaccines- Shingrix (1 of 2) 08/09/1965   DTaP/Tdap/Td (2 - Td or Tdap) 01/30/2021   FOOT EXAM  11/09/2022   HEMOGLOBIN A1C  05/07/2023   COVID-19 Vaccine (4 - 2025-26 season) 09/04/2023   Diabetic kidney evaluation - eGFR measurement  11/07/2023   OPHTHALMOLOGY EXAM  05/03/2024   Medicare Annual Wellness (AWV)  10/11/2024   Pneumococcal Vaccine: 50+ Years  Completed   Influenza Vaccine  Completed   Hepatitis C Screening  Completed  Meningococcal B Vaccine  Aged Out   Colonoscopy  Discontinued    Health Maintenance Items Addressed:patient declined   Additional Screening:  Vision Screening: Recommended annual ophthalmology exams for early detection of glaucoma and other disorders of the eye. Is the patient up to date with their annual eye exam?  Yes    Dental Screening: Recommended annual dental exams for proper oral hygiene  Community Resource Referral / Chronic Care Management: CRR required this visit?  No   CCM required this visit?  No   Plan:    I have personally reviewed and noted the following in the patient's chart:   Medical and social history Use of alcohol, tobacco or illicit drugs  Current medications and supplements including opioid prescriptions. Patient is not currently taking opioid prescriptions. Functional ability and status Nutritional status Physical activity Advanced directives List of other physicians Hospitalizations, surgeries, and ER visits in previous 12 months Vitals Screenings to include cognitive,  depression, and falls Referrals and appointments  In addition, I have reviewed and discussed with patient certain preventive protocols, quality metrics, and best practice recommendations. A written personalized care plan for preventive services as well as general preventive health recommendations were provided to patient.   Lyle MARLA Right, [place]   10/12/2023   After Visit Summary: (MyChart) Due to this being a telephonic visit, the after visit summary with patients personalized plan was offered to patient via MyChart   Notes: Nothing significant to report at this time.

## 2023-11-06 ENCOUNTER — Ambulatory Visit: Payer: Self-pay

## 2023-11-06 ENCOUNTER — Ambulatory Visit (INDEPENDENT_AMBULATORY_CARE_PROVIDER_SITE_OTHER): Admitting: Family

## 2023-11-06 VITALS — BP 120/80 | HR 66 | Temp 97.9°F | Ht 69.5 in | Wt 205.0 lb

## 2023-11-06 DIAGNOSIS — M62830 Muscle spasm of back: Secondary | ICD-10-CM

## 2023-11-06 DIAGNOSIS — E119 Type 2 diabetes mellitus without complications: Secondary | ICD-10-CM

## 2023-11-06 DIAGNOSIS — G5701 Lesion of sciatic nerve, right lower limb: Secondary | ICD-10-CM

## 2023-11-06 MED ORDER — PREDNISONE 10 MG (21) PO TBPK: ORAL_TABLET | ORAL | 0 refills | Status: DC

## 2023-11-06 MED ORDER — TIZANIDINE HCL 4 MG PO TABS: 4.0000 mg | ORAL_TABLET | Freq: Every day | ORAL | 0 refills | Status: DC

## 2023-11-06 NOTE — Progress Notes (Signed)
 Established Patient Office Visit  Subjective:      CC:  Chief Complaint  Patient presents with   Back Pain    Stated 11-03-23. No known injury at the time. Very active. Has spinal stenosis. States this pain is lower right side/flank pain after playing golf. Pain in right hip or upper inguinal area. States there is a knot in the back. Denies urinary frequency, urgency, or dysuria. No H/O kidney stones. Has been doing heat and ice with no relief. Daily tramadol  and Tylenol are not helping. Has used CBD cream and OTC pain cream w/o relief.     HPI: Ledarius Leeson is a 78 y.o. male presenting on 11/06/2023 for Back Pain (Stated 11-03-23. No known injury at the time. Very active. Has spinal stenosis. States this pain is lower right side/flank pain after playing golf. Pain in right hip or upper inguinal area. States there is a knot in the back. Denies urinary frequency, urgency, or dysuria. No H/O kidney stones. Has been doing heat and ice with no relief. Daily tramadol  and Tylenol are not helping. Has used CBD cream and OTC pain cream w/o relief. ) .  Discussed the use of AI scribe software for clinical note transcription with the patient, who gave verbal consent to proceed.  History of Present Illness Kriston Pasquarello is a 78 year old male with spinal stenosis who presents with right-sided lower back pain.  He experiences pain on the right side of his lower back, near the piriformis and sacroiliac joint, describing it as a 'knot right there.' The pain is aggravated by movement, particularly when getting up or bending over, and is sharp when bending forward. No flank pain or urinary changes such as frequency, urgency, or burning. No history of kidney stones.  He has a history of spinal stenosis and previously received epidural injections every three to six months, which he has not needed for the past three years. He has had both hips replaced, with the first replacement in January 2020 and the  second in 2023. The first hip replacement resulted in a leg length discrepancy, which was corrected with the second surgery.  He currently takes tramadol  and Tylenol daily for pain management and has tried CBD cream and over-the-counter pain creams without relief. He occasionally takes ibuprofen and also takes baby aspirin .  No testicular pain, swelling, or bulges in the groin area. Normal bowel movements. He has not used muscle relaxers recently and has not found heat therapy to be effective.  His diabetes is diet-controlled with a recent A1c of 6.2, and he maintains a good diet. He has a history of hypertension and cholesterol issues, which are also diet-controlled.  He plays golf three times a week and is very active.  Lab Results  Component Value Date   HGBA1C 6.2 11/07/2022           Social history:  Relevant past medical, surgical, family and social history reviewed and updated as indicated. Interim medical history since our last visit reviewed.  Allergies and medications reviewed and updated.  DATA REVIEWED: CHART IN EPIC     ROS: Negative unless specifically indicated above in HPI.    Current Outpatient Medications:    aspirin  81 MG tablet, Take 1 tablet (81 mg total) daily by mouth., Disp: 30 tablet, Rfl: 0   cyanocobalamin (VITAMIN B12) 1000 MCG tablet, Take 1,000 mcg by mouth daily., Disp: , Rfl:    hydrochlorothiazide  (HYDRODIURIL ) 12.5 MG tablet, TAKE 1 TABLET BY MOUTH  DAILY, Disp: 90 tablet, Rfl: 3   rosuvastatin  (CRESTOR ) 10 MG tablet, TAKE 1 TABLET BY MOUTH DAILY, Disp: 90 tablet, Rfl: 3   traMADol  (ULTRAM ) 50 MG tablet, Take 1 tablet (50 mg total) by mouth 3 (three) times daily as needed for moderate pain (pain score 4-6)., Disp: 30 tablet, Rfl: 3   triamcinolone  (KENALOG ) 0.025 % cream, Apply 1 Application topically daily as needed., Disp: , Rfl:         Objective:        BP 120/80 (BP Location: Left Arm, Patient Position: Sitting, Cuff Size:  Large)   Pulse 66   Temp 97.9 F (36.6 C) (Oral)   Ht 5' 9.5 (1.765 m)   Wt 205 lb (93 kg)   SpO2 96%   BMI 29.84 kg/m   Physical Exam MUSCULOSKELETAL: Pain in lower back on leg lift. Pain on bending over. Tenderness in hip region. Tenderness in right hip region.  Wt Readings from Last 3 Encounters:  11/06/23 205 lb (93 kg)  10/12/23 200 lb (90.7 kg)  03/09/23 199 lb 8 oz (90.5 kg)    Physical Exam Constitutional:      General: He is not in acute distress.    Appearance: Normal appearance. He is normal weight. He is not ill-appearing, toxic-appearing or diaphoretic.  Cardiovascular:     Rate and Rhythm: Normal rate.  Pulmonary:     Effort: Pulmonary effort is normal.  Musculoskeletal:     Lumbar back: Positive right straight leg raise test.     Comments: Piriformis muscle tenderness on palpation  Positive straight leg test right side No iliac crest tenderness no bony tenderness at hip.  Some pain with external rotation  No palpable groin tenderness   Neurological:     General: No focal deficit present.     Mental Status: He is alert and oriented to person, place, and time. Mental status is at baseline.  Psychiatric:        Mood and Affect: Mood normal.        Behavior: Behavior normal.        Thought Content: Thought content normal.        Judgment: Judgment normal.          Results LABS A1c: 6.1  Assessment & Plan:   Assessment and Plan Assessment & Plan Piriformis syndrome Chronic piriformis syndrome with pain localized to the right side near the piriformis and SI joint, exacerbated by movement, particularly golfing. No flank pain, urinary changes, or testicular pain. Pain is not relieved by current medications including tramadol , Tylenol, CBD cream, and over-the-counter pain cream. No history of muscle relaxer use. Physical examination reveals tightness in the piriformis area without bony hip pain. Differential diagnosis includes sciatica, but symptoms  are consistent with piriformis syndrome. - Prescribed prednisone  to reduce inflammation. - Prescribed a muscle relaxer for nighttime use if needed. - Recommended massage and heat application to the affected area. - Provided exercises specific to piriformis syndrome. - Advised taking a break from golf for one to two weeks. - Instructed to avoid ibuprofen while taking prednisone  due to anti-inflammatory effects.  Lumbar spinal stenosis Chronic lumbar spinal stenosis with previous epidural injections, last received three years ago. No current need for epidural injections.  Type 2 diabetes mellitus, diet controlled Type 2 diabetes mellitus well-controlled with diet. Recent A1c was 6.2, consistent with previous levels. Prednisone  may temporarily increase A1c, but current levels are not concerning. - Continue current diet control for diabetes management.  Return for f/u with Dr. Watt as scheduled .     Ginger Patrick, MSN, APRN, FNP-C [facility]

## 2023-11-06 NOTE — Telephone Encounter (Signed)
 FYI Only or Action Required?: FYI only for provider: appointment scheduled on 11/3.  Patient was last seen in primary care on 03/09/2023 by Watt Mirza, MD.  Called Nurse Triage reporting Back Pain.  Symptoms began several days ago.  Interventions attempted: OTC medications: Tylenol/Tramadol .  Symptoms are: gradually worsening.  Triage Disposition: See PCP When Office is Open (Within 3 Days)  Patient/caregiver understands and will follow disposition?: Yes              Copied from CRM #[id number]. Topic: Clinical - Red Word Triage >> Nov 06, 2023 11:39 AM Donna BRAVO wrote: Red Word that prompted transfer to Nurse Triage: Patient calling asking about appt. Patient stated the person he spoke to this morning scheduled the appt for today 11/06/23 at 3:00pm   patient wife also heard the appt was scheduled for today.   The appt is scheduled for 11/07/23 at 3:00pm      patient is in a lot of pain, lower back pain and want's to see someone today  Pain has gotten worse over the weekend. Reason for Disposition  [1] MODERATE back pain (e.g., interferes with normal activities) AND [2] present > 3 days  Answer Assessment - Initial Assessment Questions 1. ONSET: When did the pain begin? (e.g., minutes, hours, days)     Last Friday   2. LOCATION: Where does it hurt? (upper, mid or lower back)     Lower right side   3. SEVERITY: How bad is the pain?  (e.g., Scale 1-10; mild, moderate, or severe)     7/10  4. PATTERN: Is the pain constant? (e.g., yes, no; constant, intermittent)      Constant   5. RADIATION: Does the pain shoot into your legs or somewhere else?     right groin area    6. CAUSE:  What do you think is causing the back pain?      Unsure   7. BACK OVERUSE:  Any recent lifting of heavy objects, strenuous work or exercise?     No   8. MEDICINES: What have you taken so far for the pain? (e.g., nothing, acetaminophen, NSAIDS)     Tylenol   9.  NEUROLOGIC SYMPTOMS: Do you have any weakness, numbness, or problems with bowel/bladder control?     No   10. OTHER SYMPTOMS: Do you have any other symptoms? (e.g., fever, abdomen pain, burning with urination, blood in urine) No   Patient would like to follow up with provider regarding the left lower back pain that has been ongoing for a few days. Appt. Scheduled for 11/3  Protocols used: Back Pain-A-AH

## 2023-11-07 ENCOUNTER — Ambulatory Visit: Admitting: Family Medicine

## 2023-11-09 ENCOUNTER — Other Ambulatory Visit (INDEPENDENT_AMBULATORY_CARE_PROVIDER_SITE_OTHER)

## 2023-11-09 DIAGNOSIS — Z125 Encounter for screening for malignant neoplasm of prostate: Secondary | ICD-10-CM

## 2023-11-09 DIAGNOSIS — E119 Type 2 diabetes mellitus without complications: Secondary | ICD-10-CM

## 2023-11-09 DIAGNOSIS — E785 Hyperlipidemia, unspecified: Secondary | ICD-10-CM

## 2023-11-09 DIAGNOSIS — Z79899 Other long term (current) drug therapy: Secondary | ICD-10-CM

## 2023-11-09 LAB — BASIC METABOLIC PANEL WITH GFR
BUN: 17 mg/dL (ref 6–23)
CO2: 30 meq/L (ref 19–32)
Calcium: 9 mg/dL (ref 8.4–10.5)
Chloride: 101 meq/L (ref 96–112)
Creatinine, Ser: 0.84 mg/dL (ref 0.40–1.50)
GFR: 84.27 mL/min (ref >60.00)
Glucose, Bld: 102 mg/dL — ABNORMAL HIGH (ref 70–99)
Potassium: 3.8 meq/L (ref 3.5–5.1)
Sodium: 139 meq/L (ref 135–145)

## 2023-11-09 LAB — HEMOGLOBIN A1C: Hgb A1c MFr Bld: 6 % (ref 4.6–6.5)

## 2023-11-09 LAB — MICROALBUMIN / CREATININE URINE RATIO
Creatinine,U: 143.8 mg/dL
Microalb Creat Ratio: 9.4 mg/g (ref 0.0–30.0)
Microalb, Ur: 1.4 mg/dL (ref 0.0–1.9)

## 2023-11-09 LAB — CBC WITH DIFFERENTIAL/PLATELET
Basophils Absolute: 0 K/uL (ref 0.0–0.1)
Basophils Relative: 0.4 % (ref 0.0–3.0)
Eosinophils Absolute: 0 K/uL (ref 0.0–0.7)
Eosinophils Relative: 0.1 % (ref 0.0–5.0)
HCT: 39.1 % (ref 39.0–52.0)
Hemoglobin: 13.3 g/dL (ref 13.0–17.0)
Lymphocytes Relative: 21.3 % (ref 12.0–46.0)
Lymphs Abs: 2.3 K/uL (ref 0.7–4.0)
MCHC: 33.9 g/dL (ref 30.0–36.0)
MCV: 91.9 fl (ref 78.0–100.0)
Monocytes Absolute: 0.6 K/uL (ref 0.1–1.0)
Monocytes Relative: 5.6 % (ref 3.0–12.0)
Neutro Abs: 8 K/uL — ABNORMAL HIGH (ref 1.4–7.7)
Neutrophils Relative %: 72.6 % (ref 43.0–77.0)
Platelets: 228 K/uL (ref 150.0–400.0)
RBC: 4.25 Mil/uL (ref 4.22–5.81)
RDW: 12.7 % (ref 11.5–15.5)
WBC: 10.9 K/uL — ABNORMAL HIGH (ref 4.0–10.5)

## 2023-11-09 LAB — PSA, MEDICARE: PSA: 0.28 ng/mL (ref 0.10–4.00)

## 2023-11-09 LAB — LIPID PANEL
Cholesterol: 163 mg/dL (ref 0–200)
HDL: 87.6 mg/dL (ref >39.00)
LDL Cholesterol: 60 mg/dL (ref 0–99)
NonHDL: 75.28
Total CHOL/HDL Ratio: 2
Triglycerides: 75 mg/dL (ref 0.0–149.0)
VLDL: 15 mg/dL (ref 0.0–40.0)

## 2023-11-09 LAB — HEPATIC FUNCTION PANEL
ALT: 31 U/L (ref 0–53)
AST: 27 U/L (ref 0–37)
Albumin: 4.2 g/dL (ref 3.5–5.2)
Alkaline Phosphatase: 63 U/L (ref 39–117)
Bilirubin, Direct: 0.1 mg/dL (ref 0.0–0.3)
Total Bilirubin: 0.5 mg/dL (ref 0.2–1.2)
Total Protein: 7 g/dL (ref 6.0–8.3)

## 2023-11-19 ENCOUNTER — Encounter: Payer: Self-pay | Admitting: Family Medicine

## 2023-11-19 NOTE — Progress Notes (Signed)
 [name] T. Ardelia Wrede, MD, CAQ Sports Medicine [facility] at [facility] [address] [place], [address]  Phone: [phone]  FAX: [phone]  Xavier Perry - 78 y.o. male  MRN [id number]  Date of Birth: 1945-03-05  Date: 11/20/2023  PCP: Watt Mirza, MD  Referral: Watt Mirza, MD  Chief Complaint  Patient presents with   Annual Exam    Part 2 MWV 10/12/23   Patient Care Team: Watt Mirza, MD as PCP - General (Family Medicine) Subjective:   Xavier Perry is a 78 y.o. pleasant patient who presents with the following:  Discussed the use of AI scribe software for clinical note transcription with the patient, who gave verbal consent to proceed. History of Present Illness Xavier Perry is a 78 year old male who presents for a general checkup.  He quit smoking in June 2025 after over forty years of smoking. A previously identified lung nodule has decreased in size on follow-up imaging, and he is scheduled for another chest CT in January 2026 to monitor the nodule. He has a history of COPD and emphysema due to long-term smoking, with no recent exacerbations or respiratory symptoms. No issues with breathing reported.  His diabetes is well-controlled, with optimal blood sugar levels. Kidney function is normal, and blood pressure is well-managed at 120/70 mmHg.  He engages in regular physical activity, playing golf twice a week and walking his dog daily. He also volunteers at his church, helping to set up for events, which involves physical activity.  He has a history of skin issues and sees a dermatologist once or twice a year. He uses a cream prescribed for precancerous lesions, which he applies annually. No recent skin cancers, with the last one being removed from his ankle.  No urinary issues, typically gets up once a night to urinate, which he considers normal. He reports getting adequate sleep, going to bed around 10 PM and waking up around  6:30 AM.  Preventative Health Maintenance Visit:  Health Maintenance Summary Reviewed and updated, unless pt declines services.  Tobacco History Reviewed. Alcohol: No concerns, no excessive use Exercise Habits: Some activity, rec at least 30 mins 5 times a week STD concerns: no risk or activity to increase risk Drug Use: None  Shingrix Td Foot exam COVID booster  Health Maintenance  Topic Date Due   Zoster Vaccines- Shingrix (1 of 2) 08/09/1964   DTaP/Tdap/Td (2 - Td or Tdap) 01/30/2021   FOOT EXAM  11/09/2022   COVID-19 Vaccine (4 - 2025-26 season) 09/04/2023   OPHTHALMOLOGY EXAM  05/03/2024   HEMOGLOBIN A1C  05/08/2024   Medicare Annual Wellness (AWV)  10/11/2024   Diabetic kidney evaluation - eGFR measurement  11/08/2024   Diabetic kidney evaluation - Urine ACR  11/08/2024   Pneumococcal Vaccine: 50+ Years  Completed   Influenza Vaccine  Completed   Hepatitis C Screening  Completed   Meningococcal B Vaccine  Aged Out   Colonoscopy  Discontinued   Immunization History  Administered Date(s) Administered   Fluad Quad(high Dose 65+) 09/19/2019, 10/20/2021   INFLUENZA, HIGH DOSE SEASONAL PF 10/14/2020, 10/13/2022, 10/07/2023   Influenza,inj,Quad PF,6+ Mos 11/09/2017, 09/25/2018   Influenza-Unspecified 11/03/2013   PFIZER(Purple Top)SARS-COV-2 Vaccination 02/12/2019, 03/05/2019, 10/09/2019   Pneumococcal Conjugate-13 07/25/2013   Pneumococcal Polysaccharide-23 07/18/2012   Tdap 01/31/2011   Zoster, Live 07/18/2012   Patient Active Problem List   Diagnosis Date Noted   Diet-controlled diabetes mellitus (HCC) 11/22/2018    Priority: High  TIA (transient ischemic attack) 11/15/2016    Priority: High   Hypertension 08/29/2017    Priority: Medium    Hyperlipidemia LDL goal <70 02/15/2017    Priority: Medium    Centrilobular emphysema (HCC) 11/20/2023   Severe multilevel lumbar spinal stenosis 10/23/2019   History of colonic polyps 08/30/2012   GERD  (gastroesophageal reflux disease) 09/19/2011    Past Medical History:  Diagnosis Date   Basal cell carcinoma    Chronic bilateral low back pain with bilateral sciatica 02/16/2017   Diabetes mellitus without complication (HCC)    GERD (gastroesophageal reflux disease) 09/19/2011   History of repair of rotator cuff 09/19/2011   L 2009 and 2010   Hypertension 08/29/2017   Personal history of colonic adenomas 08/30/2012   Squamous cell carcinoma in situ of skin 09/19/2011   TIA (transient ischemic attack) 2017    Past Surgical History:  Procedure Laterality Date   BASAL CELL CARCINOMA EXCISION     CARPAL TUNNEL RELEASE     COLONOSCOPY  08/30/2012   COLONOSCOPY WITH PROPOFOL   11/06/2014   Dr.Gessner   POLYPECTOMY     ROTATOR CUFF REPAIR Left 2009 and 2010   TONSILECTOMY, ADENOIDECTOMY, BILATERAL MYRINGOTOMY AND TUBES  1967   TOTAL HIP ARTHROPLASTY Right 01/2018   TOTAL HIP ARTHROPLASTY Left 11/2020    Family History  Problem Relation Age of Onset   Varicose Veins Mother    Cancer Father    Colon cancer Neg Hx    Esophageal cancer Neg Hx    Rectal cancer Neg Hx    Stomach cancer Neg Hx    Colon polyps Neg Hx     Social History   Social History Narrative   Not on file    Past Medical History, Surgical History, Social History, Family History, Problem List, Medications, and Allergies have been reviewed and updated if relevant.  Review of Systems: Pertinent positives are listed above.  Otherwise, a full 14 point review of systems has been done in full and it is negative except where it is noted positive.  Objective:   BP 120/70   Pulse 71   Temp 97.8 F (36.6 C) (Temporal)   Ht 5' 9 (1.753 m)   Wt 201 lb 8 oz (91.4 kg)   SpO2 97%   BMI 29.76 kg/m  Ideal Body Weight: Weight in (lb) to have BMI = 25: 168.9  Ideal Body Weight: Weight in (lb) to have BMI = 25: 168.9 No results found.    10/12/2023    9:08 AM 11/14/2022    8:19 AM 11/08/2021    8:38 AM 11/05/2020     8:49 AM 10/02/2018    9:12 AM  Depression screen PHQ 2/9  Decreased Interest 0 0 0 0 0  Down, Depressed, Hopeless 0 0 0 0 0  PHQ - 2 Score 0 0 0 0 0  Altered sleeping 0  0    Tired, decreased energy 0  0    Change in appetite 0  0    Feeling bad or failure about yourself  0  0    Trouble concentrating 0  0    Moving slowly or fidgety/restless 0  0    Suicidal thoughts 0  0    PHQ-9 Score 0   0     Difficult doing work/chores Not difficult at all  Not difficult at all       Data saved with a previous flowsheet row definition     GEN: well  developed, well nourished, no acute distress Eyes: conjunctiva and lids normal, PERRLA, EOMI ENT: TM clear, nares clear, oral exam WNL Neck: supple, no lymphadenopathy, no thyromegaly, no JVD Pulm: clear to auscultation and percussion, respiratory effort normal CV: regular rate and rhythm, S1-S2, no murmur, rub or gallop, no bruits, peripheral pulses normal and symmetric, no cyanosis, clubbing, edema or varicosities GI: soft, non-tender; no hepatosplenomegaly, masses; active bowel sounds all quadrants GU: deferred Lymph: no cervical, axillary or inguinal adenopathy MSK: gait normal, muscle tone and strength WNL, no joint swelling, effusions, discoloration, crepitus  SKIN: clear, good turgor, color WNL, no rashes, lesions, or ulcerations Neuro: normal mental status, normal strength, sensation, and motion Psych: alert; oriented to person, place and time, normally interactive and not anxious or depressed in appearance.  All labs reviewed with patient. Results for orders placed or performed in visit on 11/09/23  Basic metabolic panel   Collection Time: 11/09/23  7:47 AM  Result Value Ref Range   Sodium 139 135 - 145 mEq/L   Potassium 3.8 3.5 - 5.1 mEq/L   Chloride 101 96 - 112 mEq/L   CO2 30 19 - 32 mEq/L   Glucose, Bld 102 (H) 70 - 99 mg/dL   BUN 17 6 - 23 mg/dL   Creatinine, Ser 9.15 0.40 - 1.50 mg/dL   GFR 15.72 >39.99 mL/min    Calcium  9.0 8.4 - 10.5 mg/dL  Microalbumin / creatinine urine ratio   Collection Time: 11/09/23  7:47 AM  Result Value Ref Range   Microalb, Ur 1.4 0.0 - 1.9 mg/dL   Creatinine,U 856.1 mg/dL   Microalb Creat Ratio 9.4 0.0 - 30.0 mg/g  PSA, Medicare   Collection Time: 11/09/23  7:47 AM  Result Value Ref Range   PSA 0.28 0.10 - 4.00 ng/ml  Hepatic Function Panel   Collection Time: 11/09/23  7:47 AM  Result Value Ref Range   Total Bilirubin 0.5 0.2 - 1.2 mg/dL   Bilirubin, Direct 0.1 0.0 - 0.3 mg/dL   Alkaline Phosphatase 63 39 - 117 U/L   AST 27 0 - 37 U/L   ALT 31 0 - 53 U/L   Total Protein 7.0 6.0 - 8.3 g/dL   Albumin 4.2 3.5 - 5.2 g/dL  CBC with Differential/Platelet   Collection Time: 11/09/23  7:47 AM  Result Value Ref Range   WBC 10.9 (H) 4.0 - 10.5 K/uL   RBC 4.25 4.22 - 5.81 Mil/uL   Hemoglobin 13.3 13.0 - 17.0 g/dL   HCT 60.8 60.9 - 47.9 %   MCV 91.9 78.0 - 100.0 fl   MCHC 33.9 30.0 - 36.0 g/dL   RDW 87.2 88.4 - 84.4 %   Platelets 228.0 150.0 - 400.0 K/uL   Neutrophils Relative % 72.6 43.0 - 77.0 %   Lymphocytes Relative 21.3 12.0 - 46.0 %   Monocytes Relative 5.6 3.0 - 12.0 %   Eosinophils Relative 0.1 0.0 - 5.0 %   Basophils Relative 0.4 0.0 - 3.0 %   Neutro Abs 8.0 (H) 1.4 - 7.7 K/uL   Lymphs Abs 2.3 0.7 - 4.0 K/uL   Monocytes Absolute 0.6 0.1 - 1.0 K/uL   Eosinophils Absolute 0.0 0.0 - 0.7 K/uL   Basophils Absolute 0.0 0.0 - 0.1 K/uL  Lipid panel   Collection Time: 11/09/23  7:47 AM  Result Value Ref Range   Cholesterol 163 0 - 200 mg/dL   Triglycerides 24.9 0.0 - 149.0 mg/dL   HDL 12.39 >60.99 mg/dL   VLDL  15.0 0.0 - 40.0 mg/dL   LDL Cholesterol 60 0 - 99 mg/dL   Total CHOL/HDL Ratio 2    NonHDL 75.28   Hemoglobin A1c   Collection Time: 11/09/23  7:47 AM  Result Value Ref Range   Hgb A1c MFr Bld 6.0 4.6 - 6.5 %    Assessment and Plan:     ICD-10-CM   1. Healthcare maintenance  Z00.00     2. Centrilobular emphysema (HCC)  J43.2       Assessment & Plan Adult Wellness Visit Routine wellness visit with no acute concerns. Blood pressure well controlled. Engages in regular physical activity. Overall in good health for age. - Continue current exercise regimen. - Maintain healthy lifestyle.  Centrilobular emphysema and pulmonary nodule under surveillance Centrilobular emphysema likely due to chronic smoking. Pulmonary nodule has shown shrinkage on follow-up imaging. - Routine f/u lung CT planned in 01/2024.  Documentation error In the patient's problem list, someone who appears to be a non-clinician placed a diagnosis of aortic aneurysm, but I have reviewed all imaging going back years, and there is no aortic aneurysm.  I called the patient, LMOM, and sent him a mychart message.  I am removing that from his problem list.  Type 2 diabetes mellitus, well controlled Type 2 diabetes mellitus well controlled. Blood sugar levels stable and kidney function normal. - Continue current diabetes management plan.  Hypertension, well controlled Hypertension well controlled with current medication. Blood pressure 120/70 mmHg. - Continue current antihypertensive medication regimen.  Actinic keratosis and precancerous skin lesions under dermatology care Multiple actinic keratoses and precancerous skin lesions present. Under regular dermatology care with topical treatments. No recent skin cancers reported. - Continue regular dermatology follow-ups.  Health Maintenance Exam: The patient's preventative maintenance and recommended screening tests for an annual wellness exam were reviewed in full today. Brought up to date unless services declined.  Counselled on the importance of diet, exercise, and its role in overall health and mortality. The patient's FH and SH was reviewed, including their home life, tobacco status, and drug and alcohol status.  Follow-up in 1 year for physical exam or additional follow-up below.  Disposition: No  follow-ups on file.  No orders of the defined types were placed in this encounter.  Medications Discontinued During This Encounter  Medication Reason   predniSONE  (STERAPRED UNI-PAK 21 TAB) 10 MG (21) TBPK tablet    tiZANidine  (ZANAFLEX ) 4 MG tablet Completed Course   No orders of the defined types were placed in this encounter.   Signed,  Jacques DASEN. Laquita Harlan, MD   Allergies as of 11/20/2023   No Known Allergies      Medication List        Accurate as of November 20, 2023 11:59 PM. If you have any questions, ask your nurse or doctor.          STOP taking these medications    predniSONE  10 MG (21) Tbpk tablet Commonly known as: STERAPRED UNI-PAK 21 TAB Stopped by: Jacques Pankaj Haack   tiZANidine  4 MG tablet Commonly known as: Zanaflex  Stopped by: Jacques Velva Molinari       TAKE these medications    aspirin  81 MG tablet Take 1 tablet (81 mg total) daily by mouth.   cyanocobalamin 1000 MCG tablet Commonly known as: VITAMIN B12 Take 1,000 mcg by mouth daily.   hydrochlorothiazide  12.5 MG tablet Commonly known as: HYDRODIURIL  TAKE 1 TABLET BY MOUTH DAILY   rosuvastatin  10 MG tablet Commonly known as: CRESTOR  TAKE 1  TABLET BY MOUTH DAILY   traMADol  50 MG tablet Commonly known as: ULTRAM  Take 1 tablet (50 mg total) by mouth every 6 (six) hours as needed for moderate pain (pain score 4-6). What changed: when to take this Changed by: Jacques Khoury Siemon   triamcinolone  0.025 % cream Commonly known as: KENALOG  Apply 1 Application topically daily as needed.

## 2023-11-20 ENCOUNTER — Ambulatory Visit (INDEPENDENT_AMBULATORY_CARE_PROVIDER_SITE_OTHER): Admitting: Family Medicine

## 2023-11-20 ENCOUNTER — Other Ambulatory Visit: Payer: Self-pay | Admitting: Family Medicine

## 2023-11-20 ENCOUNTER — Encounter: Payer: Self-pay | Admitting: Family Medicine

## 2023-11-20 VITALS — BP 120/70 | HR 71 | Temp 97.8°F | Ht 69.0 in | Wt 201.5 lb

## 2023-11-20 DIAGNOSIS — Z0000 Encounter for general adult medical examination without abnormal findings: Secondary | ICD-10-CM | POA: Diagnosis not present

## 2023-11-20 DIAGNOSIS — J432 Centrilobular emphysema: Secondary | ICD-10-CM | POA: Insufficient documentation

## 2023-11-20 NOTE — Patient Instructions (Signed)
Tetanus booster  Shingrix (shingles vaccine)

## 2023-11-20 NOTE — Telephone Encounter (Signed)
 Last office visit 11/20/23 for CPE.  Last refilled 07/24/2023 for #30 with 3 refills.  Next appt: No future appointments.

## 2023-11-23 NOTE — Progress Notes (Signed)
 Xavier Perry                                          MRN: [id number]   11/23/2023   The [facility] Quality Team Specialist reviewed this patient medical record for the purposes of chart review for care gap closure. The following were reviewed: abstraction for care gap closure-kidney health evaluation for diabetes:eGFR  and uACR.    [facility] Quality Team

## 2023-11-27 ENCOUNTER — Encounter: Payer: Self-pay | Admitting: Acute Care

## 2023-11-28 ENCOUNTER — Encounter: Payer: Self-pay | Admitting: Family Medicine

## 2023-12-12 DIAGNOSIS — M25551 Pain in right hip: Secondary | ICD-10-CM | POA: Diagnosis not present

## 2023-12-12 DIAGNOSIS — M7611 Psoas tendinitis, right hip: Secondary | ICD-10-CM | POA: Diagnosis not present

## 2023-12-12 DIAGNOSIS — Z96641 Presence of right artificial hip joint: Secondary | ICD-10-CM | POA: Diagnosis not present

## 2024-01-20 ENCOUNTER — Other Ambulatory Visit: Payer: Self-pay | Admitting: Family Medicine

## 2024-01-23 ENCOUNTER — Ambulatory Visit
Admission: RE | Admit: 2024-01-23 | Discharge: 2024-01-23 | Disposition: A | Source: Ambulatory Visit | Attending: Acute Care | Admitting: Acute Care

## 2024-01-23 DIAGNOSIS — F17210 Nicotine dependence, cigarettes, uncomplicated: Secondary | ICD-10-CM

## 2024-01-23 DIAGNOSIS — Z122 Encounter for screening for malignant neoplasm of respiratory organs: Secondary | ICD-10-CM

## 2024-01-23 DIAGNOSIS — R911 Solitary pulmonary nodule: Secondary | ICD-10-CM

## 2024-01-23 DIAGNOSIS — Z87891 Personal history of nicotine dependence: Secondary | ICD-10-CM

## 2024-01-31 ENCOUNTER — Ambulatory Visit: Payer: Self-pay

## 2024-01-31 NOTE — Telephone Encounter (Signed)
 FYI Only or Action Required?: FYI only for provider: appointment scheduled on 1/29.  Patient was last seen in primary care on 11/20/2023 by Watt Mirza, MD.  Called Nurse Triage reporting Cough.  Symptoms began several weeks ago.  Interventions attempted: OTC medications: cough drops, nasal sprays.  Symptoms are: gradually worsening.  Triage Disposition: See Physician Within 24 Hours  Patient/caregiver understands and will follow disposition?: Yes      Reason for Triage:  cough for 2 weeks  cough getting worse in the evening  wake up coughing and short of breath  CT 01/29/24  Emphysema  when coughing notices head hurts  chest feels tight sometimes  Reason for Disposition  SEVERE coughing spells (e.g., whooping sound after coughing, vomiting after coughing)  Answer Assessment - Initial Assessment Questions 1. ONSET: When did the cough begin?      2 weeks  2. SEVERITY: How bad is the cough today?      Worse at night  3. SPUTUM: Describe the color of your sputum (e.g., none, dry cough; clear, white, yellow, Vanmeter)     Clear to light yellow color   4. HEMOPTYSIS: Are you coughing up any blood? If so ask: How much? (e.g., flecks, streaks, tablespoons, etc.)     Denies  5. DIFFICULTY BREATHING: Are you having difficulty breathing? If Yes, ask: How bad is it? (e.g., mild, moderate, severe)      Some shortness of breath, some chest congestion  6. FEVER: Do you have a fever? If Yes, ask: What is your temperature, how was it measured, and when did it start?     Denies   8. LUNG HISTORY: Do you have any history of lung disease?  (e.g., pulmonary embolus, asthma, emphysema)     Emphysema per CT 01/23/24   10. OTHER SYMPTOMS: Do you have any other symptoms? (e.g., runny nose, wheezing, chest pain) HA when he coughs  Taking cough syrup, nose spray, and cough drops  Protocols used: Cough - Chronic-A-AH

## 2024-02-01 ENCOUNTER — Ambulatory Visit: Admitting: Family Medicine

## 2024-02-01 ENCOUNTER — Encounter: Payer: Self-pay | Admitting: Family Medicine

## 2024-02-01 VITALS — BP 122/86 | HR 60 | Temp 97.8°F | Ht 69.5 in | Wt 205.1 lb

## 2024-02-01 DIAGNOSIS — J208 Acute bronchitis due to other specified organisms: Secondary | ICD-10-CM | POA: Diagnosis not present

## 2024-02-01 DIAGNOSIS — J432 Centrilobular emphysema: Secondary | ICD-10-CM | POA: Diagnosis not present

## 2024-02-01 MED ORDER — ALBUTEROL SULFATE HFA 108 (90 BASE) MCG/ACT IN AERS: 2.0000 | INHALATION_SPRAY | RESPIRATORY_TRACT | 3 refills | Status: AC | PRN

## 2024-02-01 MED ORDER — FLUTICASONE-SALMETEROL 250-50 MCG/ACT IN AEPB: 1.0000 | INHALATION_SPRAY | Freq: Two times a day (BID) | RESPIRATORY_TRACT | 3 refills | Status: AC

## 2024-02-01 MED ORDER — DOXYCYCLINE HYCLATE 100 MG PO TABS: 100.0000 mg | ORAL_TABLET | Freq: Two times a day (BID) | ORAL | 0 refills | Status: AC

## 2024-02-01 NOTE — Progress Notes (Signed)
 "    Auriel Kist T. Javier Gell, MD, CAQ Sports Medicine [facility] [address] [place], [address]  Phone: [phone]  FAX: [phone]  Xavier Perry - 79 y.o. male  MRN [id number]  Date of Birth: March 05, 1945  Date: 02/01/2024  PCP: Watt Mirza, MD  Referral: Watt Mirza, MD  Chief Complaint  Patient presents with   Cough    C/o cough. Denies any other sxs. Started 2 wks ago. Recently dx, emphysema.    Subjective:   Xavier Perry is a 79 y.o. very pleasant male patient with Body mass index is 29.86 kg/m. who presents with the following:  Discussed the use of AI scribe software for clinical note transcription with the patient, who gave verbal consent to proceed.   History of Present Illness Xavier Perry is a 79 year old male with emphysema who presents with a persistent cough and shortness of breath.  He has experienced a persistent cough mainly at night for about two weeks. He goes to bed around 10:00 to 10:30 PM and wakes up around 1:00 AM with a cough that keeps him awake for about an hour. The cough is mostly non-productive, occasionally producing minimal sputum, and has worsened over the past two weeks.  No fever, chills, or flu-like symptoms. He recalls experiencing similar symptoms around January of the previous year. He feels short of breath, particularly during physical activities such as cutting grass or playing golf, and this has been gradually worsening over the past year.  He has a significant smoking history, having smoked about a pack a day for fifty years, but he quit several months ago. He has undergone three CT scans to monitor a lung nodule.  He is currently not using any inhalers and has not used a rescue inhaler before.   Review of Systems is noted in the HPI, as appropriate  Objective:   BP 122/86   Pulse 60   Temp 97.8 F (36.6 C) (Oral)   Ht 5' 9.5 (1.765 m)   Wt 205 lb 2 oz (93 kg)   SpO2 98%    BMI 29.86 kg/m    Gen: WDWN, NAD. Globally Non-toxic HEENT: Throat clear, w/o exudate, R TM clear, L TM - good landmarks, No fluid present. No rhinnorhea.  MMM Frontal sinuses: NT Max sinuses: NT NECK: Anterior cervical  LAD is absent CV: RRR, No M/G/R, cap refill <2 sec PULM: Breathing comfortably in no respiratory distress. no wheezing, crackles, rhonchi   Laboratory and Imaging Data:  Assessment and Plan:     ICD-10-CM   1. Centrilobular emphysema (HCC)  J43.2     2. Acute bronchitis due to other specified organisms  J20.8      Assessment & Plan Acute bronchitis Cough and shortness of breath, likely viral with risk of bacterial infection due to COPD. - Prescribed Advair inhaler twice daily. - Prescribed rescue albuterol  inhaler as needed. - Prescribed doxycycline  twice daily. - Advised to rinse mouth after using Advair. - Instructed to send MyChart message in one month to report inhaler effectiveness.  Centrilobular emphysema Chronic condition with exacerbation, likely due to smoking history. No change in lung nodule on CT. - Continue regular CT scans for lung cancer screening. - Discussed potential pulmonologist referral if symptoms persist. - Start BID Advair, and he will send me a message to let know how he is improving after starting scheduled inhaler  Medication Management during today's office visit: Meds ordered this encounter  Medications   fluticasone -salmeterol (ADVAIR) 250-50 MCG/ACT AEPB    Sig: Inhale 1 puff into the lungs in the morning and at bedtime.    Dispense:  60 each    Refill:  3   albuterol  (VENTOLIN  HFA) 108 (90 Base) MCG/ACT inhaler    Sig: Inhale 2 puffs into the lungs every 4 (four) hours as needed for wheezing or shortness of breath.    Dispense:  8 g    Refill:  3   doxycycline  (VIBRA -TABS) 100 MG tablet    Sig: Take 1 tablet (100 mg total) by mouth 2 (two) times daily.    Dispense:  20 tablet    Refill:  0   There are no  discontinued medications.  Orders placed today for conditions managed today: No orders of the defined types were placed in this encounter.   Disposition: No follow-ups on file.  [employer] speech-to-text software was used for transcription in this dictation.  Possible transcriptional errors can occur using Animal nutritionist.   Signed,  Xavier Perry. Xavier Dockstader, MD   Outpatient Encounter Medications as of 02/01/2024  Medication Sig   albuterol  (VENTOLIN  HFA) 108 (90 Base) MCG/ACT inhaler Inhale 2 puffs into the lungs every 4 (four) hours as needed for wheezing or shortness of breath.   aspirin  81 MG tablet Take 1 tablet (81 mg total) daily by mouth.   cyanocobalamin (VITAMIN B12) 1000 MCG tablet Take 1,000 mcg by mouth daily.   doxycycline  (VIBRA -TABS) 100 MG tablet Take 1 tablet (100 mg total) by mouth 2 (two) times daily.   fluticasone -salmeterol (ADVAIR) 250-50 MCG/ACT AEPB Inhale 1 puff into the lungs in the morning and at bedtime.   hydrochlorothiazide  (HYDRODIURIL ) 12.5 MG tablet TAKE 1 TABLET BY MOUTH DAILY   rosuvastatin  (CRESTOR ) 10 MG tablet TAKE 1 TABLET BY MOUTH DAILY   traMADol  (ULTRAM ) 50 MG tablet Take 1 tablet (50 mg total) by mouth every 6 (six) hours as needed for moderate pain (pain score 4-6).   triamcinolone  (KENALOG ) 0.025 % cream Apply 1 Application topically daily as needed.   No facility-administered encounter medications on file as of 02/01/2024.   "

## 2024-02-02 ENCOUNTER — Telehealth: Payer: Self-pay | Admitting: Acute Care

## 2024-02-02 NOTE — Telephone Encounter (Signed)
 Scan read as a LR 2. The 12.9 mm nodule has been stable since 04/2023. 12 month should be fine.  Aortic atherosclerosis and Coronary Artery Calcifications . On Crestor  >> No cardiologist

## 2024-02-02 NOTE — Telephone Encounter (Signed)
 Per Lauraine Lites, NP advise pt that this is his last scan for LCS due to Age 79. Please advise pt to follow up with PCP to see if he would want to continue to monitor with a yearly Chest CT W/O.
